# Patient Record
Sex: Male | Born: 1950
Health system: Southern US, Community
[De-identification: ages and names within clinical notes are randomized; demographics above are authoritative.]

## PROBLEM LIST (undated history)

## (undated) DIAGNOSIS — I2699 Other pulmonary embolism without acute cor pulmonale: Secondary | ICD-10-CM

## (undated) DIAGNOSIS — E7800 Pure hypercholesterolemia, unspecified: Secondary | ICD-10-CM

## (undated) HISTORY — DX: Other pulmonary embolism without acute cor pulmonale: I26.99

## (undated) HISTORY — PX: KNEE SURGERY: SHX244

## (undated) HISTORY — DX: Pure hypercholesterolemia, unspecified: E78.00

---

## 1998-12-16 ENCOUNTER — Ambulatory Visit (HOSPITAL_COMMUNITY): Admission: RE | Admit: 1998-12-16 | Discharge: 1998-12-16 | Payer: Self-pay | Admitting: *Deleted

## 2000-03-22 ENCOUNTER — Ambulatory Visit (HOSPITAL_BASED_OUTPATIENT_CLINIC_OR_DEPARTMENT_OTHER): Admission: RE | Admit: 2000-03-22 | Discharge: 2000-03-22 | Payer: Self-pay | Admitting: General Surgery

## 2009-12-10 ENCOUNTER — Encounter: Admission: RE | Admit: 2009-12-10 | Discharge: 2009-12-10 | Payer: Self-pay | Admitting: Family Medicine

## 2009-12-12 ENCOUNTER — Encounter
Admission: RE | Admit: 2009-12-12 | Discharge: 2010-01-07 | Payer: Self-pay | Source: Home / Self Care | Attending: Family Medicine | Admitting: Family Medicine

## 2013-03-30 ENCOUNTER — Ambulatory Visit
Admission: RE | Admit: 2013-03-30 | Discharge: 2013-03-30 | Disposition: A | Discharge status: Home / Self Care | Payer: BC Managed Care – PPO | Source: Ambulatory Visit | Attending: Family Medicine | Admitting: Family Medicine

## 2013-03-30 ENCOUNTER — Inpatient Hospital Stay (HOSPITAL_COMMUNITY)
Admission: EM | Admit: 2013-03-30 | Discharge: 2013-04-01 | DRG: 176 | Disposition: A | Discharge status: Home / Self Care | Payer: BC Managed Care – PPO | Attending: Internal Medicine | Admitting: Internal Medicine

## 2013-03-30 ENCOUNTER — Other Ambulatory Visit: Payer: Self-pay | Admitting: Family Medicine

## 2013-03-30 ENCOUNTER — Encounter (HOSPITAL_COMMUNITY): Payer: Self-pay | Admitting: Emergency Medicine

## 2013-03-30 DIAGNOSIS — R9389 Abnormal findings on diagnostic imaging of other specified body structures: Secondary | ICD-10-CM

## 2013-03-30 DIAGNOSIS — R05 Cough: Secondary | ICD-10-CM

## 2013-03-30 DIAGNOSIS — M549 Dorsalgia, unspecified: Secondary | ICD-10-CM

## 2013-03-30 DIAGNOSIS — R0781 Pleurodynia: Secondary | ICD-10-CM | POA: Diagnosis present

## 2013-03-30 DIAGNOSIS — Z832 Family history of diseases of the blood and blood-forming organs and certain disorders involving the immune mechanism: Secondary | ICD-10-CM

## 2013-03-30 DIAGNOSIS — R059 Cough, unspecified: Secondary | ICD-10-CM

## 2013-03-30 DIAGNOSIS — R071 Chest pain on breathing: Secondary | ICD-10-CM

## 2013-03-30 DIAGNOSIS — I2699 Other pulmonary embolism without acute cor pulmonale: Principal | ICD-10-CM | POA: Diagnosis present

## 2013-03-30 DIAGNOSIS — R509 Fever, unspecified: Secondary | ICD-10-CM

## 2013-03-30 DIAGNOSIS — R053 Chronic cough: Secondary | ICD-10-CM | POA: Diagnosis present

## 2013-03-30 LAB — BASIC METABOLIC PANEL
BUN: 13 mg/dL (ref 6–23)
CHLORIDE: 103 meq/L (ref 96–112)
CO2: 24 meq/L (ref 19–32)
Calcium: 9.1 mg/dL (ref 8.4–10.5)
Creatinine, Ser: 0.77 mg/dL (ref 0.50–1.35)
GFR calc Af Amer: >90 mL/min (ref >90)
GFR calc non Af Amer: >90 mL/min (ref >90)
GLUCOSE: 115 mg/dL — AB (ref 70–99)
POTASSIUM: 3.8 meq/L (ref 3.7–5.3)
SODIUM: 140 meq/L (ref 137–147)

## 2013-03-30 LAB — CBC
HEMATOCRIT: 36 % — AB (ref 39.0–52.0)
HEMOGLOBIN: 12.1 g/dL — AB (ref 13.0–17.0)
MCH: 29.3 pg (ref 26.0–34.0)
MCHC: 33.6 g/dL (ref 30.0–36.0)
MCV: 87.2 fL (ref 78.0–100.0)
Platelets: 208 10*3/uL (ref 150–400)
RBC: 4.13 MIL/uL — AB (ref 4.22–5.81)
RDW: 13 % (ref 11.5–15.5)
WBC: 7 10*3/uL (ref 4.0–10.5)

## 2013-03-30 LAB — TROPONIN I: Troponin I: <0.3 ng/mL (ref <0.30)

## 2013-03-30 MED ORDER — SODIUM CHLORIDE 0.9 % IJ SOLN
3.0000 mL | Freq: Two times a day (BID) | INTRAMUSCULAR | Status: DC
  Administered 2013-04-01: 3 mL via INTRAVENOUS

## 2013-03-30 MED ORDER — HYDROCODONE-ACETAMINOPHEN 5-325 MG PO TABS
1.0000 | ORAL_TABLET | ORAL | Status: DC | PRN
  Administered 2013-03-31: 1 via ORAL
  Filled 2013-03-30: qty 1

## 2013-03-30 MED ORDER — SODIUM CHLORIDE 0.9 % IV SOLN: 250.0000 mL | INTRAVENOUS | Status: DC | PRN

## 2013-03-30 MED ORDER — SODIUM CHLORIDE 0.9 % IJ SOLN: 3.0000 mL | INTRAMUSCULAR | Status: DC | PRN

## 2013-03-30 MED ORDER — IOHEXOL 300 MG/ML  SOLN
75.0000 mL | Freq: Once | INTRAMUSCULAR | Status: AC | PRN
  Administered 2013-03-30: 75 mL via INTRAVENOUS

## 2013-03-30 MED ORDER — ENOXAPARIN SODIUM 100 MG/ML ~~LOC~~ SOLN
90.0000 mg | Freq: Once | SUBCUTANEOUS | Status: AC
  Administered 2013-03-30: 90 mg via SUBCUTANEOUS
  Filled 2013-03-30: qty 1

## 2013-03-30 MED ORDER — SODIUM CHLORIDE 0.9 % IJ SOLN
3.0000 mL | Freq: Two times a day (BID) | INTRAMUSCULAR | Status: DC
  Administered 2013-03-31 – 2013-04-01 (×3): 3 mL via INTRAVENOUS

## 2013-03-30 MED ORDER — ENOXAPARIN SODIUM 100 MG/ML ~~LOC~~ SOLN
1.0000 mg/kg | Freq: Two times a day (BID) | SUBCUTANEOUS | Status: DC
  Filled 2013-03-30: qty 1

## 2013-03-30 MED ORDER — ENOXAPARIN SODIUM 100 MG/ML ~~LOC~~ SOLN
1.0000 mg/kg | Freq: Two times a day (BID) | SUBCUTANEOUS | Status: DC
  Administered 2013-03-31 – 2013-04-01 (×3): 90 mg via SUBCUTANEOUS
  Filled 2013-03-30 (×4): qty 1

## 2013-03-30 MED ORDER — IBUPROFEN 800 MG PO TABS
400.0000 mg | ORAL_TABLET | Freq: Four times a day (QID) | ORAL | Status: DC | PRN
  Administered 2013-03-31: 400 mg via ORAL
  Filled 2013-03-30: qty 1

## 2013-03-30 NOTE — ED Provider Notes (Signed)
CSN: [id number]     Arrival date & time 03/30/13  1943 History   First MD Initiated Contact with Patient 03/30/13 2113     Chief Complaint  Patient presents with  . Cough  . Back Pain  . pulmonary embolism      (Consider location/radiation/quality/duration/timing/severity/associated sxs/prior Treatment) HPI  History reviewed. No pertinent past medical history. Past Surgical History  Procedure Laterality Date  . Knee surgery      right   No family history on file. History  Substance Use Topics  . Smoking status: Never Smoker   . Smokeless tobacco: Never Used  . Alcohol Use: No    Review of Systems    Allergies  Penicillins  Home Medications   Current Outpatient Rx  Name  Route  Sig  Dispense  Refill  . B Complex-C (B-COMPLEX WITH VITAMIN C) tablet   Oral   Take 1 tablet by mouth daily.         Marland Kitchen ibuprofen (ADVIL,MOTRIN) 200 MG tablet   Oral   Take 400 mg by mouth every 6 (six) hours as needed for moderate pain.         . Multiple Vitamin (MULTIVITAMIN WITH MINERALS) TABS tablet   Oral   Take 1 tablet by mouth daily.         . multivitamin-lutein (OCUVITE-LUTEIN) CAPS capsule   Oral   Take 1 capsule by mouth daily.          BP 131/78  Pulse 75  Temp(Src) 99.1 F (37.3 C) (Oral)  Resp 22  Wt 195 lb (88.451 kg)  SpO2 98% Physical Exam  ED Course  Procedures (including critical care time) Labs Review Labs Reviewed  CBC - Abnormal; Notable for the following:    RBC 4.13 (*)    Hemoglobin 12.1 (*)    HCT 36.0 (*)    All other components within normal limits  BASIC METABOLIC PANEL - Abnormal; Notable for the following:    Glucose, Bld 115 (*)    All other components within normal limits  TROPONIN I   Imaging Review Dg Chest 2 View  03/30/2013   ADDENDUM REPORT: 03/30/2013 10:13  ADDENDUM: Correction to the impression:  The first impression should read "Streaky opacity at the left lung base with small left effusion. Cannot exclude  pneumonia. Recommend followup chest x-ray or CT of the chest. "  The second impression is correct.   Electronically Signed   By: Ivar Drape M.D.   On: 03/30/2013 10:13   03/30/2013   CLINICAL DATA:  Cough, intermittent fever for 6 weeks  EXAM: CHEST  2 VIEW  COMPARISON:  None.  FINDINGS: There is streaky opacity at the left lung base most consistent with atelectasis. The does appear to be a small left effusion, and developing pneumonia cannot be excluded. Followup is recommended. Also, there is a vague nodular opacity in the periphery of the right mid lung. This may represent a pleural plaque, but a lung nodule cannot be excluded. In view of this finding CT of the chest with IV contrast may be warranted. The right lung is clear. Mediastinal contours appear normal. The heart is within normal limits in size.  IMPRESSION: 1. Streaky opacity at the left lung base with small left effusion. Cannot exclude a body. Recommend followup. 2. Vague nodular opacity in the periphery of the right mid lung. Recommend CT of the chest with IV contrast to evaluate further.  Electronically Signed: By: Ivar Drape M.D. On:  03/30/2013 09:39   Ct Chest W Contrast  03/30/2013   CLINICAL DATA:  Fever with cough. Abnormal chest radiograph. Question pneumonia.  EXAM: CT CHEST WITH CONTRAST  TECHNIQUE: Multidetector CT imaging of the chest was performed during intravenous contrast administration.  CONTRAST:  51mL OMNIPAQUE IOHEXOL 300 MG/ML  SOLN  COMPARISON:  DG CHEST 2 VIEW dated 03/30/2013  FINDINGS: There are no enlarged mediastinal, hilar or axillary lymph nodes. There is very mild atherosclerosis. The heart size is normal.  Although not performed as a CTA, there is evidence of bilateral pulmonary embolism, more extensive on the right. The intravascular filling defects are somewhat ill-defined and peripheral and may be subacute. There are no signs of right heart strain.  There is no pericardial effusion. A small amount of pleural fluid is  present on the right. There are patchy airspace opacities at both lung bases which are primarily linear, likely representing atelectasis or scarring. There is some dependent high-density within the right lower lobe which could be due to chronic aspiration. Peripheral subpleural airspace disease within the superior segment of the right lower lobe is somewhat wedge-shaped and most likely represents a pulmonary infarct related to the patient's thromboembolic disease. There is no evidence of mass or endobronchial lesion.  The visualized upper abdomen appears unremarkable. There are no worrisome osseous findings.  IMPRESSION: 1. Acute or subacute bilateral pulmonary thromboembolic disease. 2. Pulmonary infarct in the right lower lobe. 3. Patchy airspace opacities at both lung bases are primarily linear and may reflect atelectasis or scarring. There is some dependent high-density in the right lower lobe which could be due to chronic aspiration. Radiographic follow up recommended. 4. Critical Value/emergent results were called by telephone at the time of interpretation on 03/30/2013 at 6:57 PM to Dr. Darcus Austin , who verbally acknowledged these results.   Electronically Signed   By: Camie Patience M.D.   On: 03/30/2013 18:58     EKG Interpretation   Date/Time:  Thursday March 30 2013 20:53:48 EST Ventricular Rate:  76 PR Interval:  180 QRS Duration: 105 QT Interval:  405 QTC Calculation: 455 R Axis:   58 Text Interpretation:  Sinus rhythm No old tracing to compare Confirmed by  CAMPOS  MD, Lennette Bihari ([id number]) on 03/30/2013 9:07:25 PM      MDM   Final diagnoses:  PE (pulmonary embolism)    Pt given lovenox.   I spoke to Dr. Shanon Brow who will admit    Fransico Meadow, PA-C 03/30/13 2259

## 2013-03-30 NOTE — ED Notes (Signed)
Pt states since end of December has had a dry cough, lower back pain, chills and fever on and off, states this past week the symptoms came back, states went to doctor and had chest xray done and CT scan, states doctor called and told him he had a PE and to come to the hospital, denies shortness of breath except when coughing, states is sore on R side when coughing.

## 2013-03-30 NOTE — H&P (Signed)
Chief Complaint:  Cough, cp  HPI: 63 yo healthy male went to see pcp for cough that has been waxing/waning for at least 4 months and worsening right sided chest pain posterior side radiates to anterior lower rt chest worse with inspiration was sent for cxr.  cxr was abnormal and recommended ct.  Ct scan was done as outpt which showed bilateral PE more on right than left.  Pt states this all started a couple days after he traveled to [place] (about 4 hour drive) the week after christmas.  He has been having this persistent cough, no hemoptysis.  Having chills occasionally, and sometimes fever for past 3 months.  No le edema, swelling or pain.  No major injuries or trauma.  No recent surgeries.  Pt has screening colonoscopy done last year he reports was normal.  Pt has 2 brothers and a nephew with h/o blood clots, he found out today that they all have Factor V leiden deficiency.  He also has a maternal grandfather who died of a massive blood clot in his 66s.  Pt has 3 children, 2 sons one daughter none of had blood clots yet.  Pt has no h/o vte, cva, or cad.  He did have knee surgery several years ago with no postoperative complications.  He did see his allergist within the last month for his cough, was given some prednisone which did not help.  Denies any sob.  Is very active, exercises up to 3 x a week on eliptical machine.  Review of Systems:  Positive and negative as per HPI otherwise all other systems are negative  Past Medical History: History reviewed. No pertinent past medical history. Past Surgical History  Procedure Laterality Date  . Knee surgery      right    Medications: Prior to Admission medications   Medication Sig Start Date End Date Taking? Authorizing Provider  B Complex-C (B-COMPLEX WITH VITAMIN C) tablet Take 1 tablet by mouth daily.   Yes Historical Provider, MD  ibuprofen (ADVIL,MOTRIN) 200 MG tablet Take 400 mg by mouth every 6 (six) hours as needed for moderate  pain.   Yes Historical Provider, MD  Multiple Vitamin (MULTIVITAMIN WITH MINERALS) TABS tablet Take 1 tablet by mouth daily.   Yes Historical Provider, MD  multivitamin-lutein (OCUVITE-LUTEIN) CAPS capsule Take 1 capsule by mouth daily.   Yes Historical Provider, MD    Allergies:   Allergies  Allergen Reactions  . Penicillins     "childhood reaction from mother"    Social History:  reports that he has never smoked. He has never used smokeless tobacco. He reports that he does not drink alcohol or use illicit drugs.  Family History: As above  Physical Exam: Filed Vitals:   03/30/13 1948 03/30/13 2058 03/30/13 2210 03/30/13 2219  BP: 131/78     Pulse: 83 75 75   Temp: 99.1 F (37.3 C)     TempSrc: Oral     Resp: 18 15 22    Weight:    88.451 kg (195 lb)  SpO2: 96% 100% 98%    General appearance: alert, cooperative and no distress Head: Normocephalic, without obvious abnormality, atraumatic Eyes: negative Nose: Nares normal. Septum midline. Mucosa normal. No drainage or sinus tenderness. Neck: no JVD and supple, symmetrical, trachea midline Lungs: clear to auscultation bilaterally Heart: regular rate and rhythm, S1, S2 normal, no murmur, click, rub or gallop Abdomen: soft, non-tender; bowel sounds normal; no masses,  no organomegaly Extremities: extremities normal, atraumatic, no cyanosis  or edema Pulses: 2+ and symmetric Skin: Skin color, texture, turgor normal. No rashes or lesions Neurologic: Grossly normal  Labs on Admission:   Recent Labs  03/30/13 2100  NA 140  K 3.8  CL 103  CO2 24  GLUCOSE 115*  BUN 13  CREATININE 0.77  CALCIUM 9.1    Recent Labs  03/30/13 2100  WBC 7.0  HGB 12.1*  HCT 36.0*  MCV 87.2  PLT 208    Recent Labs  03/30/13 2100  TROPONINI <0.30    Radiological Exams on Admission: Dg Chest 2 View  03/30/2013   ADDENDUM REPORT: 03/30/2013 10:13  ADDENDUM: Correction to the impression:  The first impression should read "Streaky  opacity at the left lung base with small left effusion. Cannot exclude pneumonia. Recommend followup chest x-ray or CT of the chest. "  The second impression is correct.   Electronically Signed   By: Ivar Drape M.D.   On: 03/30/2013 10:13   03/30/2013   CLINICAL DATA:  Cough, intermittent fever for 6 weeks  EXAM: CHEST  2 VIEW  COMPARISON:  None.  FINDINGS: There is streaky opacity at the left lung base most consistent with atelectasis. The does appear to be a small left effusion, and developing pneumonia cannot be excluded. Followup is recommended. Also, there is a vague nodular opacity in the periphery of the right mid lung. This may represent a pleural plaque, but a lung nodule cannot be excluded. In view of this finding CT of the chest with IV contrast may be warranted. The right lung is clear. Mediastinal contours appear normal. The heart is within normal limits in size.  IMPRESSION: 1. Streaky opacity at the left lung base with small left effusion. Cannot exclude a body. Recommend followup. 2. Vague nodular opacity in the periphery of the right mid lung. Recommend CT of the chest with IV contrast to evaluate further.  Electronically Signed: By: Ivar Drape M.D. On: 03/30/2013 09:39   Ct Chest W Contrast  03/30/2013   CLINICAL DATA:  Fever with cough. Abnormal chest radiograph. Question pneumonia.  EXAM: CT CHEST WITH CONTRAST  TECHNIQUE: Multidetector CT imaging of the chest was performed during intravenous contrast administration.  CONTRAST:  71mL OMNIPAQUE IOHEXOL 300 MG/ML  SOLN  COMPARISON:  DG CHEST 2 VIEW dated 03/30/2013  FINDINGS: There are no enlarged mediastinal, hilar or axillary lymph nodes. There is very mild atherosclerosis. The heart size is normal.  Although not performed as a CTA, there is evidence of bilateral pulmonary embolism, more extensive on the right. The intravascular filling defects are somewhat ill-defined and peripheral and may be subacute. There are no signs of right heart  strain.  There is no pericardial effusion. A small amount of pleural fluid is present on the right. There are patchy airspace opacities at both lung bases which are primarily linear, likely representing atelectasis or scarring. There is some dependent high-density within the right lower lobe which could be due to chronic aspiration. Peripheral subpleural airspace disease within the superior segment of the right lower lobe is somewhat wedge-shaped and most likely represents a pulmonary infarct related to the patient's thromboembolic disease. There is no evidence of mass or endobronchial lesion.  The visualized upper abdomen appears unremarkable. There are no worrisome osseous findings.  IMPRESSION: 1. Acute or subacute bilateral pulmonary thromboembolic disease. 2. Pulmonary infarct in the right lower lobe. 3. Patchy airspace opacities at both lung bases are primarily linear and may reflect atelectasis or scarring. There is  some dependent high-density in the right lower lobe which could be due to chronic aspiration. Radiographic follow up recommended. 4. Critical Value/emergent results were called by telephone at the time of interpretation on 03/30/2013 at 6:57 PM to Dr. Darcus Austin , who verbally acknowledged these results.   Electronically Signed   By: Camie Patience M.D.   On: 03/30/2013 18:58    Assessment/Plan  63 yo male with acute /subacute bilateral pulmonary emboli more on right than left with over 3 months of cough/pleuritic chest pain with probable factor V leiden deficiency   Principal Problem:   Pulmonary emboli-  Given his family history, will likely need lifelong anticoagulation. im actually surprised he has not clotted sooner.  Place on full dose lovenox.  Would be a good xaralto candidate if cost is not an issue.  sw will need to evaluate this prior to discharge.  hypercoag panel as been sent off prior to giving lovenox, but even if comes back negative he should probably still be on lifelong  anticoagulation.  Have advised him also that his children should get tested if indeed his 2 brothers and nephew have factor V leiden deficiency, as well as his younger brother who has not been tested yet.  vss all stable.  Place on tele.  Will require at least 48 hour hospitalization.  All of his cancer screening will need to make sure is up to date, his colon is, not sure about prostate.  Active Problems:   Pleuritic chest pain   Cough, persistent   FHx: factor V Leiden mutation  FULL CODE  DAVID,RACHAL A 03/30/2013, 11:36 PM

## 2013-03-30 NOTE — ED Notes (Signed)
Hospitalist at bedside 

## 2013-03-30 NOTE — ED Provider Notes (Signed)
CSN: [id number]     Arrival date & time 03/30/13  1943 History   None    Chief Complaint  Patient presents with  . Cough  . Back Pain  . pulmonary embolism      (Consider location/radiation/quality/duration/timing/severity/associated sxs/prior Treatment) Patient is a 63 y.o. male presenting with cough and back pain. The history is provided by the patient. No language interpreter was used.  Cough Cough characteristics:  Non-productive Severity:  Moderate Timing:  Constant Progression:  Worsening Chronicity:  New Smoker: no   Context: upper respiratory infection   Relieved by:  Nothing Worsened by:  Nothing tried Ineffective treatments:  None tried Associated symptoms: shortness of breath   Back Pain Pt reports he began having pain in his back on Monday.  Pt reports he has had a cough on and off since December.  Pt reports he was treated with antibiotics and prednisone.  Pt went to Md today who ordered chest xray which was abnormal.  Pt's pulse ox was low in office.   Pt sent for a ct which shows a PE.   Pt reports 2 brothers and a nephew have Factor V leiden.   Pt reports father died from a blood clot  History reviewed. No pertinent past medical history. Past Surgical History  Procedure Laterality Date  . Knee surgery      right   No family history on file. History  Substance Use Topics  . Smoking status: Never Smoker   . Smokeless tobacco: Never Used  . Alcohol Use: No    Review of Systems  Respiratory: Positive for cough and shortness of breath.   Musculoskeletal: Positive for back pain.  All other systems reviewed and are negative.      Allergies  Penicillins  Home Medications   Current Outpatient Rx  Name  Route  Sig  Dispense  Refill  . B Complex-C (B-COMPLEX WITH VITAMIN C) tablet   Oral   Take 1 tablet by mouth daily.         Marland Kitchen ibuprofen (ADVIL,MOTRIN) 200 MG tablet   Oral   Take 400 mg by mouth every 6 (six) hours as needed for moderate pain.          . Multiple Vitamin (MULTIVITAMIN WITH MINERALS) TABS tablet   Oral   Take 1 tablet by mouth daily.         . multivitamin-lutein (OCUVITE-LUTEIN) CAPS capsule   Oral   Take 1 capsule by mouth daily.          BP 131/78  Pulse 75  Temp(Src) 99.1 F (37.3 C) (Oral)  Resp 15  SpO2 100% Physical Exam  Nursing note and vitals reviewed. Constitutional: He is oriented to person, place, and time. He appears well-developed and well-nourished.  HENT:  Head: Normocephalic and atraumatic.  Eyes: Conjunctivae and EOM are normal. Pupils are equal, round, and reactive to light.  Neck: Normal range of motion.  Cardiovascular: Normal rate and normal heart sounds.   Pulmonary/Chest: Effort normal.  Abdominal: He exhibits no distension.  Musculoskeletal: Normal range of motion.  Neurological: He is alert and oriented to person, place, and time.  Skin: Skin is warm.  Psychiatric: He has a normal mood and affect.    ED Course  Procedures (including critical care time) Labs Review Labs Reviewed  CBC  BASIC METABOLIC PANEL   Imaging Review Dg Chest 2 View  03/30/2013   ADDENDUM REPORT: 03/30/2013 10:13  ADDENDUM: Correction to the impression:  The first  impression should read "Streaky opacity at the left lung base with small left effusion. Cannot exclude pneumonia. Recommend followup chest x-ray or CT of the chest. "  The second impression is correct.   Electronically Signed   By: Ivar Drape M.D.   On: 03/30/2013 10:13   03/30/2013   CLINICAL DATA:  Cough, intermittent fever for 6 weeks  EXAM: CHEST  2 VIEW  COMPARISON:  None.  FINDINGS: There is streaky opacity at the left lung base most consistent with atelectasis. The does appear to be a small left effusion, and developing pneumonia cannot be excluded. Followup is recommended. Also, there is a vague nodular opacity in the periphery of the right mid lung. This may represent a pleural plaque, but a lung nodule cannot be excluded. In  view of this finding CT of the chest with IV contrast may be warranted. The right lung is clear. Mediastinal contours appear normal. The heart is within normal limits in size.  IMPRESSION: 1. Streaky opacity at the left lung base with small left effusion. Cannot exclude a body. Recommend followup. 2. Vague nodular opacity in the periphery of the right mid lung. Recommend CT of the chest with IV contrast to evaluate further.  Electronically Signed: By: Ivar Drape M.D. On: 03/30/2013 09:39   Ct Chest W Contrast  03/30/2013   CLINICAL DATA:  Fever with cough. Abnormal chest radiograph. Question pneumonia.  EXAM: CT CHEST WITH CONTRAST  TECHNIQUE: Multidetector CT imaging of the chest was performed during intravenous contrast administration.  CONTRAST:  74mL OMNIPAQUE IOHEXOL 300 MG/ML  SOLN  COMPARISON:  DG CHEST 2 VIEW dated 03/30/2013  FINDINGS: There are no enlarged mediastinal, hilar or axillary lymph nodes. There is very mild atherosclerosis. The heart size is normal.  Although not performed as a CTA, there is evidence of bilateral pulmonary embolism, more extensive on the right. The intravascular filling defects are somewhat ill-defined and peripheral and may be subacute. There are no signs of right heart strain.  There is no pericardial effusion. A small amount of pleural fluid is present on the right. There are patchy airspace opacities at both lung bases which are primarily linear, likely representing atelectasis or scarring. There is some dependent high-density within the right lower lobe which could be due to chronic aspiration. Peripheral subpleural airspace disease within the superior segment of the right lower lobe is somewhat wedge-shaped and most likely represents a pulmonary infarct related to the patient's thromboembolic disease. There is no evidence of mass or endobronchial lesion.  The visualized upper abdomen appears unremarkable. There are no worrisome osseous findings.  IMPRESSION: 1. Acute or  subacute bilateral pulmonary thromboembolic disease. 2. Pulmonary infarct in the right lower lobe. 3. Patchy airspace opacities at both lung bases are primarily linear and may reflect atelectasis or scarring. There is some dependent high-density in the right lower lobe which could be due to chronic aspiration. Radiographic follow up recommended. 4. Critical Value/emergent results were called by telephone at the time of interpretation on 03/30/2013 at 6:57 PM to Dr. Darcus Austin , who verbally acknowledged these results.   Electronically Signed   By: Camie Patience M.D.   On: 03/30/2013 18:58     EKG Interpretation   Date/Time:  Thursday March 30 2013 20:53:48 EST Ventricular Rate:  76 PR Interval:  180 QRS Duration: 105 QT Interval:  405 QTC Calculation: 455 R Axis:   58 Text Interpretation:  Sinus rhythm No old tracing to compare Confirmed by  CAMPOS  MD, Caryn Bee ([id number]) on 03/30/2013 9:07:25 PM     Results for orders placed during the hospital encounter of 03/30/13  CBC      Result Value Ref Range   WBC 7.0  4.0 - 10.5 K/uL   RBC 4.13 (*) 4.22 - 5.81 MIL/uL   Hemoglobin 12.1 (*) 13.0 - 17.0 g/dL   HCT 90.2 (*) 40.9 - 73.5 %   MCV 87.2  78.0 - 100.0 fL   MCH 29.3  26.0 - 34.0 pg   MCHC 33.6  30.0 - 36.0 g/dL   RDW 32.9  92.4 - 26.8 %   Platelets 208  150 - 400 K/uL  BASIC METABOLIC PANEL      Result Value Ref Range   Sodium 140  137 - 147 mEq/L   Potassium 3.8  3.7 - 5.3 mEq/L   Chloride 103  96 - 112 mEq/L   CO2 24  19 - 32 mEq/L   Glucose, Bld 115 (*) 70 - 99 mg/dL   BUN 13  6 - 23 mg/dL   Creatinine, Ser 3.41  0.50 - 1.35 mg/dL   Calcium 9.1  8.4 - 96.2 mg/dL   GFR calc non Af Amer >90  >90 mL/min   GFR calc Af Amer >90  >90 mL/min   Dg Chest 2 View  03/30/2013   ADDENDUM REPORT: 03/30/2013 10:13  ADDENDUM: Correction to the impression:  The first impression should read "Streaky opacity at the left lung base with small left effusion. Cannot exclude pneumonia. Recommend followup  chest x-ray or CT of the chest. "  The second impression is correct.   Electronically Signed   By: Dwyane Dee M.D.   On: 03/30/2013 10:13   03/30/2013   CLINICAL DATA:  Cough, intermittent fever for 6 weeks  EXAM: CHEST  2 VIEW  COMPARISON:  None.  FINDINGS: There is streaky opacity at the left lung base most consistent with atelectasis. The does appear to be a small left effusion, and developing pneumonia cannot be excluded. Followup is recommended. Also, there is a vague nodular opacity in the periphery of the right mid lung. This may represent a pleural plaque, but a lung nodule cannot be excluded. In view of this finding CT of the chest with IV contrast may be warranted. The right lung is clear. Mediastinal contours appear normal. The heart is within normal limits in size.  IMPRESSION: 1. Streaky opacity at the left lung base with small left effusion. Cannot exclude a body. Recommend followup. 2. Vague nodular opacity in the periphery of the right mid lung. Recommend CT of the chest with IV contrast to evaluate further.  Electronically Signed: By: Dwyane Dee M.D. On: 03/30/2013 09:39   Ct Chest W Contrast  03/30/2013   CLINICAL DATA:  Fever with cough. Abnormal chest radiograph. Question pneumonia.  EXAM: CT CHEST WITH CONTRAST  TECHNIQUE: Multidetector CT imaging of the chest was performed during intravenous contrast administration.  CONTRAST:  6mL OMNIPAQUE IOHEXOL 300 MG/ML  SOLN  COMPARISON:  DG CHEST 2 VIEW dated 03/30/2013  FINDINGS: There are no enlarged mediastinal, hilar or axillary lymph nodes. There is very mild atherosclerosis. The heart size is normal.  Although not performed as a CTA, there is evidence of bilateral pulmonary embolism, more extensive on the right. The intravascular filling defects are somewhat ill-defined and peripheral and may be subacute. There are no signs of right heart strain.  There is no pericardial effusion. A small amount of pleural fluid is  present on the right. There  are patchy airspace opacities at both lung bases which are primarily linear, likely representing atelectasis or scarring. There is some dependent high-density within the right lower lobe which could be due to chronic aspiration. Peripheral subpleural airspace disease within the superior segment of the right lower lobe is somewhat wedge-shaped and most likely represents a pulmonary infarct related to the patient's thromboembolic disease. There is no evidence of mass or endobronchial lesion.  The visualized upper abdomen appears unremarkable. There are no worrisome osseous findings.  IMPRESSION: 1. Acute or subacute bilateral pulmonary thromboembolic disease. 2. Pulmonary infarct in the right lower lobe. 3. Patchy airspace opacities at both lung bases are primarily linear and may reflect atelectasis or scarring. There is some dependent high-density in the right lower lobe which could be due to chronic aspiration. Radiographic follow up recommended. 4. Critical Value/emergent results were called by telephone at the time of interpretation on 03/30/2013 at 6:57 PM to Dr. Darcus Austin , who verbally acknowledged these results.   Electronically Signed   By: Camie Patience M.D.   On: 03/30/2013 18:58    MDM   Final diagnoses:  PE (pulmonary embolism)       Fransico Meadow, PA-C 03/30/13 [place], [place] 03/30/13 2259

## 2013-03-30 NOTE — ED Notes (Signed)
Bed: WA16 Expected date:  Expected time:  Means of arrival:  Comments: TR 1 

## 2013-03-31 LAB — HOMOCYSTEINE: Homocysteine: 8.1 umol/L (ref 4.0–15.4)

## 2013-03-31 LAB — BASIC METABOLIC PANEL
BUN: 13 mg/dL (ref 6–23)
CHLORIDE: 102 meq/L (ref 96–112)
CO2: 23 mEq/L (ref 19–32)
Calcium: 9 mg/dL (ref 8.4–10.5)
Creatinine, Ser: 0.79 mg/dL (ref 0.50–1.35)
GFR calc non Af Amer: >90 mL/min (ref >90)
Glucose, Bld: 107 mg/dL — ABNORMAL HIGH (ref 70–99)
Potassium: 3.9 mEq/L (ref 3.7–5.3)
Sodium: 138 mEq/L (ref 137–147)

## 2013-03-31 LAB — LUPUS ANTICOAGULANT PANEL
DRVVT: 45.1 s — AB (ref <42.9)
LUPUS ANTICOAGULANT: NOT DETECTED
PTT Lupus Anticoagulant: 37.4 secs (ref 28.0–43.0)
dRVVT Incubated 1:1 Mix: 38.2 secs (ref <42.9)

## 2013-03-31 LAB — CBC
HEMATOCRIT: 37.1 % — AB (ref 39.0–52.0)
Hemoglobin: 12.2 g/dL — ABNORMAL LOW (ref 13.0–17.0)
MCH: 28.7 pg (ref 26.0–34.0)
MCHC: 32.9 g/dL (ref 30.0–36.0)
MCV: 87.3 fL (ref 78.0–100.0)
PLATELETS: 241 10*3/uL (ref 150–400)
RBC: 4.25 MIL/uL (ref 4.22–5.81)
RDW: 12.8 % (ref 11.5–15.5)
WBC: 6.6 10*3/uL (ref 4.0–10.5)

## 2013-03-31 LAB — PROTEIN C ACTIVITY: PROTEIN C ACTIVITY: 121 % (ref 75–133)

## 2013-03-31 LAB — FACTOR 5 LEIDEN

## 2013-03-31 LAB — BETA-2-GLYCOPROTEIN I ABS, IGG/M/A
BETA 2 GLYCO I IGG: 2 G Units (ref <20)
Beta-2-Glycoprotein I IgA: 37 A Units — ABNORMAL HIGH (ref <20)
Beta-2-Glycoprotein I IgM: 51 M Units — ABNORMAL HIGH (ref <20)

## 2013-03-31 LAB — CARDIOLIPIN ANTIBODIES, IGG, IGM, IGA
ANTICARDIOLIPIN IGA: 6 U/mL — AB (ref <22)
ANTICARDIOLIPIN IGM: 20 [MPL'U]/mL — AB (ref <11)
Anticardiolipin IgG: 4 GPL U/mL — ABNORMAL LOW (ref <23)

## 2013-03-31 LAB — PROTIME-INR
INR: 1.07 (ref 0.00–1.49)
PROTHROMBIN TIME: 13.7 s (ref 11.6–15.2)

## 2013-03-31 LAB — PROTEIN S, TOTAL: Protein S Ag, Total: 78 % (ref 60–150)

## 2013-03-31 LAB — PROTEIN C, TOTAL: Protein C, Total: 76 % (ref 72–160)

## 2013-03-31 LAB — PROTEIN S ACTIVITY: Protein S Activity: 91 % (ref 69–129)

## 2013-03-31 LAB — ANTITHROMBIN III: AntiThromb III Func: 113 % (ref 76–126)

## 2013-03-31 MED ORDER — BENZONATATE 100 MG PO CAPS
200.0000 mg | ORAL_CAPSULE | Freq: Three times a day (TID) | ORAL | Status: DC | PRN
  Filled 2013-03-31: qty 2

## 2013-03-31 MED ORDER — PATIENT'S GUIDE TO USING COUMADIN BOOK
Freq: Once | Status: DC
  Filled 2013-03-31: qty 1

## 2013-03-31 MED ORDER — COUMADIN BOOK
Freq: Once | Status: AC
  Administered 2013-03-31: 17:00:00
  Filled 2013-03-31: qty 1

## 2013-03-31 NOTE — Progress Notes (Signed)
Education provided to patient and spouse about giving Lovenox injection.  Patient able to successful demonstrate and give his own Lovenox injection. Spencer Ross

## 2013-03-31 NOTE — Progress Notes (Signed)
Lovenox coverage for pt checked:PT HAS TO MEET $5500-DEDUCTIBLE-PT HAS ONLY MET $468-AFTER DEDUCTIBLE IS MET PAYER WILL COVER _0 %

## 2013-03-31 NOTE — Progress Notes (Signed)
Patient ID: Spencer Ross, male   DOB: 1950/09/11, 63 y.o.   MRN: [id number]  [facility] PROGRESS NOTE  Spencer Ross DZH:[id number] DOB: [birth year] DOA: 03/30/2013 PCP: No PCP Per Patient  Brief narrative: 63 yo healthy male went to see pcp for cough that has been waxing/waning for at least 4 months and worsening right sided chest pain posterior side, radiates to anterior lower rt chest worse with inspiration, was sent for cxr. CXR was abnormal and recommended CT. Ct scan was done as outpt which showed bilateral PE more on right than left. Pt has rather strong family history of PE.   Principal Problem:   Pulmonary emboli - pt started on Lovenox and tolerating well - no signs of bleeding - discussed anticoagulation options upon discharge, details provided and pt verbalized understanding  - pt will make decision over the next 24 hours Active Problems:   Pleuritic chest pain - secondary to PE - continue analgesia as needed   Cough, persistent - possibly related to PE but ? Underlying PNA - will ask for sputum analysis - will consider ABX if no improvement    FHx: factor V Leiden mutation - blood work pending   Consultants:  None  Procedures/Studies: 03/30/2013 CXR Streaky opacity at the left lung base with small left effusion, ? PNA. Vague nodular opacity in the periphery of the right mid lung.   Ct Chest W Contrast   03/30/2013    1. Acute or subacute bilateral pulmonary thromboembolic disease.  2. Pulmonary infarct in the right lower lobe.  3. Patchy airspace opacities at both lung bases are primarily linear and may reflect atelectasis or scarring.  4. There is some dependent high-density in the right lower lobe which could be due to chronic aspiration. Radiographic follow up recommended.  Antibiotics:  None  Code Status: Full Family Communication: Pt and wife at bedside Disposition Plan: Home when medically stable  HPI/Subjective: No events overnight.    Objective: Filed Vitals:   03/30/13 2348 03/30/13 2353 03/31/13 0543 03/31/13 1455  BP: 133/79 123/84 116/75 127/76  Pulse: 77 85 52 73  Temp:   98.3 F (36.8 C) 98.4 F (36.9 C)  TempSrc:   Oral Oral  Resp:   20 18  Height:      Weight:      SpO2:   97% 97%    Intake/Output Summary (Last 24 hours) at 03/31/13 1657 Last data filed at 03/31/13 1600  Gross per 24 hour  Intake    500 ml  Output    650 ml  Net   -150 ml    Exam:   General:  Pt is alert, follows commands appropriately, not in acute distress  Cardiovascular: Regular rate and rhythm, S1/S2, no murmurs, no rubs, no gallops  Respiratory: Clear to auscultation bilaterally, no wheezing, no crackles, no rhonchi  Abdomen: Soft, non tender, non distended, bowel sounds present, no guarding  Extremities: No edema, pulses DP and PT palpable bilaterally  Neuro: Grossly nonfocal  Data Reviewed: Basic Metabolic Panel:  Recent Labs Lab 03/30/13 2100 03/31/13 0341  NA 140 138  K 3.8 3.9  CL 103 102  CO2 24 23  GLUCOSE 115* 107*  BUN 13 13  CREATININE 0.77 0.79  CALCIUM 9.1 9.0   CBC:  Recent Labs Lab 03/30/13 2100 03/31/13 0341  WBC 7.0 6.6  HGB 12.1* 12.2*  HCT 36.0* 37.1*  MCV 87.2 87.3  PLT 208 241   Cardiac Enzymes:  Recent Labs Lab  03/30/13 2100  TROPONINI <0.30   Scheduled Meds: . enoxaparin injection  1 mg/kg Subcutaneous Q12H  . sodium chloride  3 mL Intravenous Q12H  . sodium chloride  3 mL Intravenous Q12H   Continuous Infusions:  Faye Ramsay, MD  [facility] Pager [phone]  If 7PM-7AM, please contact night-coverage [email or web address] Password [email or web address] 03/31/2013, 4:57 PM   LOS: 1 day

## 2013-04-01 LAB — CBC
HCT: 39.7 % (ref 39.0–52.0)
Hemoglobin: 13 g/dL (ref 13.0–17.0)
MCH: 28.8 pg (ref 26.0–34.0)
MCHC: 32.7 g/dL (ref 30.0–36.0)
MCV: 87.8 fL (ref 78.0–100.0)
PLATELETS: 260 10*3/uL (ref 150–400)
RBC: 4.52 MIL/uL (ref 4.22–5.81)
RDW: 12.9 % (ref 11.5–15.5)
WBC: 7.5 10*3/uL (ref 4.0–10.5)

## 2013-04-01 LAB — BASIC METABOLIC PANEL
BUN: 14 mg/dL (ref 6–23)
CALCIUM: 9.3 mg/dL (ref 8.4–10.5)
CO2: 25 mEq/L (ref 19–32)
CREATININE: 0.84 mg/dL (ref 0.50–1.35)
Chloride: 102 mEq/L (ref 96–112)
Glucose, Bld: 103 mg/dL — ABNORMAL HIGH (ref 70–99)
Potassium: 4.1 mEq/L (ref 3.7–5.3)
Sodium: 139 mEq/L (ref 137–147)

## 2013-04-01 MED ORDER — RIVAROXABAN 15 MG PO TABS
15.0000 mg | ORAL_TABLET | Freq: Two times a day (BID) | ORAL | Status: DC
  Filled 2013-04-01 (×2): qty 1

## 2013-04-01 MED ORDER — RIVAROXABAN 15 MG PO TABS: 15.0000 mg | ORAL_TABLET | Freq: Two times a day (BID) | ORAL | Status: DC

## 2013-04-01 MED ORDER — RIVAROXABAN 20 MG PO TABS: 20.0000 mg | ORAL_TABLET | Freq: Every day | ORAL | Status: DC

## 2013-04-01 MED ORDER — RIVAROXABAN (XARELTO) EDUCATION KIT FOR DVT/PE PATIENTS
PACK | Freq: Once | Status: AC
  Administered 2013-04-01: 12:00:00
  Filled 2013-04-01: qty 1

## 2013-04-01 MED ORDER — BENZONATATE 200 MG PO CAPS
200.0000 mg | ORAL_CAPSULE | Freq: Two times a day (BID) | ORAL | Status: DC
Start: 2013-04-01 — End: 2013-05-18

## 2013-04-01 MED ORDER — BENZONATATE 100 MG PO CAPS
200.0000 mg | ORAL_CAPSULE | Freq: Two times a day (BID) | ORAL | Status: DC
  Administered 2013-04-01: 200 mg via ORAL
  Filled 2013-04-01 (×2): qty 2

## 2013-04-01 NOTE — Progress Notes (Signed)
Pharmacy Note - Changing Lovenox to Xarelto  A/P: Patient does not have any contraindications to using Xarelto. Patient received Lovenox 1mg /kg at 10am this AM. Will start Xarelto 15mg  BID x 21 days with first dose due tonight at 10pm. After finished with 3 weeks of BID dosing, start 20mg  once daily. Will counsel patient on [place], PharmD, BCPS Pager [phone] 04/01/2013 11:35 AM

## 2013-04-01 NOTE — Discharge Summary (Signed)
Physician Discharge Summary  Spencer Ross:[id number] DOB: Sep 20, 1950 DOA: 03/30/2013  PCP: Darcus Austin  Admit date: 03/30/2013 Discharge date: 04/01/2013  Recommendations for Outpatient Follow-up:  1. Pt will need to follow up with PCP in 2-3 weeks post discharge 2. Please obtain BMP to evaluate electrolytes and kidney function 3. Please also check CBC to evaluate Hg and Hct levels  Discharge Diagnoses: Pulmonary emboli  Principal Problem:   Pulmonary emboli Active Problems:   Pleuritic chest pain   Cough, persistent   FHx: factor V Leiden mutation  Discharge Condition: Stable  Diet recommendation: Heart healthy diet discussed in details   Brief narrative:  63 yo healthy male went to see pcp for cough that has been waxing/waning for at least 4 months and worsening right sided chest pain posterior side, radiates to anterior lower rt chest worse with inspiration, was sent for cxr. CXR was abnormal and recommended CT. Ct scan was done as outpt which showed bilateral PE more on right than left. Pt has rather strong family history of PE.   Principal Problem:  Pulmonary emboli  - pt started on Lovenox and tolerating well  - no signs of bleeding  - discussed anticoagulation options upon discharge, details provided and pt verbalized understanding  - pt opted for Xarelto and will need to see PCP for follow up to make sure no signs of bleeding  Active Problems:  Pleuritic chest pain  - secondary to PE  - continue analgesia as needed  Cough, persistent  - possibly related to PE but ? Underlying PNA - pt clinically improving and feels better this AM FHx: factor V Leiden mutation  - blood work pending, will need follow up   Consultants:  None Procedures/Studies:  03/30/2013 CXR Streaky opacity at the left lung base with small left effusion, ? PNA. Vague nodular opacity in the periphery of the right mid lung.  Ct Chest W Contrast 03/30/2013  1. Acute or subacute bilateral pulmonary  thromboembolic disease.  2. Pulmonary infarct in the right lower lobe.  3. Patchy airspace opacities at both lung bases are primarily linear and may reflect atelectasis or scarring.  4. There is some dependent high-density in the right lower lobe which could be due to chronic aspiration. Radiographic follow up recommended.  Antibiotics:  None  Code Status: Full  Family Communication: Pt and wife at bedside   Discharge Exam: Filed Vitals:   04/01/13 0455  BP: 108/71  Pulse: 58  Temp: 98.4 F (36.9 C)  Resp: 16   Filed Vitals:   03/31/13 0543 03/31/13 1455 03/31/13 2103 04/01/13 0455  BP: 116/75 127/76 120/79 108/71  Pulse: 52 73 68 58  Temp: 98.3 F (36.8 C) 98.4 F (36.9 C) 98 F (36.7 C) 98.4 F (36.9 C)  TempSrc: Oral Oral Oral Oral  Resp: 20 18 18 16   Height:      Weight:      SpO2: 97% 97% 97% 96%    General: Pt is alert, follows commands appropriately, not in acute distress Cardiovascular: Regular rate and rhythm, S1/S2 +, no murmurs, no rubs, no gallops Respiratory: Clear to auscultation bilaterally, no wheezing, no crackles, no rhonchi Abdominal: Soft, non tender, non distended, bowel sounds +, no guarding Extremities: no edema, no cyanosis, pulses palpable bilaterally DP and PT Neuro: Grossly nonfocal  Discharge Instructions     Medication List         B-complex with vitamin C tablet  Take 1 tablet by mouth daily.  benzonatate 200 MG capsule  Commonly known as:  TESSALON  Take 1 capsule (200 mg total) by mouth 2 (two) times daily.     ibuprofen 200 MG tablet  Commonly known as:  ADVIL,MOTRIN  Take 400 mg by mouth every 6 (six) hours as needed for moderate pain.     multivitamin with minerals Tabs tablet  Take 1 tablet by mouth daily.     multivitamin-lutein Caps capsule  Take 1 capsule by mouth daily.     Rivaroxaban 15 MG Tabs tablet  Commonly known as:  XARELTO  Take 1 tablet (15 mg total) by mouth 2 (two) times daily with a meal. Take  15 mg tablet twice daily until April 22, 2013. After that continue taking 20 mg tablet once daily     Rivaroxaban 20 MG Tabs tablet  Commonly known as:  XARELTO  Take 1 tablet (20 mg total) by mouth daily with supper. Start taking March 29th, 2015  Start taking on:  04/23/2013           Follow-up Information   Follow up with Faye Ramsay, MD On 04/05/2013. (Call my cell phone [phone])    Specialty:  Internal Medicine   Contact information:   [address] [place] [address] [phone]        The results of significant diagnostics from this hospitalization (including imaging, microbiology, ancillary and laboratory) are listed below for reference.     Microbiology: No results found for this or any previous visit (from the past 240 hour(s)).   Labs: Basic Metabolic Panel:  Recent Labs Lab 03/30/13 2100 03/31/13 0341 04/01/13 0404  NA 140 138 139  K 3.8 3.9 4.1  CL 103 102 102  CO2 24 23 25   GLUCOSE 115* 107* 103*  BUN 13 13 14   CREATININE 0.77 0.79 0.84  CALCIUM 9.1 9.0 9.3   CBC:  Recent Labs Lab 03/30/13 2100 03/31/13 0341 04/01/13 0404  WBC 7.0 6.6 7.5  HGB 12.1* 12.2* 13.0  HCT 36.0* 37.1* 39.7  MCV 87.2 87.3 87.8  PLT 208 241 260   Cardiac Enzymes:  Recent Labs Lab 03/30/13 2100  TROPONINI <0.30   SIGNED: Time coordinating discharge: Over 30 minutes  Faye Ramsay, MD  [facility] 04/01/2013, 1:47 PM Pager [phone]  If 7PM-7AM, please contact night-coverage [email or web address] Password [email or web address]

## 2013-04-01 NOTE — Discharge Instructions (Signed)
Information on my medicine - XARELTO (rivaroxaban)  This medication education was reviewed with me or my healthcare representative as part of my discharge preparation.  The pharmacist that spoke with me during my hospital stay was:  Emiliano Dyer, [facility]  WHY WAS [place] YOU? Xarelto was prescribed to treat blood clots that may have been found in the veins of your legs (deep vein thrombosis) or in your lungs (pulmonary embolism) and to reduce the risk of them occurring again.  What do you need to know about Xarelto? The starting dose is one 15 mg tablet taken TWICE daily with food for the FIRST 21 DAYS then on (enter date)  04/23/13  the dose is changed to one 20 mg tablet taken ONCE A DAY with your evening meal.  DO NOT stop taking Xarelto without talking to the health care provider who prescribed the medication.  Refill your prescription for 20 mg tablets before you run out.  After discharge, you should have regular check-up appointments with your healthcare provider that is prescribing your Xarelto.  In the future your dose may need to be changed if your kidney function changes by a significant amount.  What do you do if you miss a dose? If you are taking Xarelto TWICE DAILY and you miss a dose, take it as soon as you remember. You may take two 15 mg tablets (total 30 mg) at the same time then resume your regularly scheduled 15 mg twice daily the next day.  If you are taking Xarelto ONCE DAILY and you miss a dose, take it as soon as you remember on the same day then continue your regularly scheduled once daily regimen the next day. Do not take two doses of Xarelto at the same time.   Important Safety Information Xarelto is a blood thinner medicine that can cause bleeding. You should call your healthcare provider right away if you experience any of the following:   Bleeding from an injury or your nose that does not stop.   Unusual colored urine (red or dark brown) or  unusual colored stools (red or black).   Unusual bruising for unknown reasons.   A serious fall or if you hit your head (even if there is no bleeding).  Some medicines may interact with Xarelto and might increase your risk of bleeding while on Xarelto. To help avoid this, consult your healthcare provider or pharmacist prior to using any new prescription or non-prescription medications, including herbals, vitamins, non-steroidal anti-inflammatory drugs (NSAIDs) and supplements.  This website has more information on Xarelto: [email or web address]://guerra-benson.[email or web address]

## 2013-04-03 ENCOUNTER — Other Ambulatory Visit: Payer: BC Managed Care – PPO

## 2013-04-03 LAB — PROTHROMBIN GENE MUTATION

## 2013-04-05 ENCOUNTER — Ambulatory Visit: Payer: BC Managed Care – PPO | Attending: Internal Medicine | Admitting: Internal Medicine

## 2013-04-05 VITALS — BP 123/76 | HR 62 | Temp 98.7°F | Resp 16 | Ht 72.0 in | Wt 202.0 lb

## 2013-04-05 DIAGNOSIS — Z09 Encounter for follow-up examination after completed treatment for conditions other than malignant neoplasm: Secondary | ICD-10-CM | POA: Insufficient documentation

## 2013-04-05 DIAGNOSIS — Z86711 Personal history of pulmonary embolism: Secondary | ICD-10-CM | POA: Insufficient documentation

## 2013-04-05 DIAGNOSIS — I2699 Other pulmonary embolism without acute cor pulmonale: Secondary | ICD-10-CM

## 2013-04-05 LAB — CBC WITH DIFFERENTIAL/PLATELET
BASOS PCT: 1 % (ref 0–1)
Basophils Absolute: 0.1 10*3/uL (ref 0.0–0.1)
Eosinophils Absolute: 0.4 10*3/uL (ref 0.0–0.7)
Eosinophils Relative: 8 % — ABNORMAL HIGH (ref 0–5)
HEMATOCRIT: 38.9 % — AB (ref 39.0–52.0)
Hemoglobin: 13.2 g/dL (ref 13.0–17.0)
Lymphocytes Relative: 33 % (ref 12–46)
Lymphs Abs: 1.8 10*3/uL (ref 0.7–4.0)
MCH: 28.8 pg (ref 26.0–34.0)
MCHC: 33.9 g/dL (ref 30.0–36.0)
MCV: 84.9 fL (ref 78.0–100.0)
MONO ABS: 0.4 10*3/uL (ref 0.1–1.0)
Monocytes Relative: 7 % (ref 3–12)
NEUTROS ABS: 2.8 10*3/uL (ref 1.7–7.7)
NEUTROS PCT: 51 % (ref 43–77)
PLATELETS: 314 10*3/uL (ref 150–400)
RBC: 4.58 MIL/uL (ref 4.22–5.81)
RDW: 13.8 % (ref 11.5–15.5)
WBC: 5.5 10*3/uL (ref 4.0–10.5)

## 2013-04-05 NOTE — Progress Notes (Signed)
[facility]U Pt is here following up on his pulmonary embolism.

## 2013-04-05 NOTE — ED Provider Notes (Signed)
Medical screening examination/treatment/procedure(s) were performed by non-physician practitioner and as supervising physician I was immediately available for consultation/collaboration.   EKG Interpretation   Date/Time:  Thursday March 30 2013 20:53:48 EST Ventricular Rate:  76 PR Interval:  180 QRS Duration: 105 QT Interval:  405 QTC Calculation: 455 R Axis:   58 Text Interpretation:  Sinus rhythm No old tracing to compare Confirmed by  CAMPOS  MD, KEVIN ([id number]) on 03/30/2013 9:07:25 PM        Tanna Furry, MD 04/05/13 [phone]

## 2013-04-05 NOTE — Progress Notes (Signed)
Patient ID: JOB HOLTSCLAW, male   DOB: 1950/05/02, 63 y.o.   MRN: [id number] Came for CBC check as recently diagnose with PE and placed on Xarelto. Will follow up on blood test. Pt tolerating medication well. Will make his PCP aware.  Faye Ramsay, MD  [facility] Pager [phone]  If 7PM-7AM, please contact night-coverage [email or web address] Password [email or web address]

## 2013-04-06 NOTE — ED Provider Notes (Signed)
Medical screening examination/treatment/procedure(s) were performed by non-physician practitioner and as supervising physician I was immediately available for consultation/collaboration.   EKG Interpretation   Date/Time:  Thursday March 30 2013 20:53:48 EST Ventricular Rate:  76 PR Interval:  180 QRS Duration: 105 QT Interval:  405 QTC Calculation: 455 R Axis:   58 Text Interpretation:  Sinus rhythm No old tracing to compare Confirmed by  CAMPOS  MD, KEVIN ([id number]) on 03/30/2013 9:07:25 PM        Tanna Furry, MD 04/06/13 1052

## 2013-04-10 ENCOUNTER — Telehealth: Payer: Self-pay | Admitting: Internal Medicine

## 2013-04-10 NOTE — Telephone Encounter (Signed)
Pt. Would like to talk to Dr. Doyle Askew in regards to blood work, and visit on 04/05/13... Please call patient with results and outcome of visit.

## 2013-05-03 ENCOUNTER — Telehealth: Payer: Self-pay | Admitting: Internal Medicine

## 2013-05-03 NOTE — Telephone Encounter (Signed)
S/W PATIENT AND GAVE NEW PATIENT APPT FOR 04/23 @ 1:30 W/DR. CHISM.  REFERRING DR. Lujean Amel DX- HX OF BILATERAL PE; ? DIAGNOSIS WELCOME PACKET MAILED.

## 2013-05-03 NOTE — Telephone Encounter (Signed)
C/D 05/03/13 for appt. 05/18/13

## 2013-05-18 ENCOUNTER — Other Ambulatory Visit (HOSPITAL_BASED_OUTPATIENT_CLINIC_OR_DEPARTMENT_OTHER): Payer: BC Managed Care – PPO

## 2013-05-18 ENCOUNTER — Telehealth: Payer: Self-pay | Admitting: Internal Medicine

## 2013-05-18 ENCOUNTER — Other Ambulatory Visit: Payer: Self-pay | Admitting: Internal Medicine

## 2013-05-18 ENCOUNTER — Ambulatory Visit (HOSPITAL_BASED_OUTPATIENT_CLINIC_OR_DEPARTMENT_OTHER): Payer: BC Managed Care – PPO | Admitting: Internal Medicine

## 2013-05-18 ENCOUNTER — Ambulatory Visit: Payer: BC Managed Care – PPO

## 2013-05-18 VITALS — BP 118/71 | HR 62 | Temp 98.1°F | Resp 18 | Ht 72.0 in | Wt 204.0 lb

## 2013-05-18 DIAGNOSIS — I2699 Other pulmonary embolism without acute cor pulmonale: Secondary | ICD-10-CM

## 2013-05-18 DIAGNOSIS — R918 Other nonspecific abnormal finding of lung field: Secondary | ICD-10-CM

## 2013-05-18 DIAGNOSIS — Z7901 Long term (current) use of anticoagulants: Secondary | ICD-10-CM

## 2013-05-18 DIAGNOSIS — R05 Cough: Secondary | ICD-10-CM

## 2013-05-18 DIAGNOSIS — R053 Chronic cough: Secondary | ICD-10-CM

## 2013-05-18 DIAGNOSIS — Z832 Family history of diseases of the blood and blood-forming organs and certain disorders involving the immune mechanism: Secondary | ICD-10-CM

## 2013-05-18 LAB — COMPREHENSIVE METABOLIC PANEL (CC13)
ALBUMIN: 4.1 g/dL (ref 3.5–5.0)
ALK PHOS: 54 U/L (ref 40–150)
ALT: 15 U/L (ref 0–55)
AST: 16 U/L (ref 5–34)
Anion Gap: 12 mEq/L — ABNORMAL HIGH (ref 3–11)
BUN: 17.6 mg/dL (ref 7.0–26.0)
CHLORIDE: 106 meq/L (ref 98–109)
CO2: 26 mEq/L (ref 22–29)
Calcium: 9.7 mg/dL (ref 8.4–10.4)
Creatinine: 1 mg/dL (ref 0.7–1.3)
Glucose: 90 mg/dl (ref 70–140)
POTASSIUM: 4.1 meq/L (ref 3.5–5.1)
Sodium: 143 mEq/L (ref 136–145)
TOTAL PROTEIN: 7.5 g/dL (ref 6.4–8.3)
Total Bilirubin: 0.64 mg/dL (ref 0.20–1.20)

## 2013-05-18 LAB — CBC WITH DIFFERENTIAL/PLATELET
BASO%: 0.5 % (ref 0.0–2.0)
Basophils Absolute: 0 10*3/uL (ref 0.0–0.1)
EOS%: 3.1 % (ref 0.0–7.0)
Eosinophils Absolute: 0.2 10*3/uL (ref 0.0–0.5)
HCT: 43.3 % (ref 38.4–49.9)
HGB: 14 g/dL (ref 13.0–17.1)
LYMPH#: 1.7 10*3/uL (ref 0.9–3.3)
LYMPH%: 30.3 % (ref 14.0–49.0)
MCH: 29 pg (ref 27.2–33.4)
MCHC: 32.3 g/dL (ref 32.0–36.0)
MCV: 89.6 fL (ref 79.3–98.0)
MONO#: 0.4 10*3/uL (ref 0.1–0.9)
MONO%: 7.8 % (ref 0.0–14.0)
NEUT%: 58.3 % (ref 39.0–75.0)
NEUTROS ABS: 3.2 10*3/uL (ref 1.5–6.5)
Platelets: 207 10*3/uL (ref 140–400)
RBC: 4.83 10*6/uL (ref 4.20–5.82)
RDW: 14 % (ref 11.0–14.6)
WBC: 5.5 10*3/uL (ref 4.0–10.3)

## 2013-05-18 NOTE — Progress Notes (Signed)
[facility] [phone]   [phone]  NEW PATIENT EVALUATION   Name: Spencer Ross Date: 05/18/2013 MRN: [id number] DOB: March 13, 1950  PCP: [name], MD   REFERRING PHYSICIAN: [name], MD  REASON FOR REFERRAL:  Bilateral PEs (03/30/1013)  HISTORY OF PRESENT ILLNESS:Spencer Ross is a 63 y.o. male who is was admitted to [facility] on 03/05 and discharged of 04/01/2013 following treatment for bilateral PEs.  He is being referred by Dr. [name] of Primary Care for further evaluation.   On 03/04,2015, he initially saw his PCP's office for chronic cough (described as intermittent) occurring over the prior 4 months and eventually had worsening right-sided back pain, posterior side radiating to the anterior, lower right chest that was pleuritic.  He had a CXR on 03/05  as an outpatient which was abnormal and had questionable spot concerning for malignancy or pneumonia.   He was sent for an antibiotic for pnuemionia.  He was subsequently called and CT of chest as noted below which demonstrated bilateral PEs (right more than left).   He was instructed to report for admission.    He reports that his symptoms started a couple days after he traveled to [place] (about 4 hour drive) the week after christmas (01/23/2013). He had been having this persistent cough, no hemoptysis; Having chills occasionally, and sometimes fever for past 3 months. No leg edema, swelling or pain. No major injuries or trauma. No recent surgeries. Pt had a screening colonoscopy done last year that he reported was normal. He had 2 brothers and 1 nephew with h/o blood clots and one nephew with Factor V leiden defiency.  He also has a maternal grandfather who died of a massive blood clot in his 64s. Pt has 3 children, 2 sons one daughter none of had blood clots yet. His youngest son was tested and found positive as well for Factor V Leiden.  His other son was  negative.  His daughter has not been tested. Pt has no h/o vte, cva, or cad. He did have knee surgery several years ago with no postoperative complications. He was very active, exercises up to 3 x a week on eliptical machine.  In the hospital, he was started on heparin and was subsequently transioned to lovenox.  He was discharged Xarelto and reports compliance to it daily.  Today, he reports that the cough is still present.  He reports bad allergies over the past few weeks. He reports resolution of the back pain. He denies hemoptysis.  He denies hematuria, melena.  He works as a Nature conservation officer.  He reports a hernia that has worsened.  He also reports pain in his hips bilaterally.  He injured the left one about one year ago.  He also has knee athralgias.   PAST MEDICAL HISTORY:  has a past medical history of Pulmonary embolism.    PAST SURGICAL HISTORY: Past Surgical History  Procedure Laterality Date  . Knee surgery      right   CURRENT MEDICATIONS: has a current medication list which includes the following prescription(s): b-complex with vitamin c, multivitamin with minerals, multivitamin-lutein, rivaroxaban, and rivaroxaban.   ALLERGIES: Penicillins   SOCIAL HISTORY:  reports that he has never smoked. He has never used smokeless tobacco. He reports that he does not drink alcohol or use illicit drugs.   FAMILY HISTORY: family history includes Heart disease in his father.  Also as noted in the HPI.  LABORATORY DATA:  CBC    Component Value Date/Time   WBC 5.5 05/18/2013 1325   WBC 5.5 04/05/2013 1146   RBC 4.83 05/18/2013 1325   RBC 4.58 04/05/2013 1146   HGB 14.0 05/18/2013 1325   HGB 13.2 04/05/2013 1146   HCT 43.3 05/18/2013 1325   HCT 38.9* 04/05/2013 1146   PLT 207 05/18/2013 1325   PLT 314 04/05/2013 1146   MCV 89.6 05/18/2013 1325   MCV 84.9 04/05/2013 1146   MCH 29.0 05/18/2013 1325   MCH 28.8 04/05/2013 1146   MCHC 32.3 05/18/2013 1325   MCHC 33.9 04/05/2013 1146   RDW  14.0 05/18/2013 1325   RDW 13.8 04/05/2013 1146   LYMPHSABS 1.7 05/18/2013 1325   LYMPHSABS 1.8 04/05/2013 1146   MONOABS 0.4 05/18/2013 1325   MONOABS 0.4 04/05/2013 1146   EOSABS 0.2 05/18/2013 1325   EOSABS 0.4 04/05/2013 1146   BASOSABS 0.0 05/18/2013 1325   BASOSABS 0.1 04/05/2013 1146   CMP     Component Value Date/Time   NA 143 05/18/2013 1325   NA 139 04/01/2013 0404   K 4.1 05/18/2013 1325   K 4.1 04/01/2013 0404   CL 102 04/01/2013 0404   CO2 26 05/18/2013 1325   CO2 25 04/01/2013 0404   GLUCOSE 90 05/18/2013 1325   GLUCOSE 103* 04/01/2013 0404   BUN 17.6 05/18/2013 1325   BUN 14 04/01/2013 0404   CREATININE 1.0 05/18/2013 1325   CREATININE 0.84 04/01/2013 0404   CALCIUM 9.7 05/18/2013 1325   CALCIUM 9.3 04/01/2013 0404   PROT 7.5 05/18/2013 1325   ALBUMIN 4.1 05/18/2013 1325   AST 16 05/18/2013 1325   ALT 15 05/18/2013 1325   ALKPHOS 54 05/18/2013 1325   BILITOT 0.64 05/18/2013 1325   GFRNONAA >90 04/01/2013 0404   GFRAA >90 04/01/2013 0404   Results for [name], [name] (MRN [id number]) as of 05/18/2013 13:39  Ref. Range 03/30/2013 23:25  Anticardiolipin IgA Latest Range: <22 APL U/mL 6 (L)  Anticardiolipin IgG Latest Range: <23 GPL U/mL 4 (L)  Anticardiolipin IgM Latest Range: <11 MPL U/mL 20 (H)  PTT Lupus Anticoagulant Latest Range: 28.0-43.0 secs 37.4  PTTLA Confirmation Latest Range: <8.0 secs NOT APPL  PTTLA 4:1 Mix Latest Range: 28.0-43.0 secs NOT APPL  DRVVT Latest Range: <42.9 secs 45.1 (H)  Drvvt confirmation Latest Range: <1.11 Ratio NOT APPL  dRVVT Incubated 1:1 Mix Latest Range: <42.9 secs 38.2  Lupus Anticoagulant Latest Range: NOT DETECTED  NOT DETECTED  Beta-2 Glyco I IgG Latest Range: <20 G Units 2  Beta-2-Glycoprotein I IgA Latest Range: <20 A Units 37 (H)  Beta-2-Glycoprotein I IgM Latest Range: <20 M Units 51 (H)  AntiThromb III Func Latest Range: 76-126 % 113  Recommendations-F5LEID: No range found (NOTE)  Recommendations-PTGENE: No range found (NOTE)  Protein C Activity  Latest Range: 75-133 % 121  Protein C, Total Latest Range: 72-160 % 76  Protein S Activity Latest Range: 69-129 % 91  Protein S Ag, Total Latest Range: 60-150 % 78     RADIOGRAPHY: CT Chest W contrast 03/30/2013  CLINICAL DATA: Fever with cough. Abnormal chest radiograph. Question pneumonia.  EXAM: CT CHEST WITH CONTRAST TECHNIQUE:  Multidetector CT imaging of the chest was performed during intravenous contrast administration.  CONTRAST: 50mL OMNIPAQUE IOHEXOL 300 MG/ML SOLN COMPARISON: DG CHEST 2 VIEW dated 03/30/2013 FINDINGS:  There are no enlarged mediastinal, hilar or axillary lymph nodes. There is very mild atherosclerosis. The heart size is normal. Although not  performed as a CTA, there is evidence of bilateral pulmonary embolism, more extensive on the right. The intravascular filling defects are somewhat ill-defined and peripheral and may be subacute. There are no signs of right heart strain. There is no pericardial effusion. A small amount of pleural fluid is present on the right. There are patchy airspace opacities at both lung bases which are primarily linear, likely representing atelectasis or scarring. There is some dependent high-density within the right lower lobe which could be due to chronic aspiration. Peripheral subpleural airspace disease within the superior segment of the right lower lobe is somewhat wedge-shaped and most likely represents a pulmonary infarct related to the patient's thromboembolic disease. There is no evidence of mass or endobronchial lesion. The visualized upper abdomen appears unremarkable. There are no worrisome osseous findings. IMPRESSION: 1. Acute or subacute bilateral pulmonary thromboembolic disease. 2. Pulmonary infarct in the right lower lobe. 3. Patchy airspace opacities at both lung bases are primarily linear and may reflect atelectasis or scarring. There is some dependent high-density in the right lower lobe which could be due to chronic aspiration.  Radiographic follow up recommended.   REVIEW OF SYSTEMS:  Constitutional: Denies fevers, chills or abnormal weight loss Eyes: Denies blurriness of vision Ears, nose, mouth, throat, and face: Denies mucositis or sore throat Respiratory: Denies cough, dyspnea or wheezes Cardiovascular: Denies palpitation, chest discomfort or lower extremity swelling Gastrointestinal:  Denies nausea, heartburn or change in bowel habits Skin: Denies abnormal skin rashes Lymphatics: Denies new lymphadenopathy or easy bruising Neurological:Denies numbness, tingling or new weaknesses Behavioral/Psych: Mood is stable, no new changes  All other systems were reviewed with the patient and are negative.  PHYSICAL EXAM:  height is 6' (1.829 m) and weight is 204 lb (92.534 kg). His oral temperature is 98.1 F (36.7 C). His blood pressure is 118/71 and his pulse is 62. His respiration is 18 and oxygen saturation is 99%.    GENERAL:alert, no distress and comfortable; well developed and well nourished.  SKIN: skin color, texture, turgor are normal, no rashes or significant lesions EYES: normal, Conjunctiva are pink and non-injected, sclera clear OROPHARYNX:no exudate, no erythema and lips, buccal mucosa, and tongue normal  NECK: supple, thyroid normal size, non-tender, without nodularity LYMPH:  no palpable lymphadenopathy in the cervical, axillary or inguinal LUNGS: clear to auscultation and percussion with normal breathing effort HEART: regular rate & rhythm and no murmurs and no lower extremity edema ABDOMEN:abdomen soft, non-tender and normal bowel sounds Musculoskeletal:no cyanosis of digits and no clubbing  NEURO: alert & oriented x 3 with fluent speech, no focal motor/sensory deficits   IMPRESSION: Spencer Ross is a 63 y.o. male with a history of   PLAN:  1.  Bilateral PE, unprovoked (03/30/2013). + family history of clots  -- Patient denies any provoking factors for his PE.  We reviewed extensively his  hypercoagulable work-up which is so far negative and his CBC and chemistries today which are also negative.  However, he does have a strong family history of Factor V Leiden and blood clots.  We reviewed his imaging as noted above.  We discussed that his given his significant bilateral PE and family history, anticoagulation for at minimal six months versus indefinitely would be favored.  We will repeat hypercoagulable testing in six weeks and have him back for a brief visit to discuss the results.  He was instructed to continue taking Xalreto 20 mg daily.  He was provided a handout listing xalreto's side effect.  He denies any bleeding.  We discussed further recommendations regarding the duration of his treatment duration will based of the results of his repeat testing.   We provided a handout on Factor V Leiden and had an extensive discussion including that it is  the most common genetic risk factor for thrombosis and accounts for 94% of individuals classified as Activated Protein C (APC) resistant. Both heterozygotes and homozygotes are at risk for thrombosis, whichis 5-10 fold, and 50-100 fold respectively.  DNA testing for the V R2 polymorphism is recommended for Factor V Leiden heterozygotes. Presence of this polymorphism further increasesthe risk of venous thrombosis in Factor V Leiden heterozygotes. He was negative for Factor V mutation.   2. Dependent high-density in the right lower lobe, per CT of chest (03/30/2013) --Differential included chronic aspiration but radiographic follow up was recommended.  We will discuss repeating this scan next visit.   3. Follow up. --Patient instructed to have repeat labs done in six weeks and then follow up in 7 weeks.   All questions were answered. The patient knows to call the clinic with any problems, questions or concerns. We can certainly see the patient much sooner if necessary.  I spent 40 minutes counseling the patient face to face. The total time spent  in the appointment was 60 minutes.    Concha Norway, MD 05/18/2013 2:53 PM

## 2013-05-18 NOTE — Telephone Encounter (Signed)
GAve pt appt for lab and MD for June 2015

## 2013-05-18 NOTE — Patient Instructions (Signed)
Tamsulosin capsules What is this medicine? TAMSULOSIN (tam SOO loe sin) is used to treat enlargement of the prostate gland in men, a condition called benign prostatic hyperplasia or BPH. It is not for use in women. It works by relaxing muscles in the prostate and bladder neck. This improves urine flow and reduces BPH symptoms. This medicine may be used for other purposes; ask your health care provider or pharmacist if you have questions. COMMON BRAND NAME(S): Flomax What should I tell my health care provider before I take this medicine? They need to know if you have any of the following conditions: -advanced kidney disease -advanced liver disease -low blood pressure -prostate cancer -an unusual or allergic reaction to tamsulosin, sulfa drugs, other medicines, foods, dyes, or preservatives -pregnant or trying to get pregnant -breast-feeding How should I use this medicine? Take this medicine by mouth about 30 minutes after the same meal every day. Follow the directions on the prescription label. Swallow the capsules whole with a glass of water. Do not crush, chew, or open capsules. Do not take your medicine more often than directed. Do not stop taking your medicine unless your doctor tells you to. Talk to your pediatrician regarding the use of this medicine in children. Special care may be needed. Overdosage: If you think you have taken too much of this medicine contact a poison control center or emergency room at once. NOTE: This medicine is only for you. Do not share this medicine with others. What if I miss a dose? If you miss a dose, take it as soon as you can. If it is almost time for your next dose, take only that dose. Do not take double or extra doses. If you stop taking your medicine for several days or more, ask your doctor or health care professional what dose you should start back on. What may interact with this medicine? -cimetidine -fluoxetine -ketoconazole -medicines for  erectile disfunction like sildenafil, tadalafil, vardenafil -medicines for high blood pressure -other alpha-blockers like alfuzosin, doxazosin, phentolamine, phenoxybenzamine, prazosin, terazosin -warfarin This list may not describe all possible interactions. Give your health care provider a list of all the medicines, herbs, non-prescription drugs, or dietary supplements you use. Also tell them if you smoke, drink alcohol, or use illegal drugs. Some items may interact with your medicine. What should I watch for while using this medicine? Visit your doctor or health care professional for regular check ups. You will need lab work done before you start this medicine and regularly while you are taking it. Check your blood pressure as directed. Ask your health care professional what your blood pressure should be, and when you should contact him or her. This medicine may make you feel dizzy or lightheaded. This is more likely to happen after the first dose, after an increase in dose, or during hot weather or exercise. Drinking alcohol and taking some medicines can make this worse. Do not drive, use machinery, or do anything that needs mental alertness until you know how this medicine affects you. Do not sit or stand up quickly. If you begin to feel dizzy, sit down until you feel better. These effects can decrease once your body adjusts to the medicine. Contact your doctor or health care professional right away if you have an erection that lasts longer than 4 hours or if it becomes painful. This may be a sign of a serious problem and must be treated right away to prevent permanent damage. If you are thinking of having cataract   surgery, tell your eye surgeon that you have taken this medicine. What side effects may I notice from receiving this medicine? Side effects that you should report to your doctor or health care professional as soon as possible: -allergic reactions like skin rash or itching, hives, swelling  of the lips, mouth, tongue, or throat -breathing problems -change in vision -feeling faint or lightheaded -irregular heartbeat -prolonged or painful erection -weakness Side effects that usually do not require medical attention (report to your doctor or health care professional if they continue or are bothersome): -back pain -change in sex drive or performance -constipation, nausea or vomiting -cough -drowsy -runny or stuffy nose -trouble sleeping This list may not describe all possible side effects. Call your doctor for medical advice about side effects. You may report side effects to FDA at 1-800-FDA-1088. Where should I keep my medicine? Keep out of the reach of children. Store at room temperature between 15 and 30 degrees C (59 and 86 degrees F). Throw away any unused medicine after the expiration date. NOTE: This sheet is a summary. It may not cover all possible information. If you have questions about this medicine, talk to your doctor, pharmacist, or health care provider.  2014, [employer]. (2012-01-13 14:11:34) Terazosin capsules What is this medicine? TERAZOSIN (ter AY zoe sin) is an antihypertensive. It works by relaxing the blood vessels. It is used to treat benign prostatic hyperplasia (BPH) in men and to treat high blood pressure in both men and women. This medicine may be used for other purposes; ask your health care provider or pharmacist if you have questions. COMMON BRAND NAME(S): Hytrin What should I tell my health care provider before I take this medicine? They need to know if you have any of the following conditions: -an unusual or allergic reaction to terazosin, other medicines, foods, dyes, or preservatives -pregnant or trying to get pregnant -breast-feeding How should I use this medicine? Take this medicine by mouth with a glass of water. Follow the directions on the prescription label. You can take this medicine with or without food. Take your doses  at regular intervals. Do not take your medicine more often than directed. Do not stop taking except on the advice of your doctor or health care professional. Talk to your pediatrician regarding the use of this medicine in children. Special care may be needed. Overdosage: If you think you have taken too much of this medicine contact a poison control center or emergency room at once. NOTE: This medicine is only for you. Do not share this medicine with others. What if I miss a dose? If you miss a dose, take it as soon as you can. If it is almost time for your next dose, take only that dose. Do not take double or extra doses. What may interact with this medicine? -diuretics -medicines for high blood pressure -sildenafil -tadalafil -vardenafil This list may not describe all possible interactions. Give your health care provider a list of all the medicines, herbs, non-prescription drugs, or dietary supplements you use. Also tell them if you smoke, drink alcohol, or use illegal drugs. Some items may interact with your medicine. What should I watch for while using this medicine? Visit your doctor or health care professional for regular checks on your progress. Check your blood pressure regularly. Ask your doctor or health care professional what your blood pressure should be and when you should contact him or her. Drowsiness and dizziness are more likely to occur after the first dose, after  an increase in dose, or during hot weather or exercise. These effects can decrease once your body adjusts to this medicine. Do not drive, use machinery, or do anything that needs mental alertness until you know how this drug affects you. Do not stand or sit up quickly, especially if you are an older patient. This reduces the risk of dizzy or fainting spells. Alcohol can make you more drowsy and dizzy. Avoid alcoholic drinks. Do not treat yourself for coughs, colds, or pain while you are taking this medicine without asking  your doctor or health care professional for advice. Some ingredients may increase your blood pressure. Your mouth may get dry. Chewing sugarless gum or sucking hard candy, and drinking plenty of water may help. Contact your doctor if the problem does not go away or is severe. For males, contact your doctor or health care professional right away if you have an erection that lasts longer than 4 hours or if it becomes painful. This may be a sign of a serious problem and must be treated right away to prevent permanent damage. What side effects may I notice from receiving this medicine? Side effects that you should report to your doctor or health care professional as soon as possible: -blurred vision -difficulty breathing, shortness of breath -fainting spells, light headedness -fast or irregular heartbeat, palpitations or chest pain -males: prolonged or painful erection -swelling of the legs and ankles -unusually weak or tired Side effects that usually do not require medical attention (report to your doctor or health care professional if they continue or are bothersome): -headache -nausea -nasal stuffiness This list may not describe all possible side effects. Call your doctor for medical advice about side effects. You may report side effects to FDA at 1-800-FDA-1088. Where should I keep my medicine? Keep out of the reach of children. Store at room temperature between 20 and 25 degrees C (68 and 77 degrees F). Higher temperatures may cause the capsules to soften or melt. Protect from light and moisture. Throw away any unused medicine after the expiration date. NOTE: This sheet is a summary. It may not cover all possible information. If you have questions about this medicine, talk to your doctor, pharmacist, or health care provider.  2014, [employer]. (2012-01-13 14:07:39) Benign Prostatic Hyperplasia An enlarged prostate (benign prostatic hyperplasia) is common in older men. You may  experience the following:  Weak urine stream.  Dribbling.  Feeling like the bladder has not emptied completely.  Difficulty starting urination.  Getting up frequently at night to urinate.  Urinating more frequently during the day. HOME CARE INSTRUCTIONS  Monitor your prostatic hyperplasia for any changes. The following actions may help to alleviate any discomfort you are experiencing:  Give yourself time when you urinate.  Stay away from alcohol.  Avoid beverages containing caffeine, such as coffee, tea, and colas, because they can make the problem worse.  Avoid decongestants, antihistamines, and some prescription medicines that can make the problem worse.  Follow up with your health care provider for further treatment as recommended. SEEK MEDICAL CARE IF:  You are experiencing progressive difficulty voiding.  Your urine stream is progressively getting narrower.  You are awaking from sleep with the urge to void more frequently.  You are constantly feeling the need to void.  You experience loss of urine, especially in small amounts. SEEK IMMEDIATE MEDICAL CARE IF:   You develop increased pain with urination or are unable to urinate.  You develop severe abdominal pain, vomiting, a high  fever, or fainting.  You develop back pain or blood in your urine. MAKE SURE YOU:   Understand these instructions.  Will watch your condition.  Will get help right away if you are not doing well or get worse. Document Released: 01/12/2005 Document Revised: 09/14/2012 Document Reviewed: 06/14/2012 [facility] Patient Information 2014 [address] [place] [address]  This test is done to determine whether you have an inherited gene mutation that increases your risk of developing venous blood clots. This test is done when you have had an unexplained clotting episode, especially when you are relatively young (less than 8 years old) and have no other identified risk  factors. PREPARATION FOR TEST A blood sample is obtained by inserting a needle into a vein in the arm. NORMAL FINDINGS No genetic defect is found (negative). Ranges for normal findings may vary among different laboratories and hospitals. You should always check with your doctor after having lab work or other tests done to discuss the meaning of your test results and whether your values are considered within normal limits. MEANING OF TEST  Your caregiver will go over the test results with you and discuss the importance and meaning of your results, as well as treatment options and the need for additional tests if necessary. OBTAINING THE TEST RESULTS  It is your responsibility to obtain your test results. Ask the lab or department performing the test when and how you will get your results. Document Released: 02/15/2004 Document Revised: 04/06/2011 Document Reviewed: 12/23/2007 [facility] Patient Information 2014 [place], [place]. Rivaroxaban oral tablets What is this medicine? RIVAROXABAN (ri va ROX a ban) is an anticoagulant (blood thinner). It is used to treat blood clots in the lungs or in the veins. It is also used after knee or hip surgeries to prevent blood clots. It is also used to lower the chance of stroke in people with a medical condition called atrial fibrillation. This medicine may be used for other purposes; ask your health care provider or pharmacist if you have questions. COMMON BRAND NAME(S): Xarelto What should I tell my health care provider before I take this medicine? They need to know if you have any of these conditions: -bleeding disorders -bleeding in the brain -blood in your stools (black or tarry stools) or if you have blood in your vomit -history of stomach bleeding -kidney disease -liver disease -low blood counts, like low white cell, platelet, or red cell counts -recent or planned spinal or epidural procedure -take medicines that treat or prevent blood clots -an  unusual or allergic reaction to rivaroxaban, other medicines, foods, dyes, or preservatives -pregnant or trying to get pregnant -breast-feeding How should I use this medicine? Take this medicine by mouth with a glass of water. Follow the directions on the prescription label. Take your medicine at regular intervals. Do not take it more often than directed. Do not stop taking except on your doctor's advice. Stopping this medicine may increase your risk of a blot clot. Be sure to refill your prescription before you run out of medicine. If you are taking this medicine after hip or knee replacement surgery, take it with or without food. If you are taking this medicine for atrial fibrillation, take it with your evening meal. If you are taking this medicine to treat blood clots, take it with food at the same time each day. If you are unable to swallow your tablet, you may crush the tablet and mix it in applesauce. Then, immediately eat the applesauce. You  should eat more food right after you eat the applesauce containing the crushed tablet. Talk to your pediatrician regarding the use of this medicine in children. Special care may be needed. Overdosage: If you think you have taken too much of this medicine contact a poison control center or emergency room at once. NOTE: This medicine is only for you. Do not share this medicine with others. What if I miss a dose? If you take your medicine once a day and miss a dose, take the missed dose as soon as you remember. If you take your medicine twice a day and miss a dose, take the missed dose immediately. In this instance, 2 tablets may be taken at the same time. The next day you should take 1 tablet twice a day as directed. What may interact with this medicine? -aspirin and aspirin-like medicines -certain antibiotics like erythromycin, azithromycin, and clarithromycin -certain medicines for fungal infections like ketoconazole and itraconazole -certain medicines for  irregular heart beat like amiodarone, quinidine, dronedarone -certain medicines for seizures like carbamazepine, phenytoin -certain medicines that treat or prevent blood clots like warfarin, enoxaparin, and dalteparin  -conivaptan -diltiazem -felodipine -indinavir -lopinavir; ritonavir -NSAIDS, medicines for pain and inflammation, like ibuprofen or naproxen -ranolazine -rifampin -ritonavir -St. John's wort -verapamil This list may not describe all possible interactions. Give your health care provider a list of all the medicines, herbs, non-prescription drugs, or dietary supplements you use. Also tell them if you smoke, drink alcohol, or use illegal drugs. Some items may interact with your medicine. What should I watch for while using this medicine? Visit your doctor or health care professional for regular checks on your progress. Your condition will be monitored carefully while you are receiving this medicine. Notify your doctor or health care professional and seek emergency treatment if you develop breathing problems; changes in vision; chest pain; severe, sudden headache; pain, swelling, warmth in the leg; trouble speaking; sudden numbness or weakness of the face, arm, or leg. These can be signs that your condition has gotten worse. If you are going to have surgery, tell your doctor or health care professional that you are taking this medicine. Tell your health care professional that you use this medicine before you have a spinal or epidural procedure. Sometimes people who take this medicine have bleeding problems around the spine when they have a spinal or epidural procedure. This bleeding is very rare. If you have a spinal or epidural procedure while on this medicine, call your health care professional immediately if you have back pain, numbness or tingling (especially in your legs and feet), muscle weakness, paralysis, or loss of bladder or bowel control. Avoid sports and activities that  might cause injury while you are using this medicine. Severe falls or injuries can cause unseen bleeding. Be careful when using sharp tools or knives. Consider using an Copy. Take special care brushing or flossing your teeth. Report any injuries, bruising, or red spots on the skin to your doctor or health care professional. What side effects may I notice from receiving this medicine? Side effects that you should report to your doctor or health care professional as soon as possible: -allergic reactions like skin rash, itching or hives, swelling of the face, lips, or tongue -back pain -redness, blistering, peeling or loosening of the skin, including inside the mouth -signs and symptoms of bleeding such as bloody or black, tarry stools; red or dark-brown urine; spitting up blood or brown material that looks like coffee grounds; red spots  on the skin; unusual bruising or bleeding from the eye, gums, or nose  Side effects that usually do not require medical attention (Report these to your doctor or health care professional if they continue or are bothersome.): -dizziness -muscle pain This list may not describe all possible side effects. Call your doctor for medical advice about side effects. You may report side effects to FDA at 1-800-FDA-1088. Where should I keep my medicine? Keep out of the reach of children. Store at room temperature between 15 and 30 degrees C (59 and 86 degrees F). Throw away any unused medicine after the expiration date. NOTE: This sheet is a summary. It may not cover all possible information. If you have questions about this medicine, talk to your doctor, pharmacist, or health care provider.  2014, [employer] Standard. (2012-06-29 09:51:31)

## 2013-05-24 ENCOUNTER — Telehealth: Payer: Self-pay | Admitting: Internal Medicine

## 2013-05-24 NOTE — Telephone Encounter (Signed)
talked to pt and gave him new appt schedule for lab and MD on June r/s due to MD [facility]

## 2013-06-29 ENCOUNTER — Other Ambulatory Visit: Payer: BC Managed Care – PPO

## 2013-07-06 ENCOUNTER — Ambulatory Visit: Payer: BC Managed Care – PPO

## 2013-07-14 ENCOUNTER — Other Ambulatory Visit (HOSPITAL_BASED_OUTPATIENT_CLINIC_OR_DEPARTMENT_OTHER): Payer: BC Managed Care – PPO

## 2013-07-14 DIAGNOSIS — Z832 Family history of diseases of the blood and blood-forming organs and certain disorders involving the immune mechanism: Secondary | ICD-10-CM

## 2013-07-14 DIAGNOSIS — I2699 Other pulmonary embolism without acute cor pulmonale: Secondary | ICD-10-CM

## 2013-07-14 LAB — BASIC METABOLIC PANEL (CC13)
ANION GAP: 8 meq/L (ref 3–11)
BUN: 13.6 mg/dL (ref 7.0–26.0)
CO2: 27 mEq/L (ref 22–29)
Calcium: 9.4 mg/dL (ref 8.4–10.4)
Chloride: 108 mEq/L (ref 98–109)
Creatinine: 0.9 mg/dL (ref 0.7–1.3)
Glucose: 76 mg/dl (ref 70–140)
Potassium: 4.1 mEq/L (ref 3.5–5.1)
SODIUM: 143 meq/L (ref 136–145)

## 2013-07-14 LAB — CBC WITH DIFFERENTIAL/PLATELET
BASO%: 0.5 % (ref 0.0–2.0)
Basophils Absolute: 0 10*3/uL (ref 0.0–0.1)
EOS%: 5.1 % (ref 0.0–7.0)
Eosinophils Absolute: 0.2 10*3/uL (ref 0.0–0.5)
HCT: 42.2 % (ref 38.4–49.9)
HGB: 14 g/dL (ref 13.0–17.1)
LYMPH%: 33.7 % (ref 14.0–49.0)
MCH: 29.5 pg (ref 27.2–33.4)
MCHC: 33.2 g/dL (ref 32.0–36.0)
MCV: 89 fL (ref 79.3–98.0)
MONO#: 0.3 10*3/uL (ref 0.1–0.9)
MONO%: 8.9 % (ref 0.0–14.0)
NEUT#: 1.9 10*3/uL (ref 1.5–6.5)
NEUT%: 51.8 % (ref 39.0–75.0)
PLATELETS: 171 10*3/uL (ref 140–400)
RBC: 4.74 10*6/uL (ref 4.20–5.82)
RDW: 13.8 % (ref 11.0–14.6)
WBC: 3.7 10*3/uL — ABNORMAL LOW (ref 4.0–10.3)
lymph#: 1.3 10*3/uL (ref 0.9–3.3)

## 2013-07-17 ENCOUNTER — Telehealth: Payer: Self-pay | Admitting: Internal Medicine

## 2013-07-17 ENCOUNTER — Ambulatory Visit (HOSPITAL_BASED_OUTPATIENT_CLINIC_OR_DEPARTMENT_OTHER): Payer: BC Managed Care – PPO | Admitting: Internal Medicine

## 2013-07-17 VITALS — BP 100/70 | HR 68 | Temp 98.4°F | Resp 18 | Ht 72.0 in | Wt 200.0 lb

## 2013-07-17 DIAGNOSIS — Z832 Family history of diseases of the blood and blood-forming organs and certain disorders involving the immune mechanism: Secondary | ICD-10-CM

## 2013-07-17 DIAGNOSIS — R0781 Pleurodynia: Secondary | ICD-10-CM

## 2013-07-17 DIAGNOSIS — I2699 Other pulmonary embolism without acute cor pulmonale: Secondary | ICD-10-CM

## 2013-07-17 DIAGNOSIS — R071 Chest pain on breathing: Secondary | ICD-10-CM

## 2013-07-17 DIAGNOSIS — J984 Other disorders of lung: Secondary | ICD-10-CM

## 2013-07-17 NOTE — Telephone Encounter (Signed)
Gave pt appt for lab and MD for for December 17th date per pt rqst

## 2013-07-17 NOTE — Progress Notes (Signed)
[facility] OFFICE PROGRESS NOTE  [name], MD [place] [address]  DIAGNOSIS: Pulmonary emboli  FHx: factor V Leiden mutation  Pleuritic chest pain  Chief Complaint  Patient presents with  . Pulmonary emboli    CURRENT TREATMENT: Xalreto 20 mg daily.   INTERVAL HISTORY: Spencer Ross 63 y.o. male who is was admitted to [facility] on 03/05 and discharged of 04/01/2013 following treatment for bilateral PEs. He was being referred by Dr. [name] of Primary Care for further evaluation and seen initially on 05/18/2013.   On 03/04,2015, he initially saw his PCP's office for chronic cough (described as intermittent) occurring over the prior 4 months and eventually had worsening right-sided back pain, posterior side radiating to the anterior, lower right chest that was pleuritic. He had a CXR on 03/05 as an outpatient which was abnormal and had questionable spot concerning for malignancy or pneumonia. He was sent for an antibiotic for pnuemionia. He was subsequently called and CT of chest as noted below which demonstrated bilateral PEs (right more than left). He was instructed to report for admission.   He reports that his symptoms started a couple days after he traveled to [place] (about 4 [place] drive) the week after christmas (01/23/2013). He had been having this persistent cough, no hemoptysis; Having chills occasionally, and sometimes fever for past 3 months. No leg edema, swelling or pain. No major injuries or trauma. No recent surgeries. Pt had a screening colonoscopy done last year that he reported was normal. He had 2 brothers and 1 nephew with h/o blood clots and one nephew with Factor V leiden defiency. He also has a maternal grandfather who died of a massive blood clot in his 26s. Pt has 3 children, 2 sons one daughter none of had blood clots yet. His youngest son was tested and found positive as well for Factor V Leiden.  His other son was negative. His daughter has not been tested. Pt has no h/o vte, cva, or cad. He did have knee surgery several years ago with no postoperative complications. He was very active, exercises up to 3 x a week on eliptical machine. In the hospital, he was started on heparin and was subsequently transioned to lovenox. He was discharged Xarelto and reports compliance to it daily.  Today, he reports resolution of the back pain. He denies hemoptysis. He denies hematuria, melena. He works as a Nature conservation officer. He reports a hernia that has worsened. He also reports pain in his hips bilaterally. He injured the left one about one year ago. He also has knee athralgias.   MEDICAL HISTORY: Past Medical History  Diagnosis Date  . Pulmonary embolism     INTERIM HISTORY: has Pulmonary emboli; Pleuritic chest pain; Cough, persistent; and FHx: factor V Leiden mutation on his problem list.    ALLERGIES:  is allergic to penicillins.  MEDICATIONS: has a current medication list which includes the following prescription(s): b-complex with vitamin c, multivitamin with minerals, multivitamin-lutein, rivaroxaban, and rivaroxaban.  SURGICAL HISTORY:  Past Surgical History  Procedure Laterality Date  . Knee surgery      right    REVIEW OF SYSTEMS:   Constitutional: Denies fevers, chills or abnormal weight loss Eyes: Denies blurriness of vision Ears, nose, mouth, throat, and face: Denies mucositis or sore throat Respiratory: Denies cough, dyspnea or wheezes Cardiovascular: Denies palpitation, chest discomfort or lower extremity swelling Gastrointestinal:  Denies nausea, heartburn or change in bowel habits  Skin: Denies abnormal skin rashes Lymphatics: Denies new lymphadenopathy or easy bruising Neurological:Denies numbness, tingling or new weaknesses Behavioral/Psych: Mood is stable, no new changes  All other systems were reviewed with the patient and are negative.  PHYSICAL EXAMINATION: ECOG  PERFORMANCE STATUS: 0 - Asymptomatic  Blood pressure 100/70, pulse 68, temperature 98.4 F (36.9 C), temperature source Oral, resp. rate 18, height 6' (1.829 m), weight 200 lb (90.719 kg), SpO2 100.00%.  GENERAL:alert, no distress and comfortable SKIN: skin color, texture, turgor are normal, no rashes or significant lesions EYES: normal, Conjunctiva are pink and non-injected, sclera clear OROPHARYNX:no exudate, no erythema and lips, buccal mucosa, and tongue normal  NECK: supple, thyroid normal size, non-tender, without nodularity LYMPH:  no palpable lymphadenopathy in the cervical, axillary or supraclavicular LUNGS: clear to auscultation with normal breathing effort, no wheezes or rhonchi HEART: regular rate & rhythm and no murmurs and no lower extremity edema ABDOMEN:abdomen soft, non-tender and normal bowel sounds Musculoskeletal:no cyanosis of digits and no clubbing  NEURO: alert & oriented x 3 with fluent speech, no focal motor/sensory deficits  Labs:  Lab Results  Component Value Date   WBC 3.7* 07/14/2013   HGB 14.0 07/14/2013   HCT 42.2 07/14/2013   MCV 89.0 07/14/2013   PLT 171 07/14/2013   NEUTROABS 1.9 07/14/2013      Chemistry      Component Value Date/Time   NA 143 07/14/2013 1038   NA 139 04/01/2013 0404   K 4.1 07/14/2013 1038   K 4.1 04/01/2013 0404   CL 102 04/01/2013 0404   CO2 27 07/14/2013 1038   CO2 25 04/01/2013 0404   BUN 13.6 07/14/2013 1038   BUN 14 04/01/2013 0404   CREATININE 0.9 07/14/2013 1038   CREATININE 0.84 04/01/2013 0404      Component Value Date/Time   CALCIUM 9.4 07/14/2013 1038   CALCIUM 9.3 04/01/2013 0404   ALKPHOS 54 05/18/2013 1325   AST 16 05/18/2013 1325   ALT 15 05/18/2013 1325   BILITOT 0.64 05/18/2013 1325       Basic Metabolic Panel:  Recent Labs Lab 07/14/13 1038  NA 143  K 4.1  CO2 27  GLUCOSE 76  BUN 13.6  CREATININE 0.9  CALCIUM 9.4   CBC:  Recent Labs Lab 07/14/13 1037  WBC 3.7*  NEUTROABS 1.9  HGB 14.0  HCT 42.2  MCV  89.0  PLT 171    Anemia work up No results found for this basename: VITAMINB12, FOLATE, FERRITIN, TIBC, IRON, RETICCTPCT,  in the last 72 hours  Studies:  No results found.   RADIOGRAPHIC STUDIES: No results found.  ASSESSMENT: Spencer Ross 63 y.o. male with a history of Pulmonary emboli  FHx: factor V Leiden mutation  Pleuritic chest pain   PLAN:  1. Bilateral PE, unprovoked (03/30/2013). + family history of clots  -- Patient denies any provoking factors for his PE. We reviewed extensively his hypercoagulable work-up which is so far negative and his CBC and chemistries today which are also negative. However, he does have a strong family history of Factor V Leiden and blood clots. We reviewed his imaging as noted above. We discussed that his given his significant bilateral PE and family history, anticoagulation for at minimal six months versus indefinitely would be favored.   We repeated his hypercoagulable testing today which is pending. He was instructed to continue taking Xalreto 20 mg daily. He was provided a handout listing xalreto's side effect. He denies any bleeding. We provided  a handout on Factor V Leiden and had an extensive discussion including that it is the most common genetic risk factor for thrombosis and accounts for 94% of individuals classified as Activated Protein C (APC) resistant. Both heterozygotes and homozygotes are at risk for thrombosis, which is 5-10 fold, and 50-100 fold respectively. DNA testing for the V R2 polymorphism is recommended for Factor V Leiden heterozygotes. Presence of this polymorphism further increasesthe risk of venous thrombosis in Factor V Leiden heterozygotes. He was negative for Factor V mutation on prior workup and today's labs are pending. .   2. Dependent high-density in the right lower lobe, per CT of chest (03/30/2013)  --Differential included chronic aspiration but radiographic follow up was recommended. We will repeat a CT of  chest without contrast as a follow up prior to his next 3 month appointment.   3. Follow up.  --Patient instructed to have repeat CT of chest without contrast one week prior to his 3 month follow up visit.   All questions were answered. The patient knows to call the clinic with any problems, questions or concerns. We can certainly see the patient much sooner if necessary.  I spent 15 minutes counseling the patient face to face. The total time spent in the appointment was 25 minutes.    Tiaunna Buford, MD 07/17/2013 1:21 PM

## 2013-07-19 LAB — HYPERCOAGULABLE PANEL, COMPREHENSIVE
ANTICARDIOLIPIN IGG: 1 GPL U/mL (ref <23)
AntiThromb III Func: 104 % (ref 76–126)
Anticardiolipin IgA: 2 APL U/mL (ref <22)
Anticardiolipin IgM: 19 MPL U/mL — ABNORMAL HIGH (ref <11)
BETA 2 GLYCO I IGG: 0 G Units (ref <20)
Beta-2-Glycoprotein I IgA: 35 A Units — ABNORMAL HIGH (ref <20)
Beta-2-Glycoprotein I IgM: 55 M Units — ABNORMAL HIGH (ref <20)
DRVVT 1:1 Mix: 41.3 secs (ref <42.9)
DRVVT: 51.1 secs — ABNORMAL HIGH (ref <42.9)
LUPUS ANTICOAGULANT: NOT DETECTED
PROTEIN S ACTIVITY: 128 % (ref 69–129)
PROTEIN S TOTAL: 104 % (ref 60–150)
PTT Lupus Anticoagulant: 36.7 secs (ref 28.0–43.0)
Protein C Activity: 142 % — ABNORMAL HIGH (ref 75–133)
Protein C, Total: 77 % (ref 72–160)

## 2013-08-21 ENCOUNTER — Other Ambulatory Visit (HOSPITAL_COMMUNITY): Payer: Self-pay | Admitting: Internal Medicine

## 2013-09-15 ENCOUNTER — Other Ambulatory Visit: Payer: Self-pay | Admitting: Family Medicine

## 2013-09-15 ENCOUNTER — Ambulatory Visit
Admission: RE | Admit: 2013-09-15 | Discharge: 2013-09-15 | Disposition: A | Discharge status: Home / Self Care | Payer: BC Managed Care – PPO | Source: Ambulatory Visit | Attending: Family Medicine | Admitting: Family Medicine

## 2013-09-15 DIAGNOSIS — J209 Acute bronchitis, unspecified: Secondary | ICD-10-CM

## 2013-10-03 ENCOUNTER — Ambulatory Visit (HOSPITAL_COMMUNITY)
Admission: RE | Admit: 2013-10-03 | Discharge: 2013-10-03 | Disposition: A | Discharge status: Home / Self Care | Payer: BC Managed Care – PPO | Source: Ambulatory Visit | Attending: Internal Medicine | Admitting: Internal Medicine

## 2013-10-03 DIAGNOSIS — J40 Bronchitis, not specified as acute or chronic: Secondary | ICD-10-CM | POA: Insufficient documentation

## 2013-10-03 DIAGNOSIS — J984 Other disorders of lung: Secondary | ICD-10-CM | POA: Diagnosis present

## 2013-10-03 DIAGNOSIS — I2510 Atherosclerotic heart disease of native coronary artery without angina pectoris: Secondary | ICD-10-CM | POA: Insufficient documentation

## 2013-10-04 ENCOUNTER — Telehealth: Payer: Self-pay

## 2013-10-04 NOTE — Telephone Encounter (Signed)
Pt called requesting Ct results from 9/8 and lab results from 6/19. Pt scheduled to see MD Friday 9/11 at 230. POF to scheduler.

## 2013-10-05 ENCOUNTER — Telehealth: Payer: Self-pay | Admitting: Hematology

## 2013-10-05 NOTE — Telephone Encounter (Signed)
added 9.11 appt....per pof pt is aware

## 2013-10-06 ENCOUNTER — Ambulatory Visit (HOSPITAL_BASED_OUTPATIENT_CLINIC_OR_DEPARTMENT_OTHER): Payer: BC Managed Care – PPO | Admitting: Hematology

## 2013-10-06 ENCOUNTER — Telehealth: Payer: Self-pay | Admitting: Hematology

## 2013-10-06 DIAGNOSIS — I82409 Acute embolism and thrombosis of unspecified deep veins of unspecified lower extremity: Secondary | ICD-10-CM

## 2013-10-06 DIAGNOSIS — I2699 Other pulmonary embolism without acute cor pulmonale: Secondary | ICD-10-CM

## 2013-10-06 DIAGNOSIS — D6861 Antiphospholipid syndrome: Secondary | ICD-10-CM

## 2013-10-06 DIAGNOSIS — Z832 Family history of diseases of the blood and blood-forming organs and certain disorders involving the immune mechanism: Secondary | ICD-10-CM

## 2013-10-06 DIAGNOSIS — D6859 Other primary thrombophilia: Secondary | ICD-10-CM

## 2013-10-06 NOTE — Telephone Encounter (Signed)
Pt confirmed labs/ov per 09/11 POF, cancelled labs/ov 12/17 w/Dr. [name] per Dr. Lona Kettle, gave pt AVS....KJ

## 2013-10-07 ENCOUNTER — Encounter: Payer: Self-pay | Admitting: Hematology

## 2013-10-07 DIAGNOSIS — D6861 Antiphospholipid syndrome: Secondary | ICD-10-CM | POA: Insufficient documentation

## 2013-10-07 NOTE — Progress Notes (Signed)
[name] PROGRESS NOTE 10/06/2013  Spencer Chessman, MD [name] [address]  DIAGNOSIS: Pulmonary Embolism (bilateral) and Antiphospholipid Syndrome.  Chief Complaint  Patient presents with  . Follow-up    CURRENT TREATMENT: Xalreto 20 mg daily.   INTERVAL HISTORY:  Spencer Ross 63 y.o. male from [facility] who was admitted to [facility] on 03/30/2013 and discharged of 04/01/2013 following treatment for bilateral PEs. He was initially referred by Dr. [name] of Primary Care for further evaluation and seen initially on 05/18/2013 by Dr [name] and then had a follow up visit on 07/17/2013.   On 03/04,2015, he initially saw his PCP's office for chronic cough (described as intermittent) occurring over the prior 4 months and eventually had worsening right-sided back pain, posterior side radiating to the anterior, lower right chest that was pleuritic. He had a CXR on 03/05 as an outpatient which was abnormal and had questionable spot concerning for malignancy or pneumonia. He was sent for an antibiotic for pnuemionia. He was subsequently called and CT of chest as noted below which demonstrated bilateral PEs (right more than left). He was instructed to report for admission.   He reports that his symptoms started a couple days after he traveled to [place] (about 4 [address]) the week after christmas (01/23/2013). He had been having this persistent cough, no hemoptysis; Having chills occasionally, and sometimes fever for past 3 months. No leg edema, swelling or pain. No major injuries or trauma. No recent surgeries. Pt had a screening colonoscopy done last year 2014 that he reported was normal. He had 2 brothers and 1 nephew with h/o blood clots and one nephew with Factor V leiden defiency. He also has a maternal grandfather who died of a massive blood clot in his 4s. Pt has 3 children, 2 sons one daughter none of had blood  clots yet. His youngest son was tested and found positive as well for Factor V Leiden. His other son was negative. His daughter has not been tested. Pt has no h/o VTE, CVA, TIA or CAD. He did have knee surgery several years ago with no postoperative complications. He was very active, exercises up to 3 x a week on eliptical machine. In the hospital, he was started on heparin and was subsequently transioned to lovenox. He was discharged Xarelto and reports compliance to it daily.  He denies hemoptysis. He denies hematuria, melena. He works as a Nature conservation officer. He reports a hernia that has worsened. He also reports pain in his hips bilaterally. He injured the left one about one year ago. He also has knee athralgias.   He had thrombophilia workup done in March 2015 and repeated again in June 2015 (07/04/2013). Pertinent information from that shows that patient is negative for factor V leiden mutation, prothrombin gene mutation, normal levels of protein C, S, AT-III. Homocysteine level was normal. Lupus Anticoagulant negative.   Beta-2 Glycoprotein IgG 2 ws normal but Beta-2 Glycoprotein IgA 37 (Normal <20) and Beta-2 Glycoprotein IgM 51(Normal <20) was found elevated in March 2015. Repeat testing of these antibodies in June (07/14/13) showed that Beta-2 Glycoprotein IgM was 55 and IgA 35 were still elevated, IgG was 1 negative.  Similarly the Anticardiolipin antibody IgM was initially found to be elevated at 20 IN March 2015 and repeat testing in June 2015 showed it was 19 (Normal <11). The other 2 anticardiolipin antibodies IgA and IgG were normal on initial and repeat testing.  MEDICAL HISTORY: Past Medical History  Diagnosis Date  . Pulmonary embolism     INTERIM HISTORY: has Pulmonary emboli; Pleuritic chest pain; Cough, persistent; and FHx: factor V Leiden mutation on his problem list.  Patient continues to have some right sided pleurisy as a result of pulmonary infarction and scarring. He did  take a course of azithromycin and medrol dose pak few weeks ago with persistent pleurisy type symptoms and was told that he may get referred to a pulmonologist. He did have another CT scan Chest done without contrast on 10/04/2013 which showed that IMPRESSION: 1. Probable post infectious or postinflammatory scarring in the right lower lobe. 2. No discrete pulmonary nodule, as given in the provided history. 3. Left anterior descending coronary artery calcification.    ALLERGIES:  is allergic to penicillins.  MEDICATIONS: has a current medication list which includes the following prescription(s): b-complex with vitamin c, multivitamin with minerals, multivitamin-lutein, rivaroxaban, trazodone, and rivaroxaban.  SURGICAL HISTORY:  Past Surgical History  Procedure Laterality Date  . Knee surgery      right    REVIEW OF SYSTEMS:   Constitutional: Denies fevers, chills or abnormal weight loss Eyes: Denies blurriness of vision Ears, nose, mouth, throat, and face: Denies mucositis or sore throat Respiratory: Denies cough, dyspnea or wheezes Cardiovascular: Denies palpitation, chest discomfort or lower extremity swelling Gastrointestinal:  Denies nausea, heartburn or change in bowel habits Skin: Denies abnormal skin rashes Lymphatics: Denies new lymphadenopathy or easy bruising Neurological:Denies numbness, tingling or new weaknesses Behavioral/Psych: Mood is stable, no new changes  All other systems were reviewed with the patient and are negative.  PHYSICAL EXAMINATION: ECOG PERFORMANCE STATUS: 0 - Asymptomatic  There were no vitals taken for this visit.  GENERAL:alert, no distress and comfortable SKIN: skin color, texture, turgor are normal, no rashes or significant lesions EYES: normal, Conjunctiva are pink and non-injected, sclera clear OROPHARYNX:no exudate, no erythema and lips, buccal mucosa, and tongue normal  NECK: supple, thyroid normal size, non-tender, without nodularity LYMPH:   no palpable lymphadenopathy in the cervical, axillary or supraclavicular LUNGS: clear to auscultation with normal breathing effort, no wheezes or rhonchi HEART: regular rate & rhythm and no murmurs and no lower extremity edema ABDOMEN:abdomen soft, non-tender and normal bowel sounds Musculoskeletal:no cyanosis of digits and no clubbing  NEURO: alert & oriented x 3 with fluent speech, no focal motor/sensory deficits  Labs:  Lab Results  Component Value Date   WBC 3.7* 07/14/2013   HGB 14.0 07/14/2013   HCT 42.2 07/14/2013   MCV 89.0 07/14/2013   PLT 171 07/14/2013   NEUTROABS 1.9 07/14/2013      Chemistry      Component Value Date/Time   NA 143 07/14/2013 1038   NA 139 04/01/2013 0404   K 4.1 07/14/2013 1038   K 4.1 04/01/2013 0404   CL 102 04/01/2013 0404   CO2 27 07/14/2013 1038   CO2 25 04/01/2013 0404   BUN 13.6 07/14/2013 1038   BUN 14 04/01/2013 0404   CREATININE 0.9 07/14/2013 1038   CREATININE 0.84 04/01/2013 0404      Component Value Date/Time   CALCIUM 9.4 07/14/2013 1038   CALCIUM 9.3 04/01/2013 0404   ALKPHOS 54 05/18/2013 1325   AST 16 05/18/2013 1325   ALT 15 05/18/2013 1325   BILITOT 0.64 05/18/2013 1325        RADIOGRAPHIC STUDIES:  CT results discussed above from 10/04/2013  ASSESSMENT:  1. Pedro Earls 64 y.o. male with a history  of bilateral pulmonary emboli (spontaneous) in March 2015 and strong family history of factor V Leiden mutation but personal testing showed that he has antiphospholipid syndrome APS being manifested by an elevation of anticardiolipin IgM antibody and Beta-2 Glycoprotein IgA and IgM antibodies now proven again on repeat testing.  2. Pathologic levels of beta 2 GP1 antibodies occur in patients with anti-phospholipid syndrome (APS). APS is associated with a variety of clinical symptoms notably thrombosis, pregnancy complications, unexplained cutaneous circulatory disturbances (livido reticularis or pyoderma gangrenosum), thrombocytopenia or hemolytic  anemia, and nonbacterial thrombotic endocarditis. Beta 2 GP1 antibodies are found with increased frequency in patients with systemic rheumatic diseases, especially systemic lupus erythematosus. Phospholipids are present in the membranes that form the surface of cells, including blood cells and endothelial cells that line blood vessels. In some people, the immune system develops antibodies to these phospholipids or to proteins that are attached to them. The presence of these antiphospholipid antibodies may increase the risk of developing blood clots in the veins or arteries and may also cause an increased risk of miscarriage or stillbirth among pregnant women. However, some people have these antibodies  and do not develop clots or have miscarriages.People who have these antibodies and develop blood clots or pregnancy-related complications are said to have a syndrome called the antiphospholipid syndrome (APS). APS is an autoimmune disorder, meaning that it occurs when the body's immune system mistakenly attacks healthy tissues and organs.APS is more common in women and in patients with other autoimmune or rheumatic diseases, particularly systemic lupus erythematosus (SLE). APS is referred to as "primary" when it occurs alone and as "secondary" when it occurs in association with another disorder.  3. The presence of antiphospholipid antibodies can increase the risk of developing a thrombosis (blood clot) in a vein or artery. Without treatment, patients often experience repeated clots. Blood flow to and function of important organs can be affected, depending upon where the clot is located. Many organs are susceptible to injury from blood clots. A clot that forms in or blocks an artery can impair blood flow to the brain, causing problems ranging from brief, reversible neurologic symptoms to a stroke that causes permanent brain damage. Impaired blood flow to the kidney can cause problems ranging from mild kidney  dysfunction to kidney failure. Clots in large veins can lead to pain and swelling of the affected limb. This is referred to as a deep venous thrombosis (DVT). Legs are most often affected, but the arms can occasionally be involved. In addition to the pain and swelling that such blood clots cause, there is also a risk that a large clot will break free and travel through the heart to the blood vessels of the lungs, where it can block blood flow. A clot that travels is called an embolus; an embolus in the lung is called a pulmonary embolism  4. In some patients, APS leads to a decrease in the number of cells known as platelets. This condition is known as thrombocytopenia. Platelets are essential to the normal blood clotting process. When the number of platelets is significantly lowered (to less than 30,000), there is a risk of bleeding, particularly from the nose and gums, into the skin (called petechiae), from the digestive tract, and from the uterus in women. In patients with APS, however, the reduced number of platelets may be associated with an increased risk of blood clots rather than bleeding problems.  PLAN:  1. Bilateral PE, unprovoked (03/30/2013). + APS + family history of clots  --  Patient denies any provoking factors for his PE. We reviewed extensively his hypercoagulable work-up which shows presence of these circulatory antibodies which are seen as part of APS (ANTIPHOSPHOLIPID SYNDROME). He does have a strong family history of Factor V Leiden and blood clots. We reviewed his imaging as noted above. We discussed that his given his significant bilateral PE, presence of APS and family history, anticoagulation indefinitely would be recommended. He was instructed to continue taking Xalreto 20 mg daily.  I also told him to take a daily aspirin 81 mg to prevent any arterial events like TIA, CVA, MI which can be seen as part of APS. He was negative for Factor V mutation on prior workup. His wife can be  tested for that as one of their son have the FVL mutation and if positive both wife and son can benefit from the baby aspirin thrombo-prophylaxis and will need extra precautions with traveling and and elective surgeries.  2. Dependent high-density in the right lower lobe, per CT of chest (03/30/2013) followed again on CT dated 10/04/2013 --The new CT chest just shows some scarring from his pulmonary infarction and he continues to have some pleurisy in that region. Aspirin may help those symptoms.  3. Follow up.  -- I will see him again in 6 months and check a CBC, CMP, Anticardiolipin antibodies and beta 2 glycoprotein antibodies, a D-dimer and ANA screening test for Lupus.   All questions were answered. The patient knows to call the clinic with any problems, questions or concerns. We can certainly see the patient much sooner if necessary.  I spent 30 minutes counseling the patient face to face. The total time spent in the appointment was 35 minutes.    Bernadene Bell, MD Medical Hematologist/Oncologist [facility] Pager: [phone] Office No: [phone]

## 2013-11-10 ENCOUNTER — Encounter: Payer: Self-pay | Admitting: Pulmonary Disease

## 2013-11-10 ENCOUNTER — Ambulatory Visit (INDEPENDENT_AMBULATORY_CARE_PROVIDER_SITE_OTHER): Payer: BC Managed Care – PPO | Admitting: Pulmonary Disease

## 2013-11-10 ENCOUNTER — Encounter (INDEPENDENT_AMBULATORY_CARE_PROVIDER_SITE_OTHER): Payer: Self-pay

## 2013-11-10 VITALS — BP 116/64 | HR 65 | Ht 72.0 in | Wt 206.0 lb

## 2013-11-10 DIAGNOSIS — R053 Chronic cough: Secondary | ICD-10-CM

## 2013-11-10 DIAGNOSIS — R0602 Shortness of breath: Secondary | ICD-10-CM

## 2013-11-10 DIAGNOSIS — J309 Allergic rhinitis, unspecified: Secondary | ICD-10-CM | POA: Insufficient documentation

## 2013-11-10 DIAGNOSIS — J3089 Other allergic rhinitis: Secondary | ICD-10-CM

## 2013-11-10 DIAGNOSIS — K219 Gastro-esophageal reflux disease without esophagitis: Secondary | ICD-10-CM

## 2013-11-10 DIAGNOSIS — D6861 Antiphospholipid syndrome: Secondary | ICD-10-CM

## 2013-11-10 DIAGNOSIS — I2699 Other pulmonary embolism without acute cor pulmonale: Secondary | ICD-10-CM

## 2013-11-10 DIAGNOSIS — R05 Cough: Secondary | ICD-10-CM

## 2013-11-10 NOTE — Progress Notes (Signed)
Subjective:    Patient ID: Spencer Ross, male    DOB: 28-Aug-1950, 63 y.o.   MRN: [id number]  HPI Chief Complaint  Patient presents with  . Advice Only    Self referral for Bronchitis X2.5 mos.  Had 2 pulmonary embolisms in March, CT's had shown scarring in lungs. Was taking allergy injections X5 years, has stopped past couple months.     [name] says that he wanted to see [place] today for ongoing bronchitis symptoms.  He says he has been sick for about 2.5 months and he wanted to get it checked out.  Specifically in the last 2-3 weeks he feels fatigued and "wore out" and that his lungs are full of phlegm.  He has only noticed dyspnea when he walked up about 3 flights of stairs when he was at the beach a while back. He also had some pain in his left chest which resolved about 3-4 weeks ago and now resolved.  He wheezes in the morning which feels like it is coming from his throat.  He has not had a lot of sinus congestion but this does tend occur at night when he lies flat. He had some more sinus congestion about 2-3 weeks.  He has been on allergy injections for years and he has not been on them lately due to this illness.  He was taking the medicine for sinus congestion and runny nose type symptoms.  He had suffered with cough and congestion for sometime, but he wsa never told that he had asthma.  He had a pulmonary embolism in March which apparently was farily large and he has had some back and R chest wall discomfort on rare occasion since then. He is worried that perhaps this is related to his ongoing symptoms.  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  He does not have a basement or have any mold mildew or dust in the house. No pet.    Childhood was normal without respiratory illnesses.  Brother with asthma, but no history of asthma.   He had a blood clot back in 03/2013.  He had some back and  chest pain which persisted for weeks so he went to see his primary care physician.  He underwent a chest x-ray and a CT scan and ws found to have a pulmonary embolism.  There is a history of blood clots in the family.  (maternal father and paternal mother died of blood clots, two brothers with blood clots, nephews have clot)  Past Medical History  Diagnosis Date  . Pulmonary embolism   . Hypercholesteremia      Family History  Problem Relation Age of Onset  . Heart disease Father   . Asthma Paternal Grandfather      History   Social History  . Marital Status: Married    Spouse Name: N/A    Number of Children: N/A  . Years of Education: N/A   Occupational History  . Not on file.   Social History Main Topics  . Smoking status: Never Smoker   . Smokeless tobacco: Never Used  . Alcohol Use: No  . Drug Use: No  . Sexual Activity: Not on file   Other Topics Concern  . Not on file   Social History Narrative  . No narrative on file     Allergies  Allergen Reactions  . Penicillins Other (See  Comments)    "childhood reaction from mother"     Outpatient Prescriptions Prior to Visit  Medication Sig Dispense Refill  . B Complex-C (B-COMPLEX WITH VITAMIN C) tablet Take 1 tablet by mouth daily.      . Multiple Vitamin (MULTIVITAMIN WITH MINERALS) TABS tablet Take 1 tablet by mouth daily.      . multivitamin-lutein (OCUVITE-LUTEIN) CAPS capsule Take 1 capsule by mouth daily.      . Rivaroxaban (XARELTO) 20 MG TABS tablet Take 1 tablet (20 mg total) by mouth daily with supper. Start taking March 29th, 2015  30 tablet  3  . traZODone (DESYREL) 50 MG tablet Take 50 mg by mouth at bedtime as needed for sleep. Pt takes 1/2 pill      . Rivaroxaban (XARELTO) 15 MG TABS tablet Take 1 tablet (15 mg total) by mouth 2 (two) times daily with a meal. Take 15 mg tablet twice daily until April 22, 2013. After that continue taking 20 mg tablet once daily  42 tablet  0   No facility-administered  medications prior to visit.      Review of Systems  Constitutional: Negative for fever and unexpected weight change.  HENT: Positive for congestion. Negative for dental problem, ear pain, nosebleeds, postnasal drip, rhinorrhea, sinus pressure, sneezing, sore throat and trouble swallowing.   Eyes: Negative for redness and itching.  Respiratory: Positive for wheezing. Negative for cough, chest tightness and shortness of breath.   Cardiovascular: Negative for palpitations and leg swelling.  Gastrointestinal: Negative for nausea and vomiting.  Genitourinary: Negative for dysuria.  Musculoskeletal: Negative for joint swelling.  Skin: Negative for rash.  Neurological: Negative for headaches.  Hematological: Does not bruise/bleed easily.  Psychiatric/Behavioral: Negative for dysphoric mood. The patient is not nervous/anxious.        Objective:   Physical Exam Filed Vitals:   11/10/13 1332  BP: 116/64  Pulse: 65  Height: 6' (1.829 m)  Weight: 206 lb (93.441 kg)  SpO2: 96%  RA  Gen: well appearing, no acute distress HEENT: NCAT, PERRL, EOMi, OP clear, neck supple without masses PULM: CTA B CV: RRR, no mgr, no JVD AB: BS+, soft, nontender, no hsm Ext: warm, no edema, no clubbing, no cyanosis Derm: no rash or skin breakdown Neuro: A&Ox4, CN II-XII intact, strength 5/5 in all 4 extremities  September 2015 CT chest images reviewed> normal pulmonary parenchyma with the exception of very mild reticular changes in the base of the right lung, this is not indicative of a particular pulmonary fibrotic problem. 3 resolution of changes from his previous infarct.     Assessment & Plan:   Antiphospholipid syndrome I advised that he continue lifelong Xarelto given his history of antiphospholipid antibody syndrome.  Rarely, antiphospholipid antibody syndrome can cause a pulmonary parenchymal problem. However, I do not see clear evidence of that based on the most recent CT chest which is very  scant resolving changes in the right lower lobe. I feel that these changes are more likely to represent scarring from the recent pulmonary infarct.  Plan: -Will obtain pulmonary function testing now and again annually for the next one to 2 years to ensure that there is no evidence of parenchymal lung disease developing.  Pulmonary emboli Idiopathic. Would continue lifelong anticoagulation.  Cough, persistent He has an upper airway cough syndrome. It may have started with bronchitis but at this point he has laryngeal irritation which has been exacerbated by ongoing gastroesophageal reflux disease and postnasal drip. He is currently  not taking his immunotherapy I believe is allergic rhinitis is contributing to the postnasal drip. He also has laryngeal irritation from several weeks of coughing, this is causing cyclical cough.  Plan: -See allergic rhinitis and see GERD -Voice rest was encouraged -Use Tessalon Perles over-the-counter Delsym. -If no improvement then consider repeat imaging of the chest.  Allergic rhinitis He is currently not taking any therapy for this. He needs to restart his immunotherapy.  Plan: -Restart immunotherapy -Over-the-counter generic Zyrtec -Restart Flonase -Saline rinses  GERD (gastroesophageal reflux disease) This is contributing significantly to his cough. He currently does not follow and acid reflux modifying diet.  Plan: -Gastroesophageal reflux disease diet and lifestyle modification changes discussed, educational materials provided -Prilosec daily   Updated Medication List Outpatient Encounter Prescriptions as of 11/10/2013  Medication Sig  . Alfalfa 250 MG TABS Take 3 tablets by mouth daily.  Marland Kitchen aspirin 81 MG tablet Take 81 mg by mouth daily.  . B Complex-C (B-COMPLEX WITH VITAMIN C) tablet Take 1 tablet by mouth daily.  . clindamycin (CLEOCIN) 150 MG capsule Take 600 mg by mouth daily.  Marland Kitchen lactobacillus acidophilus (BACID) TABS tablet Take 1  tablet by mouth daily.  . Multiple Vitamin (MULTIVITAMIN WITH MINERALS) TABS tablet Take 1 tablet by mouth daily.  . multivitamin-lutein (OCUVITE-LUTEIN) CAPS capsule Take 1 capsule by mouth daily.  Marland Kitchen OVER THE COUNTER MEDICATION "clear lungs" takes one daily when remembers.  . Rivaroxaban (XARELTO) 20 MG TABS tablet Take 1 tablet (20 mg total) by mouth daily with supper. Start taking March 29th, 2015  . saw palmetto 160 MG capsule Take 160 mg by mouth daily.  . traZODone (DESYREL) 50 MG tablet Take 50 mg by mouth at bedtime as needed for sleep. Pt takes 1/2 pill  . [DISCONTINUED] Rivaroxaban (XARELTO) 15 MG TABS tablet Take 1 tablet (15 mg total) by mouth 2 (two) times daily with a meal. Take 15 mg tablet twice daily until April 22, 2013. After that continue taking 20 mg tablet once daily

## 2013-11-10 NOTE — Patient Instructions (Signed)
For the cough: You need to try to suppress your cough to allow your larynx (voice box) to heal.  For three days don't talk, laugh, sing, or clear your throat. Do everything you can to suppress the cough during this time. Use hard candies (sugarless Jolly Ranchers) or non-mint or non-menthol containing cough drops during this time to soothe your throat.  Use a cough suppressant (Delsym over the counter or Tessalon from the hospital) around the clock during this time.  After three days, gradually increase the use of your voice and back off on the cough suppressants.  Follow the GERD diet we gave you Take a prilosec every day  For the post nasal drip: Use Neil Med rinses with distilled water at least twice per day using the instructions on the package. 1/2 hour after using the [facility] Med rinse, use Nasacort two puffs in each nostril once per day.  Remember that the Nasacort can take 1-2 weeks to work after regular use. Use generic zyrtec (cetirizine) every day.  If this doesn't help, then stop taking it and use chlorpheniramine-phenylephrine combination tablets.  Follow all these instructions carefully for a month.  If no improvement then come back sooner  Otherwise we will set up lung function testing in the next month and will have you follow up with [place] in three months

## 2013-11-10 NOTE — Assessment & Plan Note (Signed)
Idiopathic. Would continue lifelong anticoagulation.

## 2013-11-10 NOTE — Assessment & Plan Note (Signed)
He has an upper airway cough syndrome. It may have started with bronchitis but at this point he has laryngeal irritation which has been exacerbated by ongoing gastroesophageal reflux disease and postnasal drip. He is currently not taking his immunotherapy I believe is allergic rhinitis is contributing to the postnasal drip. He also has laryngeal irritation from several weeks of coughing, this is causing cyclical cough.  Plan: -See allergic rhinitis and see GERD -Voice rest was encouraged -Use Tessalon Perles over-the-counter Delsym. -If no improvement then consider repeat imaging of the chest.

## 2013-11-10 NOTE — Assessment & Plan Note (Signed)
He is currently not taking any therapy for this. He needs to restart his immunotherapy.  Plan: -Restart immunotherapy -Over-the-counter generic Zyrtec -Restart Flonase -Saline rinses

## 2013-11-10 NOTE — Assessment & Plan Note (Signed)
I advised that he continue lifelong Xarelto given his history of antiphospholipid antibody syndrome.  Rarely, antiphospholipid antibody syndrome can cause a pulmonary parenchymal problem. However, I do not see clear evidence of that based on the most recent CT chest which is very scant resolving changes in the right lower lobe. I feel that these changes are more likely to represent scarring from the recent pulmonary infarct.  Plan: -Will obtain pulmonary function testing now and again annually for the next one to 2 years to ensure that there is no evidence of parenchymal lung disease developing.

## 2013-11-10 NOTE — Assessment & Plan Note (Signed)
This is contributing significantly to his cough. He currently does not follow and acid reflux modifying diet.  Plan: -Gastroesophageal reflux disease diet and lifestyle modification changes discussed, educational materials provided -Prilosec daily

## 2013-12-15 ENCOUNTER — Ambulatory Visit (INDEPENDENT_AMBULATORY_CARE_PROVIDER_SITE_OTHER): Payer: BC Managed Care – PPO | Admitting: Pulmonary Disease

## 2013-12-15 DIAGNOSIS — R0602 Shortness of breath: Secondary | ICD-10-CM

## 2013-12-15 LAB — PULMONARY FUNCTION TEST
DL/VA % pred: 80 %
DL/VA: 3.83 ml/min/mmHg/L
DLCO UNC % PRED: 77 %
DLCO unc: 27.27 ml/min/mmHg
FEF 25-75 Post: 3.64 L/sec
FEF 25-75 Pre: 3.3 L/sec
FEF2575-%CHANGE-POST: 10 %
FEF2575-%PRED-POST: 116 %
FEF2575-%PRED-PRE: 105 %
FEV1-%CHANGE-POST: 5 %
FEV1-%PRED-PRE: 105 %
FEV1-%Pred-Post: 110 %
FEV1-POST: 4.26 L
FEV1-Pre: 4.04 L
FEV1FVC-%CHANGE-POST: 3 %
FEV1FVC-%Pred-Pre: 102 %
FEV6-%CHANGE-POST: 2 %
FEV6-%Pred-Post: 109 %
FEV6-%Pred-Pre: 106 %
FEV6-Post: 5.31 L
FEV6-Pre: 5.16 L
FEV6FVC-%Change-Post: 1 %
FEV6FVC-%Pred-Post: 104 %
FEV6FVC-%Pred-Pre: 103 %
FVC-%Change-Post: 1 %
FVC-%Pred-Post: 105 %
FVC-%Pred-Pre: 103 %
FVC-Post: 5.35 L
FVC-Pre: 5.26 L
POST FEV1/FVC RATIO: 80 %
Post FEV6/FVC ratio: 99 %
Pre FEV1/FVC ratio: 77 %
Pre FEV6/FVC Ratio: 98 %
RV % pred: 91 %
RV: 2.16 L
TLC % pred: 99 %
TLC: 7.35 L

## 2013-12-15 NOTE — Progress Notes (Signed)
PFT done today. 

## 2013-12-18 ENCOUNTER — Telehealth: Payer: Self-pay | Admitting: Pulmonary Disease

## 2013-12-18 NOTE — Telephone Encounter (Signed)
Pt aware of normal pft results.  Nothing further needed.

## 2013-12-18 NOTE — Progress Notes (Signed)
Quick Note:  Pt aware of results. ______ 

## 2013-12-18 NOTE — Progress Notes (Signed)
Quick Note:  lmtcb X1 to relay results. ______ 

## 2014-01-09 ENCOUNTER — Encounter: Payer: Self-pay | Admitting: Pulmonary Disease

## 2014-01-09 ENCOUNTER — Ambulatory Visit (INDEPENDENT_AMBULATORY_CARE_PROVIDER_SITE_OTHER): Payer: BC Managed Care – PPO | Admitting: Pulmonary Disease

## 2014-01-09 ENCOUNTER — Encounter (INDEPENDENT_AMBULATORY_CARE_PROVIDER_SITE_OTHER): Payer: Self-pay

## 2014-01-09 VITALS — BP 120/68 | HR 61 | Temp 97.5°F

## 2014-01-09 DIAGNOSIS — D6861 Antiphospholipid syndrome: Secondary | ICD-10-CM

## 2014-01-09 DIAGNOSIS — R05 Cough: Secondary | ICD-10-CM

## 2014-01-09 DIAGNOSIS — R053 Chronic cough: Secondary | ICD-10-CM

## 2014-01-09 NOTE — Patient Instructions (Signed)
Keep using the nasacort and Netti pot rinses as you are doing We will see you back in 11/2013 with a lung function test or sooner if needed

## 2014-01-09 NOTE — Assessment & Plan Note (Signed)
This problem has resolved.  It was due to his allergic rhinitis.  Plan: Continue Netti Pot rinses and Nasacort

## 2014-01-09 NOTE — Progress Notes (Signed)
   Subjective:    Patient ID: Spencer Ross, male    DOB: 10-02-1950, 63 y.o.   MRN: [id number]  Synopsis: Spencer Ross had an idiopathic PE in 2015 and has anti-phospolipid antibody syndrome. He also has allergic rhinitis related cough.  HPI  Chief Complaint  Patient presents with  . Follow-up    review pft.  Pt states cough and sob have improved since last visit.     Spencer Ross has been doing well since the last visit.  He says that the cough has resolved and he has not experienced dyspnea.  He been using the Netti pot and nasacort which have been working well.  He has experienced less sinus congestion with this.  Past Medical History  Diagnosis Date  . Pulmonary embolism   . Hypercholesteremia      Review of Systems     Objective:   Physical Exam Filed Vitals:   01/09/14 1133  BP: 120/68  Pulse: 61  Temp: 97.5 F (36.4 C)  TempSrc: Oral  SpO2: 96%   RA  Gen: well appearing, no acute distress HEENT: NCAT, EOMi, OP clear, PULM: CTA B CV: RRR, no mgr, no JVD AB: BS+, soft, nontender,  Ext: warm, no edema, no clubbing, no cyanosis Derm: no rash or skin breakdown Neuro: A&Ox4, MAEW  11/2013 PFT> ratio 80% FEV1 4.26L (110% pred, 5% change), TLC 7.35L (99% pred), DLCO 27.27 (77% pred)      Assessment & Plan:   Cough, persistent This problem has resolved.  It was due to his allergic rhinitis.  Plan: Continue Netti Pot rinses and Nasacort  Antiphospholipid syndrome He needs lifelong anticoagulation for his idiopathic PE and antiphospholipid antibody syndrome.  Though it can occur rarely, he has no evidence of pulmonary parenchymal disease related to this.  Plan: -continue lifelong anticoagulation -continue PFT annual, next 11/2014   Updated Medication List Outpatient Encounter Prescriptions as of 01/09/2014  Medication Sig  . Alfalfa 250 MG TABS Take 3 tablets by mouth daily.  Marland Kitchen aspirin 81 MG tablet Take 81 mg by mouth daily.  Marland Kitchen lactobacillus  acidophilus (BACID) TABS tablet Take 1 tablet by mouth daily.  . magnesium citrate SOLN Take by mouth once. Pt dilutes and uses as a spray.  . Multiple Vitamin (MULTIVITAMIN WITH MINERALS) TABS tablet Take 1 tablet by mouth daily.  Marland Kitchen omeprazole (PRILOSEC) 20 MG capsule Take 20 mg by mouth daily.  Marland Kitchen OVER THE COUNTER MEDICATION "clear lungs" takes one daily when remembers.  Marland Kitchen OVER THE COUNTER MEDICATION Macular supplement 1 tablet qd.  . Rivaroxaban (XARELTO) 20 MG TABS tablet Take 1 tablet (20 mg total) by mouth daily with supper. Start taking March 29th, 2015  . saw palmetto 160 MG capsule Take 160 mg by mouth daily.  . traZODone (DESYREL) 50 MG tablet Take 50 mg by mouth at bedtime as needed for sleep. Pt takes 1/2 pill  . [DISCONTINUED] B Complex-C (B-COMPLEX WITH VITAMIN C) tablet Take 1 tablet by mouth daily.  . [DISCONTINUED] clindamycin (CLEOCIN) 150 MG capsule Take 600 mg by mouth daily.  . [DISCONTINUED] multivitamin-lutein (OCUVITE-LUTEIN) CAPS capsule Take 1 capsule by mouth daily.

## 2014-01-09 NOTE — Assessment & Plan Note (Signed)
He needs lifelong anticoagulation for his idiopathic PE and antiphospholipid antibody syndrome.  Though it can occur rarely, he has no evidence of pulmonary parenchymal disease related to this.  Plan: -continue lifelong anticoagulation -continue PFT annual, next 11/2014

## 2014-01-11 ENCOUNTER — Other Ambulatory Visit: Payer: BC Managed Care – PPO

## 2014-01-11 ENCOUNTER — Ambulatory Visit: Payer: BC Managed Care – PPO

## 2014-03-15 ENCOUNTER — Telehealth: Payer: Self-pay | Admitting: Hematology

## 2014-03-15 NOTE — Telephone Encounter (Signed)
, °

## 2014-04-16 ENCOUNTER — Other Ambulatory Visit: Payer: BC Managed Care – PPO

## 2014-04-25 ENCOUNTER — Ambulatory Visit: Payer: BC Managed Care – PPO

## 2014-05-04 ENCOUNTER — Ambulatory Visit: Payer: Self-pay | Admitting: Hematology

## 2016-06-03 ENCOUNTER — Other Ambulatory Visit: Payer: Self-pay | Admitting: Urology

## 2016-06-03 DIAGNOSIS — C61 Malignant neoplasm of prostate: Secondary | ICD-10-CM

## 2016-06-26 ENCOUNTER — Other Ambulatory Visit: Payer: Self-pay

## 2016-11-10 ENCOUNTER — Ambulatory Visit
Admission: RE | Admit: 2016-11-10 | Discharge: 2016-11-10 | Disposition: A | Discharge status: Home / Self Care | Payer: BLUE CROSS/BLUE SHIELD | Source: Ambulatory Visit | Attending: Urology | Admitting: Urology

## 2016-11-10 DIAGNOSIS — C61 Malignant neoplasm of prostate: Secondary | ICD-10-CM

## 2016-11-10 MED ORDER — GADOBENATE DIMEGLUMINE 529 MG/ML IV SOLN
15.0000 mL | Freq: Once | INTRAVENOUS | Status: AC | PRN
  Administered 2016-11-10: 15 mL via INTRAVENOUS

## 2017-12-31 DIAGNOSIS — C61 Malignant neoplasm of prostate: Secondary | ICD-10-CM | POA: Diagnosis not present

## 2018-01-03 DIAGNOSIS — J301 Allergic rhinitis due to pollen: Secondary | ICD-10-CM | POA: Diagnosis not present

## 2018-01-03 DIAGNOSIS — J3089 Other allergic rhinitis: Secondary | ICD-10-CM | POA: Diagnosis not present

## 2018-01-05 DIAGNOSIS — C61 Malignant neoplasm of prostate: Secondary | ICD-10-CM | POA: Diagnosis not present

## 2018-01-05 DIAGNOSIS — N401 Enlarged prostate with lower urinary tract symptoms: Secondary | ICD-10-CM | POA: Diagnosis not present

## 2018-01-05 DIAGNOSIS — R351 Nocturia: Secondary | ICD-10-CM | POA: Diagnosis not present

## 2018-01-21 DIAGNOSIS — J3089 Other allergic rhinitis: Secondary | ICD-10-CM | POA: Diagnosis not present

## 2018-01-21 DIAGNOSIS — J301 Allergic rhinitis due to pollen: Secondary | ICD-10-CM | POA: Diagnosis not present

## 2018-01-24 DIAGNOSIS — J301 Allergic rhinitis due to pollen: Secondary | ICD-10-CM | POA: Diagnosis not present

## 2018-01-24 DIAGNOSIS — J3089 Other allergic rhinitis: Secondary | ICD-10-CM | POA: Diagnosis not present

## 2018-01-27 DIAGNOSIS — J301 Allergic rhinitis due to pollen: Secondary | ICD-10-CM | POA: Diagnosis not present

## 2018-01-27 DIAGNOSIS — J3089 Other allergic rhinitis: Secondary | ICD-10-CM | POA: Diagnosis not present

## 2018-02-01 DIAGNOSIS — J301 Allergic rhinitis due to pollen: Secondary | ICD-10-CM | POA: Diagnosis not present

## 2018-02-01 DIAGNOSIS — J3089 Other allergic rhinitis: Secondary | ICD-10-CM | POA: Diagnosis not present

## 2018-02-03 DIAGNOSIS — J3089 Other allergic rhinitis: Secondary | ICD-10-CM | POA: Diagnosis not present

## 2018-02-03 DIAGNOSIS — J301 Allergic rhinitis due to pollen: Secondary | ICD-10-CM | POA: Diagnosis not present

## 2018-02-07 DIAGNOSIS — D485 Neoplasm of uncertain behavior of skin: Secondary | ICD-10-CM | POA: Diagnosis not present

## 2018-02-07 DIAGNOSIS — D2261 Melanocytic nevi of right upper limb, including shoulder: Secondary | ICD-10-CM | POA: Diagnosis not present

## 2018-02-07 DIAGNOSIS — J301 Allergic rhinitis due to pollen: Secondary | ICD-10-CM | POA: Diagnosis not present

## 2018-02-07 DIAGNOSIS — J3089 Other allergic rhinitis: Secondary | ICD-10-CM | POA: Diagnosis not present

## 2018-02-07 DIAGNOSIS — D220 Melanocytic nevi of lip: Secondary | ICD-10-CM | POA: Diagnosis not present

## 2018-02-17 DIAGNOSIS — H353112 Nonexudative age-related macular degeneration, right eye, intermediate dry stage: Secondary | ICD-10-CM | POA: Diagnosis not present

## 2018-02-17 DIAGNOSIS — H2513 Age-related nuclear cataract, bilateral: Secondary | ICD-10-CM | POA: Diagnosis not present

## 2018-02-17 DIAGNOSIS — H25013 Cortical age-related cataract, bilateral: Secondary | ICD-10-CM | POA: Diagnosis not present

## 2018-02-17 DIAGNOSIS — H40013 Open angle with borderline findings, low risk, bilateral: Secondary | ICD-10-CM | POA: Diagnosis not present

## 2018-02-21 ENCOUNTER — Telehealth (INDEPENDENT_AMBULATORY_CARE_PROVIDER_SITE_OTHER): Payer: Self-pay | Admitting: Orthopedic Surgery

## 2018-02-21 NOTE — Telephone Encounter (Signed)
Received vm from Cristie Hem w/ [facility] checking status of request. IC and advised records were faxed 1/20 and the xray CD mailed 1/20 and allow more time for the CD to get there

## 2018-02-22 DIAGNOSIS — J3089 Other allergic rhinitis: Secondary | ICD-10-CM | POA: Diagnosis not present

## 2018-02-22 DIAGNOSIS — J301 Allergic rhinitis due to pollen: Secondary | ICD-10-CM | POA: Diagnosis not present

## 2018-03-07 DIAGNOSIS — J3089 Other allergic rhinitis: Secondary | ICD-10-CM | POA: Diagnosis not present

## 2018-03-07 DIAGNOSIS — H1045 Other chronic allergic conjunctivitis: Secondary | ICD-10-CM | POA: Diagnosis not present

## 2018-03-07 DIAGNOSIS — R062 Wheezing: Secondary | ICD-10-CM | POA: Diagnosis not present

## 2018-03-07 DIAGNOSIS — J301 Allergic rhinitis due to pollen: Secondary | ICD-10-CM | POA: Diagnosis not present

## 2018-03-16 DIAGNOSIS — M25561 Pain in right knee: Secondary | ICD-10-CM | POA: Diagnosis not present

## 2018-03-16 DIAGNOSIS — Z01818 Encounter for other preprocedural examination: Secondary | ICD-10-CM | POA: Diagnosis not present

## 2018-03-16 DIAGNOSIS — Z86711 Personal history of pulmonary embolism: Secondary | ICD-10-CM | POA: Diagnosis not present

## 2018-03-29 DIAGNOSIS — J301 Allergic rhinitis due to pollen: Secondary | ICD-10-CM | POA: Diagnosis not present

## 2018-03-29 DIAGNOSIS — J3089 Other allergic rhinitis: Secondary | ICD-10-CM | POA: Diagnosis not present

## 2018-03-31 ENCOUNTER — Telehealth: Payer: Self-pay | Admitting: Hematology

## 2018-03-31 ENCOUNTER — Telehealth: Payer: Self-pay | Admitting: *Deleted

## 2018-03-31 NOTE — Telephone Encounter (Signed)
Patient called, stated he has new patient appt on 3/17 at 11am with Dr. Irene Limbo to clear him for a stem cell procedure on his knee (scheduled for 3/25). He stated he needs to have approval for surgery from Dr. Irene Limbo.  He also asked the following questions: 1)will MD order blood tests and can they be ordered before he sees MD? --- Informed him that Dr. Irene Limbo orders lab tests following the exam ; 2)will he have results the same day they are done? -- Informed him that depends on the tests ordered; 3)will the doctor need to give him any medications or change his? He's currently on Xarelto. -- Informed him that Dr. Irene Limbo will evaluate him prior to making any decisions related medication.  Spencer Ross verbalized understanding of all information and asked that if a sooner new patient appt with Dr. Irene Limbo becomes available, he would like to be scheduled earlier.

## 2018-03-31 NOTE — Telephone Encounter (Signed)
A new hem appt has been scheduled for the pt to see Dr. Irene Limbo on 3/17 at 11am. Pt aware to arrive 30 minutes early.

## 2018-04-11 NOTE — Progress Notes (Signed)
HEMATOLOGY/ONCOLOGY CONSULTATION NOTE  Date of Service: 04/12/2018  Patient Care Team: Lujean Amel, MD as PCP - General (Family Medicine) Lujean Amel, MD as Referring Physician (Family Medicine)  CHIEF COMPLAINTS/PURPOSE OF CONSULTATION:  History of Pulmonary Embolism  HISTORY OF PRESENTING ILLNESS:   Spencer Ross is a wonderful 68 y.o. male who has been referred to [place] by Dr. Lujean Amel for evaluation and management of History of Pulmonary Embolism. The pt reports that he is doing well overall.   The pt had an acute, subacute bilateral pulmonary embolism in 2015 and was recommended previously to pursue lifelong Xarelto.  He now intends to proceed with a stem cell procedure on his right knee on 04/20/18 in [place] with Dr. Carmin Richmond with [facility]. The pt notes that he will be having an injection into the joint which is "intended to grow back cartilage," a nerve block, and bone marrow draw. He endorses osteo-arthritis in his right knee.  The pt reports a strong family history with multiple family members having blood clots, and one nephew with FVL mutation.  The pt was taking 81mg  aspirin until two years ago, when he began to have nose bleeds at night. He has not had blood clots since beginning anticoagulation. The pt notes that he was off Xarelto the day before, day of, and day after a root canal a couple years ago.   The pt denies any other concerns at this time and notes that he is "pretty healthy." He sees Dr. Alinda Money in urology for low risk prostate cancer which has been monitored clinically and with biopsies for a "couple years."  Most recent lab results (03/17/18) of CBC is as follows: all values are WNL.  On review of systems, pt reports good energy levels, eating well, right knee pain, and denies concerns for infections, and any other symptoms.   On PMHx the pt reports PE in 2015 and osteo-arthritis in right knee. On Family Hx the pt reports son with FVL mutation,  two brother with blood clots, nephew with FVL and blood clots  MEDICAL HISTORY:  Past Medical History:  Diagnosis Date  . Hypercholesteremia   . Pulmonary embolism     SURGICAL HISTORY: Past Surgical History:  Procedure Laterality Date  . KNEE SURGERY     right    SOCIAL HISTORY: Social History   Socioeconomic History  . Marital status: Married    Spouse name: Not on file  . Number of children: Not on file  . Years of education: Not on file  . Highest education level: Not on file  Occupational History  . Not on file  Social Needs  . Financial resource strain: Not on file  . Food insecurity:    Worry: Not on file    Inability: Not on file  . Transportation needs:    Medical: Not on file    Non-medical: Not on file  Tobacco Use  . Smoking status: Never Smoker  . Smokeless tobacco: Never Used  Substance and Sexual Activity  . Alcohol use: No  . Drug use: No  . Sexual activity: Not on file  Lifestyle  . Physical activity:    Days per week: Not on file    Minutes per session: Not on file  . Stress: Not on file  Relationships  . Social connections:    Talks on phone: Not on file    Gets together: Not on file    Attends religious service: Not on file  Active member of club or organization: Not on file    Attends meetings of clubs or organizations: Not on file    Relationship status: Not on file  . Intimate partner violence:    Fear of current or ex partner: Not on file    Emotionally abused: Not on file    Physically abused: Not on file    Forced sexual activity: Not on file  Other Topics Concern  . Not on file  Social History Narrative  . Not on file    FAMILY HISTORY: Family History  Problem Relation Age of Onset  . Heart disease Father   . Asthma Paternal Grandfather     ALLERGIES:  is allergic to penicillins.  MEDICATIONS:  Current Outpatient Medications  Medication Sig Dispense Refill  . Alfalfa 250 MG TABS Take 3 tablets by mouth daily.     Marland Kitchen aspirin 81 MG tablet Take 81 mg by mouth daily.    Marland Kitchen lactobacillus acidophilus (BACID) TABS tablet Take 1 tablet by mouth daily.    . magnesium citrate SOLN Take by mouth once. Pt dilutes and uses as a spray.    . Multiple Vitamin (MULTIVITAMIN WITH MINERALS) TABS tablet Take 1 tablet by mouth daily.    Marland Kitchen omeprazole (PRILOSEC) 20 MG capsule Take 20 mg by mouth daily.    Marland Kitchen OVER THE COUNTER MEDICATION "clear lungs" takes one daily when remembers.    Marland Kitchen OVER THE COUNTER MEDICATION Macular supplement 1 tablet qd.    . Rivaroxaban (XARELTO) 20 MG TABS tablet Take 1 tablet (20 mg total) by mouth daily with supper. Start taking March 29th, 2015 30 tablet 3  . saw palmetto 160 MG capsule Take 160 mg by mouth daily.    . traZODone (DESYREL) 50 MG tablet Take 50 mg by mouth at bedtime as needed for sleep. Pt takes 1/2 pill     No current facility-administered medications for this visit.     REVIEW OF SYSTEMS:    10 Point review of Systems was done is negative except as noted above.  PHYSICAL EXAMINATION:  . Vitals:   04/12/18 1133  BP: 113/65  Pulse: 61  Resp: 18  Temp: 98.3 F (36.8 C)  SpO2: 98%   Filed Weights   04/12/18 1133  Weight: 202 lb 6.4 oz (91.8 kg)   .Body mass index is 27.45 kg/m.  GENERAL:alert, in no acute distress and comfortable SKIN: no acute rashes, no significant lesions EYES: conjunctiva are pink and non-injected, sclera anicteric OROPHARYNX: MMM, no exudates, no oropharyngeal erythema or ulceration NECK: supple, no JVD LYMPH:  no palpable lymphadenopathy in the cervical, axillary or inguinal regions LUNGS: clear to auscultation b/l with normal respiratory effort HEART: regular rate & rhythm ABDOMEN:  normoactive bowel sounds , non tender, not distended. Extremity: no pedal edema PSYCH: alert & oriented x 3 with fluent speech NEURO: no focal motor/sensory deficits  LABORATORY DATA:  I have reviewed the data as listed  . CBC Latest Ref Rng & Units  07/14/2013 05/18/2013 04/05/2013  WBC 4.0 - 10.3 10e3/uL 3.7(L) 5.5 5.5  Hemoglobin 13.0 - 17.1 g/dL 14.0 14.0 13.2  Hematocrit 38.4 - 49.9 % 42.2 43.3 38.9(L)  Platelets 140 - 400 10e3/uL 171 207 314    . CMP Latest Ref Rng & Units 07/14/2013 05/18/2013 04/01/2013  Glucose 70 - 140 mg/dl 76 90 103(H)  BUN 7.0 - 26.0 mg/dL 13.6 17.6 14  Creatinine 0.7 - 1.3 mg/dL 0.9 1.0 0.84  Sodium 136 - 145  mEq/L 143 143 139  Potassium 3.5 - 5.1 mEq/L 4.1 4.1 4.1  Chloride 96 - 112 mEq/L - - 102  CO2 22 - 29 mEq/L 27 26 25   Calcium 8.4 - 10.4 mg/dL 9.4 9.7 9.3  Total Protein 6.4 - 8.3 g/dL - 7.5 -  Total Bilirubin 0.20 - 1.20 mg/dL - 0.64 -  Alkaline Phos 40 - 150 U/L - 54 -  AST 5 - 34 U/L - 16 -  ALT 0 - 55 U/L - 15 -    03/17/18 CBC w/diff:        RADIOGRAPHIC STUDIES: I have personally reviewed the radiological images as listed and agreed with the findings in the report. No results found.  ASSESSMENT & PLAN:  68 y.o. male with  1. History of Pulmonary Embolism 2. Antiphospholipid ab syndrome PLAN -Discussed patient's most recent labs from 03/17/18, blood counts are normal -Reviewed the 03/30/13 CT Chest which revealed Acute or subacute bilateral pulmonary thromboembolic disease. 2. Pulmonary infarct in the right lower lobe. 3. Patchy airspace opacities at both lung bases are primarily linear and may reflect atelectasis or scarring. There is some dependent high-density in the right lower lobe which could be due to chronic aspiration. -Reviewed and discussed the 07/14/13 Hypercoagulable panel which was significant for persisting Beta-2 glycoprotein antibodies: IgM at 55, IgA at 35. Anticardiolipin IgM at 19. Negative for FVL and prothrombin gene mutation. -Pt has family history of multiple blood clots -Pt has been recommended to pursue lifelong anticoagulation with 20mg  Sheila Oats daily, as a result of the above findings and history -In general, want to minimize time off blood thinners around  patient's upcoming right knee procedure and discussed that the procedure inherently introduces an element of risk of blood clots -If pt can be on low dose blood thinners from surgical team's perspective, would recommend preventative dose of Xarelto of 10mg  starting 5 days prior to procedure, and resuming Xarelto 1 day after procedure -If surgical team prefers pt off all blood thinners, and in view of risks vs benefits,  pt could hold Xarelto 48 hours prior to procedure, and resume Xarelto 24 hours after procedure -If pt is to receive spinal anaesthesia, recommend holding Xarelto 3 days prior to procedure -Minimize time being off feet, recommend using compression socks, and feet elevation -I recommend that pt inquire if he is to receive any growth factors with trophic effects as part of his procedure, as this could have an impact on his low-grade prostate cancer's growth. -Will see the pt back as needed   RTC with Dr Irene Limbo as needed   All of the patients questions were answered with apparent satisfaction. The patient knows to call the clinic with any problems, questions or concerns.  The total time spent in the appt was 40 minutes and more than 50% was on counseling and direct patient cares.    Sullivan Lone MD MS AAHIVMS [facility] Hematology/Oncology Physician [facility]  (Office):       [phone] (Work cell):  [phone] (Fax):           [phone]  04/12/2018 12:11 PM  I, Baldwin Jamaica, am acting as a scribe for Dr. Sullivan Lone.   .I have reviewed the above documentation for accuracy and completeness, and I agree with the above. Brunetta Genera MD

## 2018-04-12 ENCOUNTER — Telehealth: Payer: Self-pay | Admitting: Hematology

## 2018-04-12 ENCOUNTER — Inpatient Hospital Stay: Payer: Medicare Other | Attending: Hematology | Admitting: Hematology

## 2018-04-12 ENCOUNTER — Other Ambulatory Visit: Payer: Self-pay

## 2018-04-12 VITALS — BP 113/65 | HR 61 | Temp 98.3°F | Resp 18 | Ht 72.0 in | Wt 202.4 lb

## 2018-04-12 DIAGNOSIS — Z79899 Other long term (current) drug therapy: Secondary | ICD-10-CM

## 2018-04-12 DIAGNOSIS — E7800 Pure hypercholesterolemia, unspecified: Secondary | ICD-10-CM | POA: Diagnosis not present

## 2018-04-12 DIAGNOSIS — C61 Malignant neoplasm of prostate: Secondary | ICD-10-CM | POA: Insufficient documentation

## 2018-04-12 DIAGNOSIS — M1711 Unilateral primary osteoarthritis, right knee: Secondary | ICD-10-CM | POA: Diagnosis not present

## 2018-04-12 DIAGNOSIS — D6861 Antiphospholipid syndrome: Secondary | ICD-10-CM | POA: Diagnosis not present

## 2018-04-12 DIAGNOSIS — Z86711 Personal history of pulmonary embolism: Secondary | ICD-10-CM | POA: Insufficient documentation

## 2018-04-12 DIAGNOSIS — I2699 Other pulmonary embolism without acute cor pulmonale: Secondary | ICD-10-CM

## 2018-04-12 DIAGNOSIS — Z7901 Long term (current) use of anticoagulants: Secondary | ICD-10-CM

## 2018-04-12 DIAGNOSIS — D6859 Other primary thrombophilia: Secondary | ICD-10-CM

## 2018-04-12 DIAGNOSIS — Z7982 Long term (current) use of aspirin: Secondary | ICD-10-CM

## 2018-04-12 NOTE — Telephone Encounter (Signed)
Per 3/17 los RTC with Dr Irene Limbo as needed.  Printed avs.

## 2018-04-19 ENCOUNTER — Telehealth: Payer: Self-pay | Admitting: *Deleted

## 2018-04-19 DIAGNOSIS — J3089 Other allergic rhinitis: Secondary | ICD-10-CM | POA: Diagnosis not present

## 2018-04-19 DIAGNOSIS — J301 Allergic rhinitis due to pollen: Secondary | ICD-10-CM | POA: Diagnosis not present

## 2018-04-19 NOTE — Telephone Encounter (Signed)
Faxed Anticoagulation Clearance Form to [facility]. [phone]. Patient is scheduled to have bio--restorative stem cell procedure on 04/20/2018. Fax confirmation received.

## 2018-05-10 DIAGNOSIS — J301 Allergic rhinitis due to pollen: Secondary | ICD-10-CM | POA: Diagnosis not present

## 2018-05-10 DIAGNOSIS — J3089 Other allergic rhinitis: Secondary | ICD-10-CM | POA: Diagnosis not present

## 2018-05-31 DIAGNOSIS — J3089 Other allergic rhinitis: Secondary | ICD-10-CM | POA: Diagnosis not present

## 2018-05-31 DIAGNOSIS — J301 Allergic rhinitis due to pollen: Secondary | ICD-10-CM | POA: Diagnosis not present

## 2018-06-21 DIAGNOSIS — J301 Allergic rhinitis due to pollen: Secondary | ICD-10-CM | POA: Diagnosis not present

## 2018-06-21 DIAGNOSIS — J3089 Other allergic rhinitis: Secondary | ICD-10-CM | POA: Diagnosis not present

## 2018-07-01 DIAGNOSIS — C61 Malignant neoplasm of prostate: Secondary | ICD-10-CM | POA: Diagnosis not present

## 2018-07-08 DIAGNOSIS — N401 Enlarged prostate with lower urinary tract symptoms: Secondary | ICD-10-CM | POA: Diagnosis not present

## 2018-07-08 DIAGNOSIS — C61 Malignant neoplasm of prostate: Secondary | ICD-10-CM | POA: Diagnosis not present

## 2018-07-08 DIAGNOSIS — R351 Nocturia: Secondary | ICD-10-CM | POA: Diagnosis not present

## 2018-07-12 DIAGNOSIS — J3089 Other allergic rhinitis: Secondary | ICD-10-CM | POA: Diagnosis not present

## 2018-07-12 DIAGNOSIS — J301 Allergic rhinitis due to pollen: Secondary | ICD-10-CM | POA: Diagnosis not present

## 2018-08-02 DIAGNOSIS — J3089 Other allergic rhinitis: Secondary | ICD-10-CM | POA: Diagnosis not present

## 2018-08-02 DIAGNOSIS — J301 Allergic rhinitis due to pollen: Secondary | ICD-10-CM | POA: Diagnosis not present

## 2018-08-23 DIAGNOSIS — J3089 Other allergic rhinitis: Secondary | ICD-10-CM | POA: Diagnosis not present

## 2018-08-23 DIAGNOSIS — J301 Allergic rhinitis due to pollen: Secondary | ICD-10-CM | POA: Diagnosis not present

## 2018-08-30 ENCOUNTER — Telehealth: Payer: Self-pay | Admitting: *Deleted

## 2018-08-30 NOTE — Telephone Encounter (Signed)
Received faxed request from Everlene Farrier, PA-C for Anticoagulation Clearance for bio-restorative orthopedic stem cell procedure scheduled 09/18/2018. Dr. Irene Limbo requested OV note from 04/12/2018 be sent to Ms. Bolling with direction to follow guidance under "Plan". Fax sent  [phone] - confirmation received.

## 2018-09-01 ENCOUNTER — Telehealth: Payer: Self-pay | Admitting: *Deleted

## 2018-09-01 NOTE — Telephone Encounter (Signed)
Contacted by Everlene Farrier, PA-C from [facility]. Patient scheduled in 2 weeks for stem cell procedure on right knee.  Dr. Berline Lopes (provider at  [facility]) would like Dr. Grier Mitts ok to change patient's Xarelto from original recommendation in OV note 04/12/2018:"If pt can be on low dose blood thinners from surgical team's perspective, would recommend preventative dose of Xarelto of 10mg  starting 5 days prior to procedure, and resuming Xarelto 1 day after procedure"  Dr. Berline Lopes would like patient to decrease Xarelto to 10 mg for only 2 days/48 hours before procedure and resume regualr Xarelto dose the day after procedure. Message given to Dr. Irene Limbo.

## 2018-09-13 DIAGNOSIS — J3089 Other allergic rhinitis: Secondary | ICD-10-CM | POA: Diagnosis not present

## 2018-09-13 DIAGNOSIS — J301 Allergic rhinitis due to pollen: Secondary | ICD-10-CM | POA: Diagnosis not present

## 2018-09-16 ENCOUNTER — Telehealth: Payer: Self-pay | Admitting: *Deleted

## 2018-09-16 MED ORDER — RIVAROXABAN 10 MG PO TABS: 10.0000 mg | ORAL_TABLET | Freq: Every day | ORAL | 0 refills | Status: DC

## 2018-09-16 NOTE — Telephone Encounter (Signed)
Per Dr.Kale:Ok for patient to decrease Xarelto to 10 mg for only 2 days/48 hours before procedure and resume regualr Xarelto dose the day after procedure. Dr.Kale authorized one time prescription to be sent to pharmacy for Xarelto 10 mg, #2 tablets. One time prescription for patient to take for 2 days prior to procedure. Prescription sent to pharmacy.  Hillis Range, [place] with [facility] [phone] with information as per Dr. Irene Limbo. Gave her the information as noted. She will contact patient with medication directions and notify him that prescription sent to pharmacy.

## 2018-09-19 ENCOUNTER — Other Ambulatory Visit: Payer: Self-pay | Admitting: *Deleted

## 2018-09-19 ENCOUNTER — Telehealth: Payer: Self-pay | Admitting: *Deleted

## 2018-09-19 MED ORDER — RIVAROXABAN 10 MG PO TABS: 10.0000 mg | ORAL_TABLET | Freq: Every day | ORAL | 0 refills | Status: DC

## 2018-09-19 NOTE — Telephone Encounter (Signed)
Previous note/order: per Dr. Irene Limbo -:[facility] for patient to decrease Xarelto to 10 mg for only 2 days/48 hours before procedure and resume regualr Xarelto dose the day after procedure. Dr.Kale authorized one time prescription to be sent to pharmacy for Xarelto 10 mg, #2 tablets. One time prescription for patient to take for 2 days prior to procedure. Prescription sent to pharmacy for 2 - 10 mg Xarelto tablets.  Clarification of above as of 8/24: Per Dr. Irene Limbo, patient needs to take 10 mg Xarelto tablet each day for 2 days prior to procedure AND on day of procedure. Will ned additional prescription for 1 10 mg tablet Xarelto.   Contacted pharmacy - prescription given verbally with rationale and then sent by e-scribe for Xarelto 10 mg tablet - take day of procedure. Pharmacist states patient had already picked up priscription for Xarelto 10 mg, #2 tablets - she will call him and advise that he will need to pick up an additional Xarelto 10 mg, 1 tablet for day of procedure.  Hillis Range, [place] with [facility] [phone] with information as per Dr. Irene Limbo regarding additional tablet. Gave her the information as noted. She will contact patient with medication directions and notify him that additional prescription for one additional 10 mg tablet sent to pharmacy.

## 2018-10-04 DIAGNOSIS — J3089 Other allergic rhinitis: Secondary | ICD-10-CM | POA: Diagnosis not present

## 2018-10-04 DIAGNOSIS — J301 Allergic rhinitis due to pollen: Secondary | ICD-10-CM | POA: Diagnosis not present

## 2018-10-05 DIAGNOSIS — J392 Other diseases of pharynx: Secondary | ICD-10-CM | POA: Diagnosis not present

## 2018-10-05 DIAGNOSIS — Z20828 Contact with and (suspected) exposure to other viral communicable diseases: Secondary | ICD-10-CM | POA: Diagnosis not present

## 2018-10-06 DIAGNOSIS — Z20828 Contact with and (suspected) exposure to other viral communicable diseases: Secondary | ICD-10-CM | POA: Diagnosis not present

## 2018-10-18 DIAGNOSIS — R351 Nocturia: Secondary | ICD-10-CM | POA: Diagnosis not present

## 2018-10-18 DIAGNOSIS — R350 Frequency of micturition: Secondary | ICD-10-CM | POA: Diagnosis not present

## 2018-10-31 DIAGNOSIS — J301 Allergic rhinitis due to pollen: Secondary | ICD-10-CM | POA: Diagnosis not present

## 2018-10-31 DIAGNOSIS — J3089 Other allergic rhinitis: Secondary | ICD-10-CM | POA: Diagnosis not present

## 2018-11-04 DIAGNOSIS — Z23 Encounter for immunization: Secondary | ICD-10-CM | POA: Diagnosis not present

## 2018-11-15 DIAGNOSIS — Z86711 Personal history of pulmonary embolism: Secondary | ICD-10-CM | POA: Diagnosis not present

## 2018-11-15 DIAGNOSIS — Z79899 Other long term (current) drug therapy: Secondary | ICD-10-CM | POA: Diagnosis not present

## 2018-11-15 DIAGNOSIS — E7800 Pure hypercholesterolemia, unspecified: Secondary | ICD-10-CM | POA: Diagnosis not present

## 2018-11-15 DIAGNOSIS — Z23 Encounter for immunization: Secondary | ICD-10-CM | POA: Diagnosis not present

## 2018-11-15 DIAGNOSIS — G4700 Insomnia, unspecified: Secondary | ICD-10-CM | POA: Diagnosis not present

## 2018-11-15 DIAGNOSIS — H9312 Tinnitus, left ear: Secondary | ICD-10-CM | POA: Diagnosis not present

## 2018-11-15 DIAGNOSIS — Z0000 Encounter for general adult medical examination without abnormal findings: Secondary | ICD-10-CM | POA: Diagnosis not present

## 2018-11-15 DIAGNOSIS — F411 Generalized anxiety disorder: Secondary | ICD-10-CM | POA: Diagnosis not present

## 2018-11-16 DIAGNOSIS — N401 Enlarged prostate with lower urinary tract symptoms: Secondary | ICD-10-CM | POA: Diagnosis not present

## 2018-11-16 DIAGNOSIS — R351 Nocturia: Secondary | ICD-10-CM | POA: Diagnosis not present

## 2018-11-16 DIAGNOSIS — C61 Malignant neoplasm of prostate: Secondary | ICD-10-CM | POA: Diagnosis not present

## 2018-11-21 DIAGNOSIS — J3089 Other allergic rhinitis: Secondary | ICD-10-CM | POA: Diagnosis not present

## 2018-11-21 DIAGNOSIS — J301 Allergic rhinitis due to pollen: Secondary | ICD-10-CM | POA: Diagnosis not present

## 2018-12-01 DIAGNOSIS — H9312 Tinnitus, left ear: Secondary | ICD-10-CM | POA: Insufficient documentation

## 2018-12-01 DIAGNOSIS — H903 Sensorineural hearing loss, bilateral: Secondary | ICD-10-CM | POA: Diagnosis not present

## 2018-12-01 DIAGNOSIS — H9313 Tinnitus, bilateral: Secondary | ICD-10-CM | POA: Diagnosis not present

## 2018-12-12 DIAGNOSIS — J301 Allergic rhinitis due to pollen: Secondary | ICD-10-CM | POA: Diagnosis not present

## 2018-12-12 DIAGNOSIS — J3089 Other allergic rhinitis: Secondary | ICD-10-CM | POA: Diagnosis not present

## 2019-02-13 ENCOUNTER — Telehealth (HOSPITAL_COMMUNITY): Payer: Self-pay

## 2019-02-13 ENCOUNTER — Other Ambulatory Visit: Payer: Self-pay

## 2019-02-13 DIAGNOSIS — M25561 Pain in right knee: Secondary | ICD-10-CM

## 2019-02-13 NOTE — Telephone Encounter (Signed)

## 2019-02-14 ENCOUNTER — Ambulatory Visit (INDEPENDENT_AMBULATORY_CARE_PROVIDER_SITE_OTHER): Payer: Medicare PPO | Admitting: Vascular Surgery

## 2019-02-14 ENCOUNTER — Encounter (HOSPITAL_COMMUNITY): Payer: Medicare Other

## 2019-02-14 ENCOUNTER — Encounter: Payer: Self-pay | Admitting: Vascular Surgery

## 2019-02-14 ENCOUNTER — Other Ambulatory Visit: Payer: Self-pay

## 2019-02-14 ENCOUNTER — Ambulatory Visit (HOSPITAL_COMMUNITY)
Admission: RE | Admit: 2019-02-14 | Discharge: 2019-02-14 | Disposition: A | Discharge status: Home / Self Care | Payer: Medicare PPO | Source: Ambulatory Visit | Attending: Vascular Surgery | Admitting: Vascular Surgery

## 2019-02-14 VITALS — BP 108/68 | HR 62 | Temp 97.8°F | Resp 20 | Ht 72.0 in | Wt 200.0 lb

## 2019-02-14 DIAGNOSIS — M25561 Pain in right knee: Secondary | ICD-10-CM | POA: Insufficient documentation

## 2019-02-14 NOTE — Progress Notes (Signed)
[facility] [place]  Patient name: Spencer Ross MRN: [id number] DOB: [birth year] Sex: male  REASON FOR CONSULT: Evaluation of right knee pain  HPI: Spencer Ross is a 69 y.o. male, who is here today for evaluation of right knee pain.  He had evaluation and treatment of right knee pain.  He was [facility] and underwent treatment with bone marrow derived stem cell into his right knee.  He reports that he has had persistent pain in his popliteal fossa and is seen today to rule out any type of vascular etiology for his pain.  There was some question of Baker's cyst but he reports that this was then felt to have resolved.  He has had no knee surgery.  He has no history of arterial occlusive disease and no history of coronary disease.  Does have a prior history of pulmonary embolus.  He is on Xarelto for anticoagulation  Past Medical History:  Diagnosis Date  . Hypercholesteremia   . Pulmonary embolism (HCC)     Family History  Problem Relation Age of Onset  . Heart disease Father   . Asthma Paternal Grandfather     SOCIAL HISTORY: Social History   Socioeconomic History  . Marital status: Married    Spouse name: Not on file  . Number of children: Not on file  . Years of education: Not on file  . Highest education level: Not on file  Occupational History  . Not on file  Tobacco Use  . Smoking status: Never Smoker  . Smokeless tobacco: Never Used  Substance and Sexual Activity  . Alcohol use: No  . Drug use: No  . Sexual activity: Not on file  Other Topics Concern  . Not on file  Social History Narrative  . Not on file   Social Determinants of Health   Financial Resource Strain:   . Difficulty of Paying Living Expenses: Not on file  Food Insecurity:   . Worried About Charity fundraiser in the Last Year: Not on file  . Ran Out of Food in the Last Year: Not on file   Transportation Needs:   . Lack of Transportation (Medical): Not on file  . Lack of Transportation (Non-Medical): Not on file  Physical Activity:   . Days of Exercise per Week: Not on file  . Minutes of Exercise per Session: Not on file  Stress:   . Feeling of Stress : Not on file  Social Connections:   . Frequency of Communication with Friends and Family: Not on file  . Frequency of Social Gatherings with Friends and Family: Not on file  . Attends Religious Services: Not on file  . Active Member of Clubs or Organizations: Not on file  . Attends Archivist Meetings: Not on file  . Marital Status: Not on file  Intimate Partner Violence:   . Fear of Current or Ex-Partner: Not on file  . Emotionally Abused: Not on file  . Physically Abused: Not on file  . Sexually Abused: Not on file    Allergies  Allergen Reactions  . Penicillins Other (See Comments)    "childhood reaction from mother"    Current Outpatient Medications  Medication Sig Dispense Refill  . Ascorbic Acid (VITAMIN C) 1000 MG tablet Take 1,000 mg by mouth daily.    . cholecalciferol (VITAMIN D3) 25 MCG (1000 UNIT) tablet Take 1,000 Units by mouth daily.    Marland Kitchen  finasteride (PROSCAR) 5 MG tablet TK 1 T PO QD    . magnesium citrate SOLN Take by mouth once. Pt dilutes and uses as a spray.    . Multiple Vitamin (MULTIVITAMIN WITH MINERALS) TABS tablet Take 1 tablet by mouth daily.    . Omega-3 Fatty Acids (FISH OIL) 1000 MG CAPS Take 1,000 mg by mouth 2 (two) times a week.    . Rivaroxaban (XARELTO) 20 MG TABS tablet Take 1 tablet (20 mg total) by mouth daily with supper. Start taking March 29th, 2015 30 tablet 3  . traZODone (DESYREL) 50 MG tablet Take 50 mg by mouth at bedtime as needed for sleep. Pt takes 1/2 pill    . vitamin E 200 UNIT capsule Take 200 Units by mouth daily.    Marland Kitchen zinc gluconate 50 MG tablet Take 50 mg by mouth daily.    . Alfalfa 250 MG TABS Take 3 tablets by mouth daily.    Marland Kitchen omeprazole  (PRILOSEC) 20 MG capsule Take 20 mg by mouth daily.    . saw palmetto 160 MG capsule Take 160 mg by mouth daily.     No current facility-administered medications for this visit.    REVIEW OF SYSTEMS:  [X]  denotes positive finding, [ ]  denotes negative finding Cardiac  Comments:  Chest pain or chest pressure:    Shortness of breath upon exertion:    Short of breath when lying flat:    Irregular heart rhythm:        Vascular    Pain in calf, thigh, or hip brought on by ambulation:    Pain in feet at night that wakes you up from your sleep:     Blood clot in your veins:    Leg swelling:         Pulmonary    Oxygen at home:    Productive cough:     Wheezing:         Neurologic    Sudden weakness in arms or legs:     Sudden numbness in arms or legs:     Sudden onset of difficulty speaking or slurred speech:    Temporary loss of vision in one eye:     Problems with dizziness:         Gastrointestinal    Blood in stool:     Vomited blood:         Genitourinary    Burning when urinating:     Blood in urine:        Psychiatric    Major depression:         Hematologic    Bleeding problems:    Problems with blood clotting too easily:        Skin    Rashes or ulcers:        Constitutional    Fever or chills:      PHYSICAL EXAM: Vitals:   02/14/19 1501  BP: 108/68  Pulse: 62  Resp: 20  Temp: 97.8 F (36.6 C)  SpO2: 98%  Weight: 200 lb (90.7 kg)  Height: 6' (1.829 m)    GENERAL: The patient is a well-nourished male, in no acute distress. The vital signs are documented above. CARDIOVASCULAR: Carotid arteries are without bruits bilaterally.  He has 2+ radial 2+ femoral 2+ popliteal 2+ posterior tibial pulses bilaterally.  I do not palpate popliteal artery aneurysms. PULMONARY: There is good air exchange  ABDOMEN: Soft and non-tender  MUSCULOSKELETAL: There are no major deformities or  cyanosis. NEUROLOGIC: No focal weakness or paresthesias are detected. SKIN:  There are no ulcers or rashes noted. PSYCHIATRIC: The patient has a normal affect.  DATA:  Noninvasive studies today reveal a maximal diameter of 1.1 cm in his popliteal artery with normal vein.  I do not see a Baker's cyst.  MEDICAL ISSUES: I discussed these findings with the patient.  I explained that he does not have any evidence of popliteal artery aneurysm.  His popliteal artery is upper limits of normal size.  Explained the typical popliteal aneurysms are dangerous because they are not symptomatic and therefore can become quite large before they present.  I do not see any arterial or venous explanation for his knee pain.  He was reassured with this and will see [place] again on an as-needed basis   Rosetta Posner, MD [facility] Tel [phone] Pager [phone]

## 2019-03-26 ENCOUNTER — Ambulatory Visit: Payer: Medicare PPO

## 2019-12-05 ENCOUNTER — Ambulatory Visit
Admission: RE | Admit: 2019-12-05 | Discharge: 2019-12-05 | Disposition: A | Discharge status: Home / Self Care | Payer: Medicare PPO | Source: Ambulatory Visit | Attending: Family Medicine | Admitting: Family Medicine

## 2019-12-05 ENCOUNTER — Other Ambulatory Visit: Payer: Self-pay | Admitting: Family Medicine

## 2019-12-05 DIAGNOSIS — C61 Malignant neoplasm of prostate: Secondary | ICD-10-CM | POA: Diagnosis not present

## 2019-12-05 DIAGNOSIS — E7800 Pure hypercholesterolemia, unspecified: Secondary | ICD-10-CM | POA: Diagnosis not present

## 2019-12-05 DIAGNOSIS — M533 Sacrococcygeal disorders, not elsewhere classified: Secondary | ICD-10-CM | POA: Diagnosis not present

## 2019-12-05 DIAGNOSIS — Z23 Encounter for immunization: Secondary | ICD-10-CM | POA: Diagnosis not present

## 2019-12-05 DIAGNOSIS — Z79899 Other long term (current) drug therapy: Secondary | ICD-10-CM | POA: Diagnosis not present

## 2019-12-05 DIAGNOSIS — M47816 Spondylosis without myelopathy or radiculopathy, lumbar region: Secondary | ICD-10-CM | POA: Diagnosis not present

## 2019-12-05 DIAGNOSIS — Z86711 Personal history of pulmonary embolism: Secondary | ICD-10-CM | POA: Diagnosis not present

## 2019-12-05 DIAGNOSIS — F411 Generalized anxiety disorder: Secondary | ICD-10-CM | POA: Diagnosis not present

## 2019-12-05 DIAGNOSIS — R1032 Left lower quadrant pain: Secondary | ICD-10-CM

## 2019-12-05 DIAGNOSIS — Z0001 Encounter for general adult medical examination with abnormal findings: Secondary | ICD-10-CM | POA: Diagnosis not present

## 2019-12-05 DIAGNOSIS — M25552 Pain in left hip: Secondary | ICD-10-CM | POA: Diagnosis not present

## 2019-12-05 DIAGNOSIS — G4700 Insomnia, unspecified: Secondary | ICD-10-CM | POA: Diagnosis not present

## 2019-12-05 DIAGNOSIS — N529 Male erectile dysfunction, unspecified: Secondary | ICD-10-CM | POA: Diagnosis not present

## 2019-12-08 DIAGNOSIS — J3089 Other allergic rhinitis: Secondary | ICD-10-CM | POA: Diagnosis not present

## 2019-12-08 DIAGNOSIS — J301 Allergic rhinitis due to pollen: Secondary | ICD-10-CM | POA: Diagnosis not present

## 2019-12-13 ENCOUNTER — Other Ambulatory Visit: Payer: Self-pay | Admitting: Family Medicine

## 2019-12-13 ENCOUNTER — Ambulatory Visit
Admission: RE | Admit: 2019-12-13 | Discharge: 2019-12-13 | Disposition: A | Discharge status: Home / Self Care | Payer: Medicare PPO | Source: Ambulatory Visit | Attending: Family Medicine | Admitting: Family Medicine

## 2019-12-13 DIAGNOSIS — M545 Low back pain, unspecified: Secondary | ICD-10-CM | POA: Diagnosis not present

## 2019-12-13 DIAGNOSIS — M47814 Spondylosis without myelopathy or radiculopathy, thoracic region: Secondary | ICD-10-CM | POA: Diagnosis not present

## 2019-12-25 DIAGNOSIS — M47814 Spondylosis without myelopathy or radiculopathy, thoracic region: Secondary | ICD-10-CM | POA: Diagnosis not present

## 2019-12-25 DIAGNOSIS — M9902 Segmental and somatic dysfunction of thoracic region: Secondary | ICD-10-CM | POA: Diagnosis not present

## 2019-12-25 DIAGNOSIS — M5136 Other intervertebral disc degeneration, lumbar region: Secondary | ICD-10-CM | POA: Diagnosis not present

## 2019-12-25 DIAGNOSIS — M9903 Segmental and somatic dysfunction of lumbar region: Secondary | ICD-10-CM | POA: Diagnosis not present

## 2019-12-25 DIAGNOSIS — M5137 Other intervertebral disc degeneration, lumbosacral region: Secondary | ICD-10-CM | POA: Diagnosis not present

## 2019-12-25 DIAGNOSIS — M9904 Segmental and somatic dysfunction of sacral region: Secondary | ICD-10-CM | POA: Diagnosis not present

## 2019-12-28 DIAGNOSIS — M9904 Segmental and somatic dysfunction of sacral region: Secondary | ICD-10-CM | POA: Diagnosis not present

## 2019-12-28 DIAGNOSIS — J301 Allergic rhinitis due to pollen: Secondary | ICD-10-CM | POA: Diagnosis not present

## 2019-12-28 DIAGNOSIS — M47814 Spondylosis without myelopathy or radiculopathy, thoracic region: Secondary | ICD-10-CM | POA: Diagnosis not present

## 2019-12-28 DIAGNOSIS — M9902 Segmental and somatic dysfunction of thoracic region: Secondary | ICD-10-CM | POA: Diagnosis not present

## 2019-12-28 DIAGNOSIS — M9903 Segmental and somatic dysfunction of lumbar region: Secondary | ICD-10-CM | POA: Diagnosis not present

## 2019-12-28 DIAGNOSIS — J3081 Allergic rhinitis due to animal (cat) (dog) hair and dander: Secondary | ICD-10-CM | POA: Diagnosis not present

## 2019-12-28 DIAGNOSIS — M5137 Other intervertebral disc degeneration, lumbosacral region: Secondary | ICD-10-CM | POA: Diagnosis not present

## 2019-12-28 DIAGNOSIS — J3089 Other allergic rhinitis: Secondary | ICD-10-CM | POA: Diagnosis not present

## 2019-12-28 DIAGNOSIS — M5136 Other intervertebral disc degeneration, lumbar region: Secondary | ICD-10-CM | POA: Diagnosis not present

## 2020-01-04 DIAGNOSIS — M5137 Other intervertebral disc degeneration, lumbosacral region: Secondary | ICD-10-CM | POA: Diagnosis not present

## 2020-01-04 DIAGNOSIS — M9904 Segmental and somatic dysfunction of sacral region: Secondary | ICD-10-CM | POA: Diagnosis not present

## 2020-01-04 DIAGNOSIS — M5136 Other intervertebral disc degeneration, lumbar region: Secondary | ICD-10-CM | POA: Diagnosis not present

## 2020-01-04 DIAGNOSIS — M9902 Segmental and somatic dysfunction of thoracic region: Secondary | ICD-10-CM | POA: Diagnosis not present

## 2020-01-04 DIAGNOSIS — M9903 Segmental and somatic dysfunction of lumbar region: Secondary | ICD-10-CM | POA: Diagnosis not present

## 2020-01-04 DIAGNOSIS — M47814 Spondylosis without myelopathy or radiculopathy, thoracic region: Secondary | ICD-10-CM | POA: Diagnosis not present

## 2020-01-11 DIAGNOSIS — K6289 Other specified diseases of anus and rectum: Secondary | ICD-10-CM | POA: Diagnosis not present

## 2020-01-11 DIAGNOSIS — K921 Melena: Secondary | ICD-10-CM | POA: Diagnosis not present

## 2020-01-15 DIAGNOSIS — C61 Malignant neoplasm of prostate: Secondary | ICD-10-CM | POA: Diagnosis not present

## 2020-01-18 DIAGNOSIS — J3089 Other allergic rhinitis: Secondary | ICD-10-CM | POA: Diagnosis not present

## 2020-01-18 DIAGNOSIS — J301 Allergic rhinitis due to pollen: Secondary | ICD-10-CM | POA: Diagnosis not present

## 2020-01-25 DIAGNOSIS — J3089 Other allergic rhinitis: Secondary | ICD-10-CM | POA: Diagnosis not present

## 2020-01-25 DIAGNOSIS — L111 Transient acantholytic dermatosis [Grover]: Secondary | ICD-10-CM | POA: Diagnosis not present

## 2020-01-25 DIAGNOSIS — L821 Other seborrheic keratosis: Secondary | ICD-10-CM | POA: Diagnosis not present

## 2020-01-25 DIAGNOSIS — D485 Neoplasm of uncertain behavior of skin: Secondary | ICD-10-CM | POA: Diagnosis not present

## 2020-01-25 DIAGNOSIS — J301 Allergic rhinitis due to pollen: Secondary | ICD-10-CM | POA: Diagnosis not present

## 2020-01-25 DIAGNOSIS — L820 Inflamed seborrheic keratosis: Secondary | ICD-10-CM | POA: Diagnosis not present

## 2020-01-25 DIAGNOSIS — D225 Melanocytic nevi of trunk: Secondary | ICD-10-CM | POA: Diagnosis not present

## 2020-01-25 DIAGNOSIS — L723 Sebaceous cyst: Secondary | ICD-10-CM | POA: Diagnosis not present

## 2020-01-25 DIAGNOSIS — D045 Carcinoma in situ of skin of trunk: Secondary | ICD-10-CM | POA: Diagnosis not present

## 2020-01-25 DIAGNOSIS — D0462 Carcinoma in situ of skin of left upper limb, including shoulder: Secondary | ICD-10-CM | POA: Diagnosis not present

## 2020-01-31 DIAGNOSIS — N401 Enlarged prostate with lower urinary tract symptoms: Secondary | ICD-10-CM | POA: Diagnosis not present

## 2020-01-31 DIAGNOSIS — R350 Frequency of micturition: Secondary | ICD-10-CM | POA: Diagnosis not present

## 2020-01-31 DIAGNOSIS — C61 Malignant neoplasm of prostate: Secondary | ICD-10-CM | POA: Diagnosis not present

## 2020-01-31 DIAGNOSIS — N5201 Erectile dysfunction due to arterial insufficiency: Secondary | ICD-10-CM | POA: Diagnosis not present

## 2020-02-01 DIAGNOSIS — M47814 Spondylosis without myelopathy or radiculopathy, thoracic region: Secondary | ICD-10-CM | POA: Diagnosis not present

## 2020-02-01 DIAGNOSIS — M5136 Other intervertebral disc degeneration, lumbar region: Secondary | ICD-10-CM | POA: Diagnosis not present

## 2020-02-01 DIAGNOSIS — M9903 Segmental and somatic dysfunction of lumbar region: Secondary | ICD-10-CM | POA: Diagnosis not present

## 2020-02-01 DIAGNOSIS — M5137 Other intervertebral disc degeneration, lumbosacral region: Secondary | ICD-10-CM | POA: Diagnosis not present

## 2020-02-01 DIAGNOSIS — M9904 Segmental and somatic dysfunction of sacral region: Secondary | ICD-10-CM | POA: Diagnosis not present

## 2020-02-01 DIAGNOSIS — M9902 Segmental and somatic dysfunction of thoracic region: Secondary | ICD-10-CM | POA: Diagnosis not present

## 2020-03-26 DIAGNOSIS — J3089 Other allergic rhinitis: Secondary | ICD-10-CM | POA: Diagnosis not present

## 2020-03-26 DIAGNOSIS — J301 Allergic rhinitis due to pollen: Secondary | ICD-10-CM | POA: Diagnosis not present

## 2020-03-28 DIAGNOSIS — J3089 Other allergic rhinitis: Secondary | ICD-10-CM | POA: Diagnosis not present

## 2020-03-28 DIAGNOSIS — J301 Allergic rhinitis due to pollen: Secondary | ICD-10-CM | POA: Diagnosis not present

## 2020-03-28 DIAGNOSIS — J3081 Allergic rhinitis due to animal (cat) (dog) hair and dander: Secondary | ICD-10-CM | POA: Diagnosis not present

## 2020-04-01 DIAGNOSIS — J3081 Allergic rhinitis due to animal (cat) (dog) hair and dander: Secondary | ICD-10-CM | POA: Diagnosis not present

## 2020-04-01 DIAGNOSIS — J3089 Other allergic rhinitis: Secondary | ICD-10-CM | POA: Diagnosis not present

## 2020-04-01 DIAGNOSIS — J301 Allergic rhinitis due to pollen: Secondary | ICD-10-CM | POA: Diagnosis not present

## 2020-04-15 DIAGNOSIS — J301 Allergic rhinitis due to pollen: Secondary | ICD-10-CM | POA: Diagnosis not present

## 2020-04-15 DIAGNOSIS — J3089 Other allergic rhinitis: Secondary | ICD-10-CM | POA: Diagnosis not present

## 2020-04-16 DIAGNOSIS — M9904 Segmental and somatic dysfunction of sacral region: Secondary | ICD-10-CM | POA: Diagnosis not present

## 2020-04-16 DIAGNOSIS — M5137 Other intervertebral disc degeneration, lumbosacral region: Secondary | ICD-10-CM | POA: Diagnosis not present

## 2020-04-16 DIAGNOSIS — M5136 Other intervertebral disc degeneration, lumbar region: Secondary | ICD-10-CM | POA: Diagnosis not present

## 2020-04-16 DIAGNOSIS — M47814 Spondylosis without myelopathy or radiculopathy, thoracic region: Secondary | ICD-10-CM | POA: Diagnosis not present

## 2020-04-16 DIAGNOSIS — M9903 Segmental and somatic dysfunction of lumbar region: Secondary | ICD-10-CM | POA: Diagnosis not present

## 2020-04-16 DIAGNOSIS — M9902 Segmental and somatic dysfunction of thoracic region: Secondary | ICD-10-CM | POA: Diagnosis not present

## 2020-04-19 DIAGNOSIS — H5310 Unspecified subjective visual disturbances: Secondary | ICD-10-CM | POA: Diagnosis not present

## 2020-04-30 DIAGNOSIS — J301 Allergic rhinitis due to pollen: Secondary | ICD-10-CM | POA: Diagnosis not present

## 2020-04-30 DIAGNOSIS — J3089 Other allergic rhinitis: Secondary | ICD-10-CM | POA: Diagnosis not present

## 2020-05-14 DIAGNOSIS — J3089 Other allergic rhinitis: Secondary | ICD-10-CM | POA: Diagnosis not present

## 2020-05-14 DIAGNOSIS — J301 Allergic rhinitis due to pollen: Secondary | ICD-10-CM | POA: Diagnosis not present

## 2020-05-28 DIAGNOSIS — M47814 Spondylosis without myelopathy or radiculopathy, thoracic region: Secondary | ICD-10-CM | POA: Diagnosis not present

## 2020-05-28 DIAGNOSIS — M5137 Other intervertebral disc degeneration, lumbosacral region: Secondary | ICD-10-CM | POA: Diagnosis not present

## 2020-05-28 DIAGNOSIS — M5136 Other intervertebral disc degeneration, lumbar region: Secondary | ICD-10-CM | POA: Diagnosis not present

## 2020-05-28 DIAGNOSIS — M9902 Segmental and somatic dysfunction of thoracic region: Secondary | ICD-10-CM | POA: Diagnosis not present

## 2020-05-28 DIAGNOSIS — M9904 Segmental and somatic dysfunction of sacral region: Secondary | ICD-10-CM | POA: Diagnosis not present

## 2020-05-28 DIAGNOSIS — M9903 Segmental and somatic dysfunction of lumbar region: Secondary | ICD-10-CM | POA: Diagnosis not present

## 2020-05-30 DIAGNOSIS — J3089 Other allergic rhinitis: Secondary | ICD-10-CM | POA: Diagnosis not present

## 2020-05-30 DIAGNOSIS — J301 Allergic rhinitis due to pollen: Secondary | ICD-10-CM | POA: Diagnosis not present

## 2020-06-12 DIAGNOSIS — J3089 Other allergic rhinitis: Secondary | ICD-10-CM | POA: Diagnosis not present

## 2020-06-12 DIAGNOSIS — J301 Allergic rhinitis due to pollen: Secondary | ICD-10-CM | POA: Diagnosis not present

## 2020-06-13 DIAGNOSIS — H524 Presbyopia: Secondary | ICD-10-CM | POA: Diagnosis not present

## 2020-06-13 DIAGNOSIS — H5310 Unspecified subjective visual disturbances: Secondary | ICD-10-CM | POA: Diagnosis not present

## 2020-06-13 DIAGNOSIS — H25013 Cortical age-related cataract, bilateral: Secondary | ICD-10-CM | POA: Diagnosis not present

## 2020-06-13 DIAGNOSIS — H35361 Drusen (degenerative) of macula, right eye: Secondary | ICD-10-CM | POA: Diagnosis not present

## 2020-07-02 DIAGNOSIS — J301 Allergic rhinitis due to pollen: Secondary | ICD-10-CM | POA: Diagnosis not present

## 2020-07-02 DIAGNOSIS — J3089 Other allergic rhinitis: Secondary | ICD-10-CM | POA: Diagnosis not present

## 2020-07-09 DIAGNOSIS — Z20822 Contact with and (suspected) exposure to covid-19: Secondary | ICD-10-CM | POA: Diagnosis not present

## 2020-07-24 DIAGNOSIS — J301 Allergic rhinitis due to pollen: Secondary | ICD-10-CM | POA: Diagnosis not present

## 2020-07-24 DIAGNOSIS — J3089 Other allergic rhinitis: Secondary | ICD-10-CM | POA: Diagnosis not present

## 2020-07-30 DIAGNOSIS — M9902 Segmental and somatic dysfunction of thoracic region: Secondary | ICD-10-CM | POA: Diagnosis not present

## 2020-07-30 DIAGNOSIS — M5136 Other intervertebral disc degeneration, lumbar region: Secondary | ICD-10-CM | POA: Diagnosis not present

## 2020-07-30 DIAGNOSIS — M5137 Other intervertebral disc degeneration, lumbosacral region: Secondary | ICD-10-CM | POA: Diagnosis not present

## 2020-07-30 DIAGNOSIS — M47814 Spondylosis without myelopathy or radiculopathy, thoracic region: Secondary | ICD-10-CM | POA: Diagnosis not present

## 2020-07-30 DIAGNOSIS — M9903 Segmental and somatic dysfunction of lumbar region: Secondary | ICD-10-CM | POA: Diagnosis not present

## 2020-07-30 DIAGNOSIS — M9904 Segmental and somatic dysfunction of sacral region: Secondary | ICD-10-CM | POA: Diagnosis not present

## 2020-08-07 DIAGNOSIS — J301 Allergic rhinitis due to pollen: Secondary | ICD-10-CM | POA: Diagnosis not present

## 2020-08-07 DIAGNOSIS — J3089 Other allergic rhinitis: Secondary | ICD-10-CM | POA: Diagnosis not present

## 2020-08-09 DIAGNOSIS — C61 Malignant neoplasm of prostate: Secondary | ICD-10-CM | POA: Diagnosis not present

## 2020-08-16 DIAGNOSIS — N5201 Erectile dysfunction due to arterial insufficiency: Secondary | ICD-10-CM | POA: Diagnosis not present

## 2020-08-16 DIAGNOSIS — N401 Enlarged prostate with lower urinary tract symptoms: Secondary | ICD-10-CM | POA: Diagnosis not present

## 2020-08-16 DIAGNOSIS — C61 Malignant neoplasm of prostate: Secondary | ICD-10-CM | POA: Diagnosis not present

## 2020-08-16 DIAGNOSIS — R350 Frequency of micturition: Secondary | ICD-10-CM | POA: Diagnosis not present

## 2020-08-20 DIAGNOSIS — J301 Allergic rhinitis due to pollen: Secondary | ICD-10-CM | POA: Diagnosis not present

## 2020-08-20 DIAGNOSIS — J3089 Other allergic rhinitis: Secondary | ICD-10-CM | POA: Diagnosis not present

## 2020-08-23 DIAGNOSIS — R030 Elevated blood-pressure reading, without diagnosis of hypertension: Secondary | ICD-10-CM | POA: Diagnosis not present

## 2020-08-23 DIAGNOSIS — N400 Enlarged prostate without lower urinary tract symptoms: Secondary | ICD-10-CM | POA: Diagnosis not present

## 2020-08-23 DIAGNOSIS — Z809 Family history of malignant neoplasm, unspecified: Secondary | ICD-10-CM | POA: Diagnosis not present

## 2020-08-23 DIAGNOSIS — Z833 Family history of diabetes mellitus: Secondary | ICD-10-CM | POA: Diagnosis not present

## 2020-08-23 DIAGNOSIS — F411 Generalized anxiety disorder: Secondary | ICD-10-CM | POA: Diagnosis not present

## 2020-08-23 DIAGNOSIS — N529 Male erectile dysfunction, unspecified: Secondary | ICD-10-CM | POA: Diagnosis not present

## 2020-08-23 DIAGNOSIS — Z8546 Personal history of malignant neoplasm of prostate: Secondary | ICD-10-CM | POA: Diagnosis not present

## 2020-08-23 DIAGNOSIS — Z8249 Family history of ischemic heart disease and other diseases of the circulatory system: Secondary | ICD-10-CM | POA: Diagnosis not present

## 2020-08-23 DIAGNOSIS — Z7901 Long term (current) use of anticoagulants: Secondary | ICD-10-CM | POA: Diagnosis not present

## 2020-09-03 DIAGNOSIS — J301 Allergic rhinitis due to pollen: Secondary | ICD-10-CM | POA: Diagnosis not present

## 2020-09-03 DIAGNOSIS — J3089 Other allergic rhinitis: Secondary | ICD-10-CM | POA: Diagnosis not present

## 2020-09-17 DIAGNOSIS — J3089 Other allergic rhinitis: Secondary | ICD-10-CM | POA: Diagnosis not present

## 2020-09-17 DIAGNOSIS — J301 Allergic rhinitis due to pollen: Secondary | ICD-10-CM | POA: Diagnosis not present

## 2020-10-02 DIAGNOSIS — J301 Allergic rhinitis due to pollen: Secondary | ICD-10-CM | POA: Diagnosis not present

## 2020-10-02 DIAGNOSIS — J3081 Allergic rhinitis due to animal (cat) (dog) hair and dander: Secondary | ICD-10-CM | POA: Diagnosis not present

## 2020-10-02 DIAGNOSIS — J3089 Other allergic rhinitis: Secondary | ICD-10-CM | POA: Diagnosis not present

## 2020-10-15 DIAGNOSIS — J3089 Other allergic rhinitis: Secondary | ICD-10-CM | POA: Diagnosis not present

## 2020-10-15 DIAGNOSIS — J301 Allergic rhinitis due to pollen: Secondary | ICD-10-CM | POA: Diagnosis not present

## 2020-10-17 DIAGNOSIS — Z23 Encounter for immunization: Secondary | ICD-10-CM | POA: Diagnosis not present

## 2020-10-29 DIAGNOSIS — J3089 Other allergic rhinitis: Secondary | ICD-10-CM | POA: Diagnosis not present

## 2020-10-29 DIAGNOSIS — J301 Allergic rhinitis due to pollen: Secondary | ICD-10-CM | POA: Diagnosis not present

## 2020-11-19 DIAGNOSIS — J3089 Other allergic rhinitis: Secondary | ICD-10-CM | POA: Diagnosis not present

## 2020-11-19 DIAGNOSIS — J301 Allergic rhinitis due to pollen: Secondary | ICD-10-CM | POA: Diagnosis not present

## 2020-11-25 DIAGNOSIS — J301 Allergic rhinitis due to pollen: Secondary | ICD-10-CM | POA: Diagnosis not present

## 2020-11-25 DIAGNOSIS — J3089 Other allergic rhinitis: Secondary | ICD-10-CM | POA: Diagnosis not present

## 2020-11-25 DIAGNOSIS — J3081 Allergic rhinitis due to animal (cat) (dog) hair and dander: Secondary | ICD-10-CM | POA: Diagnosis not present

## 2020-12-12 DIAGNOSIS — F411 Generalized anxiety disorder: Secondary | ICD-10-CM | POA: Diagnosis not present

## 2020-12-12 DIAGNOSIS — Z86711 Personal history of pulmonary embolism: Secondary | ICD-10-CM | POA: Diagnosis not present

## 2020-12-12 DIAGNOSIS — E7800 Pure hypercholesterolemia, unspecified: Secondary | ICD-10-CM | POA: Diagnosis not present

## 2020-12-12 DIAGNOSIS — Z79899 Other long term (current) drug therapy: Secondary | ICD-10-CM | POA: Diagnosis not present

## 2020-12-12 DIAGNOSIS — C61 Malignant neoplasm of prostate: Secondary | ICD-10-CM | POA: Diagnosis not present

## 2020-12-12 DIAGNOSIS — Z0001 Encounter for general adult medical examination with abnormal findings: Secondary | ICD-10-CM | POA: Diagnosis not present

## 2020-12-16 DIAGNOSIS — J301 Allergic rhinitis due to pollen: Secondary | ICD-10-CM | POA: Diagnosis not present

## 2020-12-16 DIAGNOSIS — J3089 Other allergic rhinitis: Secondary | ICD-10-CM | POA: Diagnosis not present

## 2020-12-30 ENCOUNTER — Other Ambulatory Visit: Payer: Self-pay

## 2020-12-30 ENCOUNTER — Telehealth: Payer: Self-pay

## 2020-12-30 DIAGNOSIS — M7989 Other specified soft tissue disorders: Secondary | ICD-10-CM

## 2020-12-30 NOTE — Telephone Encounter (Signed)
Pt called with c/o LLE anterior leg pain at night with itching and swollen vein. Denies LLE swelling other than of the vein. This has been ongoing for several months. Pt has been given a reflux u/s with APP appt for later this week, as he is going on a trip to [place] this month.

## 2021-01-02 ENCOUNTER — Ambulatory Visit (HOSPITAL_COMMUNITY)
Admission: RE | Admit: 2021-01-02 | Discharge: 2021-01-02 | Disposition: A | Discharge status: Home / Self Care | Payer: Medicare PPO | Source: Ambulatory Visit | Attending: Physician Assistant | Admitting: Physician Assistant

## 2021-01-02 ENCOUNTER — Other Ambulatory Visit: Payer: Self-pay

## 2021-01-02 ENCOUNTER — Ambulatory Visit: Payer: Medicare PPO | Admitting: Physician Assistant

## 2021-01-02 VITALS — BP 104/67 | HR 82 | Temp 97.7°F | Resp 20 | Ht 72.0 in | Wt 193.4 lb

## 2021-01-02 DIAGNOSIS — R062 Wheezing: Secondary | ICD-10-CM | POA: Insufficient documentation

## 2021-01-02 DIAGNOSIS — I872 Venous insufficiency (chronic) (peripheral): Secondary | ICD-10-CM

## 2021-01-02 DIAGNOSIS — I83893 Varicose veins of bilateral lower extremities with other complications: Secondary | ICD-10-CM | POA: Diagnosis not present

## 2021-01-02 DIAGNOSIS — H1045 Other chronic allergic conjunctivitis: Secondary | ICD-10-CM | POA: Insufficient documentation

## 2021-01-02 DIAGNOSIS — J301 Allergic rhinitis due to pollen: Secondary | ICD-10-CM | POA: Insufficient documentation

## 2021-01-02 DIAGNOSIS — M7989 Other specified soft tissue disorders: Secondary | ICD-10-CM | POA: Insufficient documentation

## 2021-01-02 DIAGNOSIS — J3081 Allergic rhinitis due to animal (cat) (dog) hair and dander: Secondary | ICD-10-CM | POA: Insufficient documentation

## 2021-01-02 NOTE — Progress Notes (Signed)
Requested by:  Lujean Amel, [place],  [place] [address]  Reason for consultation: painful, itchy and swollen veins   History of Present Illness   Spencer Ross is a 70 y.o. (07/17/1950) male who presents for evaluation of of varicose vein in left leg with itching, pain and swelling. Says that over past couple months he has had an aching pain in both the outer left lower leg and also left hip. He says this occurs mostly during the night. It rarely wakes him up from sleep but he says he gets up frequently to void and that is when he notices the discomfort. He says it is worse when he lays on his left side and is improved when lying on his right. He does have similar discomfort in his right hip but no right leg pain. This pain is not present on ambulation. Additionally he has noticed increased prominence/ swelling  of the veins of his left leg and the veins seem to be itching. He does not elevate and has not ever worn compression stockings. 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 He has some possible family history of venous disease in grandfather and mother. No family history of DVT. He was found to have PE about 8 years ago. Says they never found source but he has been managed on Xarelto since  Venous symptoms include: aching, itching, swelling in varicose vein Onset/duration:  < 6 months  Occupation:  Clinical biochemist Aggravating factors: (sitting, standing) Alleviating factors: (elevation) Compression:  no Helps:  n/a Pain medications:  none Previous vein procedures:  none History of DVT:  No but hx of PE, on Xarelto  Past Medical History:  Diagnosis Date   Hypercholesteremia    Pulmonary embolism (HCC)     Past Surgical History:  Procedure Laterality Date   KNEE SURGERY     right    Social History    Socioeconomic History   Marital status: Married    Spouse name: Not on file   Number of children: Not on file   Years of education: Not on file   Highest education level: Not on file  Occupational History   Not on file  Tobacco Use   Smoking status: Never   Smokeless tobacco: Never  Vaping Use   Vaping Use: Never used  Substance and Sexual Activity   Alcohol use: No   Drug use: No   Sexual activity: Not on file  Other Topics Concern   Not on file  Social History Narrative   Not on file   Social Determinants of Health   Financial Resource Strain: Not on file  Food Insecurity: Not on file  Transportation Needs: Not on file  Physical Activity: Not on file  Stress: Not on file  Social Connections: Not on file  Intimate Partner Violence: Not on file    Family History  Problem Relation Age of Onset   Heart disease Father    Asthma Paternal Grandfather     Current Outpatient Medications  Medication Sig Dispense Refill   Alfalfa 250 MG TABS Take 3 tablets by mouth daily.     Ascorbic Acid (VITAMIN C) 1000 MG tablet Take 1,000 mg by mouth daily.     cholecalciferol (VITAMIN D3) 25 MCG (1000 UNIT) tablet Take 1,000 Units by mouth daily.     finasteride (PROSCAR)  5 MG tablet TK 1 T PO QD     magnesium citrate SOLN Take by mouth once. Pt dilutes and uses as a spray.     Multiple Vitamin (MULTIVITAMIN WITH MINERALS) TABS tablet Take 1 tablet by mouth daily.     Omega-3 Fatty Acids (FISH OIL) 1000 MG CAPS Take 1,000 mg by mouth 2 (two) times a week.     omeprazole (PRILOSEC) 20 MG capsule Take 20 mg by mouth daily.     Rivaroxaban (XARELTO) 20 MG TABS tablet Take 1 tablet (20 mg total) by mouth daily with supper. Start taking March 29th, 2015 30 tablet 3   saw palmetto 160 MG capsule Take 160 mg by mouth daily.     traZODone (DESYREL) 50 MG tablet Take 50 mg by mouth at bedtime as needed for sleep. Pt takes 1/2 pill     vitamin E 200 UNIT capsule Take 200 Units by mouth  daily.     zinc gluconate 50 MG tablet Take 50 mg by mouth daily.     No current facility-administered medications for this visit.    Allergies  Allergen Reactions   Diphenhydramine     Other reaction(s): prostate swelling, increase urination   Penicillins Other (See Comments)    "childhood reaction from mother"   Trazodone Hcl     Other reaction(s): weak stream    REVIEW OF SYSTEMS (negative unless checked):   Cardiac:  []  Chest pain or chest pressure? []  Shortness of breath upon activity? []  Shortness of breath when lying flat? []  Irregular heart rhythm?  Vascular:  []  Pain in calf, thigh, or hip brought on by walking? []  Pain in feet at night that wakes you up from your sleep? []  Blood clot in your veins? x Leg swelling?  Pulmonary:  []  Oxygen at home? []  Productive cough? []  Wheezing?  Neurologic:  []  Sudden weakness in arms or legs? []  Sudden numbness in arms or legs? []  Sudden onset of difficult speaking or slurred speech? []  Temporary loss of vision in one eye? []  Problems with dizziness?  Gastrointestinal:  []  Blood in stool? []  Vomited blood?  Genitourinary:  []  Burning when urinating? []  Blood in urine?  Psychiatric:  []  Major depression  Hematologic:  []  Bleeding problems? []  Problems with blood clotting?  Dermatologic:  []  Rashes or ulcers?  Constitutional:  []  Fever or chills?  Ear/Nose/Throat:  []  Change in hearing? []  Nose bleeds? []  Sore throat?  Musculoskeletal:  []  Back pain? []  Joint pain? []  Muscle pain?   Physical Examination     Vitals:   01/02/21 0857  BP: 104/67  Pulse: 82  Resp: 20  Temp: 97.7 F (36.5 C)  TempSrc: Temporal  SpO2: 96%  Weight: 193 lb 6.4 oz (87.7 kg)  Height: 6' (1.829 m)   Body mass index is 26.23 kg/m.  General:  WDWN in NAD; vital signs documented above Gait: Normal HENT: WNL, normocephalic Pulmonary: normal non-labored breathing , without wheezing Cardiac: regular HR, without   Murmurs without carotid bruit Abdomen: soft, NT, no masses Vascular Exam/Pulses:2+ radial pulses bilaterally, 2+ pedal pulses bilaterally. Feet warm and well perfused Extremities: with varicose veins of left greater than right leg, without reticular veins, without edema, without stasis pigmentation, without lipodermatosclerosis, without ulcers Musculoskeletal: no muscle wasting or atrophy  Neurologic: A&O X 3;  No focal weakness or paresthesias are detected Psychiatric:  The pt has Normal affect.  Non-invasive Vascular Imaging   BLE Venous Insufficiency Duplex (01/02/21):   LLE:  DVT and SVT GSV reflux  GSV diameter 0.15-0.56 No SSV reflux  CFV deep venous reflux   Medical Decision Making   TRAYDEN BRANDY is a 70 y.o. male who presents with: LLE chronic venous insufficiency with varicose veins with itching, swelling, and pain  Based on the patient's history and examination, I recommend: daily elevation of legs for 20-30 minutes above level of heart, compression stockings, exercise, and refraining from prolonged sitting or standing I discussed with the patient the use of her 20-30 mm thigh high compression stockings and need for 3 month trial of such. The patient will follow up in 3 months with Physician who will further evaluate patient for lazer ablation Thank you for allowing [place] to participate in this patient's care.   Karoline Caldwell, PA-C [facility] Office: [phone]  01/02/2021, 9:36 AM  Clinic MD: Scot Dock

## 2021-01-07 DIAGNOSIS — J3089 Other allergic rhinitis: Secondary | ICD-10-CM | POA: Diagnosis not present

## 2021-01-07 DIAGNOSIS — J301 Allergic rhinitis due to pollen: Secondary | ICD-10-CM | POA: Diagnosis not present

## 2021-01-09 DIAGNOSIS — I8393 Asymptomatic varicose veins of bilateral lower extremities: Secondary | ICD-10-CM

## 2021-01-21 ENCOUNTER — Other Ambulatory Visit: Payer: Self-pay | Admitting: Urology

## 2021-01-21 DIAGNOSIS — C61 Malignant neoplasm of prostate: Secondary | ICD-10-CM

## 2021-01-28 DIAGNOSIS — J301 Allergic rhinitis due to pollen: Secondary | ICD-10-CM | POA: Diagnosis not present

## 2021-01-28 DIAGNOSIS — Z85828 Personal history of other malignant neoplasm of skin: Secondary | ICD-10-CM | POA: Diagnosis not present

## 2021-01-28 DIAGNOSIS — J3089 Other allergic rhinitis: Secondary | ICD-10-CM | POA: Diagnosis not present

## 2021-01-28 DIAGNOSIS — L821 Other seborrheic keratosis: Secondary | ICD-10-CM | POA: Diagnosis not present

## 2021-01-28 DIAGNOSIS — D225 Melanocytic nevi of trunk: Secondary | ICD-10-CM | POA: Diagnosis not present

## 2021-01-28 DIAGNOSIS — D1801 Hemangioma of skin and subcutaneous tissue: Secondary | ICD-10-CM | POA: Diagnosis not present

## 2021-01-28 DIAGNOSIS — L738 Other specified follicular disorders: Secondary | ICD-10-CM | POA: Diagnosis not present

## 2021-01-28 DIAGNOSIS — Q859 Phakomatosis, unspecified: Secondary | ICD-10-CM | POA: Diagnosis not present

## 2021-01-28 DIAGNOSIS — L570 Actinic keratosis: Secondary | ICD-10-CM | POA: Diagnosis not present

## 2021-01-28 DIAGNOSIS — D485 Neoplasm of uncertain behavior of skin: Secondary | ICD-10-CM | POA: Diagnosis not present

## 2021-02-14 DIAGNOSIS — J301 Allergic rhinitis due to pollen: Secondary | ICD-10-CM | POA: Diagnosis not present

## 2021-02-14 DIAGNOSIS — J3089 Other allergic rhinitis: Secondary | ICD-10-CM | POA: Diagnosis not present

## 2021-03-06 DIAGNOSIS — R062 Wheezing: Secondary | ICD-10-CM | POA: Diagnosis not present

## 2021-03-06 DIAGNOSIS — H1045 Other chronic allergic conjunctivitis: Secondary | ICD-10-CM | POA: Diagnosis not present

## 2021-03-06 DIAGNOSIS — J3089 Other allergic rhinitis: Secondary | ICD-10-CM | POA: Diagnosis not present

## 2021-03-06 DIAGNOSIS — J301 Allergic rhinitis due to pollen: Secondary | ICD-10-CM | POA: Diagnosis not present

## 2021-03-12 ENCOUNTER — Ambulatory Visit
Admission: RE | Admit: 2021-03-12 | Discharge: 2021-03-12 | Disposition: A | Discharge status: Home / Self Care | Payer: Medicare PPO | Source: Ambulatory Visit | Attending: Urology | Admitting: Urology

## 2021-03-12 DIAGNOSIS — J301 Allergic rhinitis due to pollen: Secondary | ICD-10-CM | POA: Diagnosis not present

## 2021-03-12 DIAGNOSIS — C61 Malignant neoplasm of prostate: Secondary | ICD-10-CM

## 2021-03-12 DIAGNOSIS — J3089 Other allergic rhinitis: Secondary | ICD-10-CM | POA: Diagnosis not present

## 2021-03-12 MED ORDER — GADOBENATE DIMEGLUMINE 529 MG/ML IV SOLN
18.0000 mL | Freq: Once | INTRAVENOUS | Status: AC | PRN
  Administered 2021-03-12: 18 mL via INTRAVENOUS

## 2021-03-18 DIAGNOSIS — C61 Malignant neoplasm of prostate: Secondary | ICD-10-CM | POA: Diagnosis not present

## 2021-03-21 DIAGNOSIS — C61 Malignant neoplasm of prostate: Secondary | ICD-10-CM | POA: Diagnosis not present

## 2021-03-21 DIAGNOSIS — N401 Enlarged prostate with lower urinary tract symptoms: Secondary | ICD-10-CM | POA: Diagnosis not present

## 2021-03-21 DIAGNOSIS — R350 Frequency of micturition: Secondary | ICD-10-CM | POA: Diagnosis not present

## 2021-03-28 DIAGNOSIS — R310 Gross hematuria: Secondary | ICD-10-CM | POA: Diagnosis not present

## 2021-04-02 DIAGNOSIS — J3089 Other allergic rhinitis: Secondary | ICD-10-CM | POA: Diagnosis not present

## 2021-04-02 DIAGNOSIS — J301 Allergic rhinitis due to pollen: Secondary | ICD-10-CM | POA: Diagnosis not present

## 2021-04-07 ENCOUNTER — Other Ambulatory Visit: Payer: Self-pay

## 2021-04-07 ENCOUNTER — Ambulatory Visit: Payer: Medicare PPO | Admitting: Surgery

## 2021-04-07 ENCOUNTER — Encounter: Payer: Self-pay | Admitting: Surgery

## 2021-04-07 VITALS — BP 113/73 | HR 69 | Temp 98.4°F | Resp 20 | Ht 72.5 in | Wt 195.0 lb

## 2021-04-07 DIAGNOSIS — I83893 Varicose veins of bilateral lower extremities with other complications: Secondary | ICD-10-CM

## 2021-04-07 NOTE — Progress Notes (Signed)
? ?[facility] ? ?Patient name: Spencer Ross MRN: [id number] DOB: 08/12/1950 Sex: male ? ? ?REASON FOR VISIT:  ? ? ?Follow up ? ?HISOTRY OF PRESENT ILLNESS:  ? ? ?Spencer Ross is a 71 y.o. male who is back today for follow-up.  He was last seen 3 months ago for painful, itchy, and swollen legs.  It began several months prior to his visit.  He has noticed an increased prominence/swelling of the veins in his leg which have started to itch.  There is a venous history in his family.  He also has a history of pulmonary embolism 8 years ago.  He has been on Xarelto ever since.  He did not have a DVT that was found at that time. ? ?When he initially presented he was having some thigh pain and calf pain which is now resolved.  He feels that the compression socks did help his symptoms. ? ?PAST MEDICAL HISTORY:  ? ?Past Medical History:  ?Diagnosis Date  ? Hypercholesteremia   ? Pulmonary embolism ([place])   ? ? ? ?FAMILY HISTORY:  ? ?Family History  ?Problem Relation Age of Onset  ? Heart disease Father   ? Asthma Paternal Grandfather   ? ? ?SOCIAL HISTORY:  ? ?Social History  ? ?Tobacco Use  ? Smoking status: Never  ? Smokeless tobacco: Never  ?Substance Use Topics  ? Alcohol use: No  ? ? ? ?ALLERGIES:  ? ?Allergies  ?Allergen Reactions  ? Diphenhydramine   ?  Other reaction(s): prostate swelling, increase urination  ? Penicillins Other (See Comments)  ?  "childhood reaction from mother"  ? Trazodone Hcl   ?  Other reaction(s): weak stream  ? ? ? ?CURRENT MEDICATIONS:  ? ?Current Outpatient Medications  ?Medication Sig Dispense Refill  ? Ascorbic Acid (VITAMIN C) 1000 MG tablet Take 1,000 mg by mouth daily.    ? cholecalciferol (VITAMIN D3) 25 MCG (1000 UNIT) tablet Take 1,000 Units by mouth daily.    ? magnesium citrate SOLN Take by mouth once. Pt dilutes and uses as a spray.    ? Multiple Vitamin (MULTIVITAMIN WITH MINERALS) TABS tablet Take 1 tablet by  mouth daily.    ? omeprazole (PRILOSEC) 20 MG capsule Take 20 mg by mouth daily.    ? Rivaroxaban (XARELTO) 20 MG TABS tablet Take 1 tablet (20 mg total) by mouth daily with supper. Start taking March 29th, 2015 30 tablet 3  ? saw palmetto 160 MG capsule Take 160 mg by mouth daily.    ? traZODone (DESYREL) 50 MG tablet Take 50 mg by mouth at bedtime as needed for sleep. Pt takes 1/2 pill    ? zinc gluconate 50 MG tablet Take 50 mg by mouth daily.    ? ?No current facility-administered medications for this visit.  ? ? ?REVIEW OF SYSTEMS:  ? ?'[X]'$  denotes positive finding, '[ ]'$  denotes negative finding ?Cardiac  Comments:  ?Chest pain or chest pressure:    ?Shortness of breath upon exertion:    ?Short of breath when lying flat:    ?Irregular heart rhythm:    ?    ?Vascular    ?Pain in calf, thigh, or hip brought on by ambulation:    ?Pain in feet at night that wakes you up from your sleep:     ?Blood clot in your veins:    ?Leg swelling:  x   ?    ?Pulmonary    ?Oxygen at home:    ?  Productive cough:     ?Wheezing:     ?    ?Neurologic    ?Sudden weakness in arms or legs:     ?Sudden numbness in arms or legs:     ?Sudden onset of difficulty speaking or slurred speech:    ?Temporary loss of vision in one eye:     ?Problems with dizziness:     ?    ?Gastrointestinal    ?Blood in stool:     ?Vomited blood:     ?    ?Genitourinary    ?Burning when urinating:     ?Blood in urine:    ?    ?Psychiatric    ?Major depression:     ?    ?Hematologic    ?Bleeding problems:    ?Problems with blood clotting too easily:    ?    ?Skin    ?Rashes or ulcers:    ?    ?Constitutional    ?Fever or chills:    ? ? ?PHYSICAL EXAM:  ? ?Vitals:  ? 04/07/21 1511  ?BP: 113/73  ?Pulse: 69  ?Resp: 20  ?Temp: 98.4 ?F (36.9 ?C)  ?SpO2: 95%  ?Weight: 195 lb (88.5 kg)  ?Height: 6' 0.5" (1.842 m)  ? ? ?GENERAL: The patient is a well-nourished male, in no acute distress. The vital signs are documented above. ?CARDIAC: There is a regular rate and rhythm.   ?VASCULAR: Palpable pedal pulses.  No significant edema.  Small prominent surface veins particularly on the left leg, nontender ?PULMONARY: Non-labored respirations ?ABDOMEN: Soft and non-tender with normal pitched bowel sounds.  ?MUSCULOSKELETAL: There are no major deformities or cyanosis. ?NEUROLOGIC: No focal weakness or paresthesias are detected. ?SKIN: There are no ulcers or rashes noted. ?PSYCHIATRIC: The patient has a normal affect. ? ?STUDIES:  ? ?I have reviewed the following; ?+--------------+---------+------+-----------+------------+--------+  ?LEFT          Reflux NoRefluxReflux TimeDiameter cmsComments  ?                        Yes                                   ?+--------------+---------+------+-----------+------------+--------+  ?CFV                     yes   >1 second                       ?+--------------+---------+------+-----------+------------+--------+  ?FV mid        no                                              ?+--------------+---------+------+-----------+------------+--------+  ?Popliteal     no                                              ?+--------------+---------+------+-----------+------------+--------+  ?GSV at [facility]              yes    >500 ms      0.63              ?+--------------+---------+------+-----------+------------+--------+  ?GSV prox  thighno                            0.17              ?+--------------+---------+------+-----------+------------+--------+  ?GSV mid thigh no                            0.15              ?+--------------+---------+------+-----------+------------+--------+  ?GSV dist thigh          yes    >500 ms      0.56              ?+--------------+---------+------+-----------+------------+--------+  ?GSV at knee             yes    >500 ms      0.44              ?+--------------+---------+------+-----------+------------+--------+  ?GSV prox calf           yes    >500 ms      0.27               ?+--------------+---------+------+-----------+------------+--------+  ?GSV mid calf            yes    >500 ms      0.35              ?+--------------+---------+------+-----------+------------+--------+  ?SSV Pop Fossa no                            0.21              ?+--------------+---------+------+-----------+------------+--------+  ?SSV prox calf no                            0.16              ?+--------------+---------+------+-----------+------------+--------+  ?SSV mid calf  no                            0.16              ?+--------------+---------+------+-----------+------------+--------+  ?AASV          no                            0.38              ?+--------------+---------+------+-----------+------------+--------+  ? ?MEDICAL ISSUES:  ? ?Varicose veins: The patient does have mild superficial venous reflux on the left side, however I do not think that this has progressed to the point where he would benefit from intervention.  In addition, his painful areas on his leg are improving.  I have recommended that he wear compression socks and contact me if he has a change in symptoms or develops worsening edema. ? ? ? ?Annamarie Major, IV, MD, FACS ?[facility] ?Tel [phone] ?Pager [phone]  ?

## 2021-04-10 DIAGNOSIS — R310 Gross hematuria: Secondary | ICD-10-CM | POA: Diagnosis not present

## 2021-04-10 DIAGNOSIS — N289 Disorder of kidney and ureter, unspecified: Secondary | ICD-10-CM | POA: Diagnosis not present

## 2021-04-10 DIAGNOSIS — K7689 Other specified diseases of liver: Secondary | ICD-10-CM | POA: Diagnosis not present

## 2021-04-22 ENCOUNTER — Other Ambulatory Visit: Payer: Self-pay | Admitting: *Deleted

## 2021-04-22 DIAGNOSIS — D376 Neoplasm of uncertain behavior of liver, gallbladder and bile ducts: Secondary | ICD-10-CM

## 2021-04-22 DIAGNOSIS — D49512 Neoplasm of unspecified behavior of left kidney: Secondary | ICD-10-CM

## 2021-04-25 DIAGNOSIS — J3081 Allergic rhinitis due to animal (cat) (dog) hair and dander: Secondary | ICD-10-CM | POA: Diagnosis not present

## 2021-04-25 DIAGNOSIS — J3089 Other allergic rhinitis: Secondary | ICD-10-CM | POA: Diagnosis not present

## 2021-04-25 DIAGNOSIS — J301 Allergic rhinitis due to pollen: Secondary | ICD-10-CM | POA: Diagnosis not present

## 2021-05-14 ENCOUNTER — Ambulatory Visit
Admission: RE | Admit: 2021-05-14 | Discharge: 2021-05-14 | Disposition: A | Discharge status: Home / Self Care | Payer: Medicare PPO | Source: Ambulatory Visit | Attending: Nurse Practitioner | Admitting: Nurse Practitioner

## 2021-05-14 DIAGNOSIS — M47816 Spondylosis without myelopathy or radiculopathy, lumbar region: Secondary | ICD-10-CM | POA: Diagnosis not present

## 2021-05-14 DIAGNOSIS — K7689 Other specified diseases of liver: Secondary | ICD-10-CM | POA: Diagnosis not present

## 2021-05-14 DIAGNOSIS — D376 Neoplasm of uncertain behavior of liver, gallbladder and bile ducts: Secondary | ICD-10-CM

## 2021-05-14 DIAGNOSIS — C61 Malignant neoplasm of prostate: Secondary | ICD-10-CM | POA: Diagnosis not present

## 2021-05-14 DIAGNOSIS — D49512 Neoplasm of unspecified behavior of left kidney: Secondary | ICD-10-CM

## 2021-05-14 DIAGNOSIS — D1803 Hemangioma of intra-abdominal structures: Secondary | ICD-10-CM | POA: Diagnosis not present

## 2021-05-14 MED ORDER — GADOBENATE DIMEGLUMINE 529 MG/ML IV SOLN
18.0000 mL | Freq: Once | INTRAVENOUS | Status: AC | PRN
  Administered 2021-05-14: 18 mL via INTRAVENOUS

## 2021-05-19 DIAGNOSIS — J3089 Other allergic rhinitis: Secondary | ICD-10-CM | POA: Diagnosis not present

## 2021-05-19 DIAGNOSIS — J301 Allergic rhinitis due to pollen: Secondary | ICD-10-CM | POA: Diagnosis not present

## 2021-05-30 DIAGNOSIS — R310 Gross hematuria: Secondary | ICD-10-CM | POA: Diagnosis not present

## 2021-06-06 DIAGNOSIS — J3081 Allergic rhinitis due to animal (cat) (dog) hair and dander: Secondary | ICD-10-CM | POA: Diagnosis not present

## 2021-06-06 DIAGNOSIS — J301 Allergic rhinitis due to pollen: Secondary | ICD-10-CM | POA: Diagnosis not present

## 2021-06-06 DIAGNOSIS — J3089 Other allergic rhinitis: Secondary | ICD-10-CM | POA: Diagnosis not present

## 2021-06-18 DIAGNOSIS — H25013 Cortical age-related cataract, bilateral: Secondary | ICD-10-CM | POA: Diagnosis not present

## 2021-06-18 DIAGNOSIS — H2513 Age-related nuclear cataract, bilateral: Secondary | ICD-10-CM | POA: Diagnosis not present

## 2021-06-18 DIAGNOSIS — H5203 Hypermetropia, bilateral: Secondary | ICD-10-CM | POA: Diagnosis not present

## 2021-06-18 DIAGNOSIS — H524 Presbyopia: Secondary | ICD-10-CM | POA: Diagnosis not present

## 2021-06-26 DIAGNOSIS — J301 Allergic rhinitis due to pollen: Secondary | ICD-10-CM | POA: Diagnosis not present

## 2021-06-26 DIAGNOSIS — J3089 Other allergic rhinitis: Secondary | ICD-10-CM | POA: Diagnosis not present

## 2021-06-26 DIAGNOSIS — J3081 Allergic rhinitis due to animal (cat) (dog) hair and dander: Secondary | ICD-10-CM | POA: Diagnosis not present

## 2021-07-08 ENCOUNTER — Ambulatory Visit: Payer: Medicare PPO | Admitting: Orthopaedic Surgery

## 2021-07-08 ENCOUNTER — Encounter: Payer: Self-pay | Admitting: Orthopaedic Surgery

## 2021-07-08 ENCOUNTER — Ambulatory Visit (INDEPENDENT_AMBULATORY_CARE_PROVIDER_SITE_OTHER): Payer: Medicare PPO

## 2021-07-08 DIAGNOSIS — G8929 Other chronic pain: Secondary | ICD-10-CM

## 2021-07-08 DIAGNOSIS — M25561 Pain in right knee: Secondary | ICD-10-CM

## 2021-07-08 DIAGNOSIS — M7062 Trochanteric bursitis, left hip: Secondary | ICD-10-CM | POA: Diagnosis not present

## 2021-07-08 DIAGNOSIS — M1711 Unilateral primary osteoarthritis, right knee: Secondary | ICD-10-CM

## 2021-07-08 MED ORDER — METHYLPREDNISOLONE ACETATE 40 MG/ML IJ SUSP
40.0000 mg | INTRAMUSCULAR | Status: AC | PRN
  Administered 2021-07-08: 40 mg via INTRA_ARTICULAR

## 2021-07-08 MED ORDER — LIDOCAINE HCL 1 % IJ SOLN
3.0000 mL | INTRAMUSCULAR | Status: AC | PRN
  Administered 2021-07-08: 3 mL

## 2021-07-08 NOTE — Progress Notes (Signed)
Office Visit Note   Patient: Spencer Ross           Date of Birth: 08-13-1950           MRN: [id number] Visit Date: 07/08/2021              Requested by: Lujean Amel, MD [place],  [place] [address] PCP: Lujean Amel, MD   Assessment & Plan: Visit Diagnoses:  1. Chronic pain of right knee   2. Unilateral primary osteoarthritis, right knee   3. Trochanteric bursitis, left hip     Plan: I went over his x-rays in detail.  He wants to try to stay conservative with his knee and I agree with this for now.  He has not had a steroid injection a long period time.  I recommend a steroid injection in his right knee and his left hip trochanteric area.  He agreed to these and tolerated them well.  I recommended Voltaren gel which is a topical anti-inflammatory which will be safer for him to try on the medial aspect of his right knee and over his left hip trochanteric area.  Also showed him stretching exercises to try.  He is a candidate for medial off loading brace which is an osteoarthritis brace for his left knee since he does have varus malalignment that is correctable and his main arthritis is the medial compartment/medial joint line.  I will see him back in 3 months to see how he is doing overall.  All question and concerns were answered and addressed.  Follow-Up Instructions: Return in about 3 months (around 10/08/2021).   Orders:  Orders Placed This Encounter  Procedures   Large Joint Inj   Large Joint Inj   XR Knee 1-2 Views Right   No orders of the defined types were placed in this encounter.     Procedures: Large Joint Inj: R knee on 07/08/2021 10:46 AM Indications: diagnostic evaluation and pain Details: 22 G 1.5 in needle, superolateral approach  Arthrogram: No  Medications: 3 mL lidocaine 1 %; 40 mg methylPREDNISolone acetate 40 MG/ML Outcome: tolerated well, no immediate complications Procedure, treatment alternatives, risks and  benefits explained, specific risks discussed. Consent was given by the patient. Immediately prior to procedure a time out was called to verify the correct patient, procedure, equipment, support staff and site/side marked as required. Patient was prepped and draped in the usual sterile fashion.    Large Joint Inj: L greater trochanter on 07/08/2021 10:49 AM Indications: pain and diagnostic evaluation Details: 22 G 1.5 in needle, lateral approach  Arthrogram: No  Medications: 3 mL lidocaine 1 %; 40 mg methylPREDNISolone acetate 40 MG/ML Outcome: tolerated well, no immediate complications Procedure, treatment alternatives, risks and benefits explained, specific risks discussed. Consent was given by the patient. Immediately prior to procedure a time out was called to verify the correct patient, procedure, equipment, support staff and site/side marked as required. Patient was prepped and draped in the usual sterile fashion.       Clinical Data: No additional findings.   Subjective: Chief Complaint  Patient presents with   Right Knee - Pain  The patient is a 71 year old gentleman who actually is in the neighborhood I am seeing for the first time as a patient.  He has a long history of right knee pain this been chronic.  He has pain behind the knee and on the medial aspect of the knee.  He has a history of  a knee arthroscopy for partial medial meniscectomy and meniscal tear about 10 to 12 years ago.  3 years ago he had stem cells placed in that knee in [place].  He denies any swelling but mainly gets pain with weightbearing activities.  Its been slowly getting worse and is detrimentally affecting his mobility, his quality of life and his actives daily living.  He has had hyaluronic acid in the past and that has not helped.  He does report left hip pain over the lateral aspect of his left hip that hurts at night when he lays down on that hip.  He is a thin individual.  He is not a diabetic.  He is  on chronic Xarelto due to a history of bilateral PEs years ago.  He can come off of that medication for surgery if warranted.  HPI  Review of Systems There is currently listed no fever, chills, nausea, vomiting  Objective: Vital Signs: There were no vitals taken for this visit.  Physical Exam He is alert and orient x3 and in no acute distress Ortho Exam Examination of his left hip shows full internal and external rotation with no pain in the groin at all.  There is pain to palpation over the trochanteric area of the hip and proximal IT band.  His right knee has varus malalignment that is correctable.  There is significant medial joint line tenderness.  His knee is ligamentously stable. Specialty Comments:  No specialty comments available.  Imaging: XR Knee 1-2 Views Right  Result Date: 07/08/2021 2 views of the right knee show tricompartment arthritis.  There is valgus malalignment.  There is narrowing of the medial joint space and the patellofemoral joint.  There are osteophytes in all 3 compartments.    PMFS History: Patient Active Problem List   Diagnosis Date Noted   Unilateral primary osteoarthritis, right knee 07/08/2021   Allergic rhinitis due to animal (cat) (dog) hair and dander 01/02/2021   Allergic rhinitis due to pollen 01/02/2021   Chronic allergic conjunctivitis 01/02/2021   Wheezing 01/02/2021   Bilateral high frequency sensorineural hearing loss 12/01/2018   Subjective tinnitus of left ear 12/01/2018   Allergic rhinitis 11/10/2013   GERD (gastroesophageal reflux disease) 11/10/2013   Antiphospholipid syndrome ([place]) 10/07/2013   Pulmonary emboli ([place]) 03/30/2013   Pleuritic chest pain 03/30/2013   Cough, persistent 03/30/2013   FHx: factor V Leiden mutation 03/30/2013   Past Medical History:  Diagnosis Date   Hypercholesteremia    Pulmonary embolism ([place])     Family History  Problem Relation Age of Onset   Heart disease Father    Asthma Paternal  Grandfather     Past Surgical History:  Procedure Laterality Date   KNEE SURGERY     right   Social History   Occupational History   Not on file  Tobacco Use   Smoking status: Never   Smokeless tobacco: Never  Vaping Use   Vaping Use: Never used  Substance and Sexual Activity   Alcohol use: No   Drug use: No   Sexual activity: Not on file

## 2021-07-17 DIAGNOSIS — J3081 Allergic rhinitis due to animal (cat) (dog) hair and dander: Secondary | ICD-10-CM | POA: Diagnosis not present

## 2021-07-17 DIAGNOSIS — J301 Allergic rhinitis due to pollen: Secondary | ICD-10-CM | POA: Diagnosis not present

## 2021-07-17 DIAGNOSIS — J3089 Other allergic rhinitis: Secondary | ICD-10-CM | POA: Diagnosis not present

## 2021-08-07 DIAGNOSIS — J3089 Other allergic rhinitis: Secondary | ICD-10-CM | POA: Diagnosis not present

## 2021-08-07 DIAGNOSIS — J301 Allergic rhinitis due to pollen: Secondary | ICD-10-CM | POA: Diagnosis not present

## 2021-08-07 DIAGNOSIS — J3081 Allergic rhinitis due to animal (cat) (dog) hair and dander: Secondary | ICD-10-CM | POA: Diagnosis not present

## 2021-08-28 DIAGNOSIS — J301 Allergic rhinitis due to pollen: Secondary | ICD-10-CM | POA: Diagnosis not present

## 2021-08-28 DIAGNOSIS — J3089 Other allergic rhinitis: Secondary | ICD-10-CM | POA: Diagnosis not present

## 2021-08-29 DIAGNOSIS — J3089 Other allergic rhinitis: Secondary | ICD-10-CM | POA: Diagnosis not present

## 2021-08-29 DIAGNOSIS — J301 Allergic rhinitis due to pollen: Secondary | ICD-10-CM | POA: Diagnosis not present

## 2021-09-16 DIAGNOSIS — L858 Other specified epidermal thickening: Secondary | ICD-10-CM | POA: Diagnosis not present

## 2021-09-16 DIAGNOSIS — L821 Other seborrheic keratosis: Secondary | ICD-10-CM | POA: Diagnosis not present

## 2021-09-18 DIAGNOSIS — J3081 Allergic rhinitis due to animal (cat) (dog) hair and dander: Secondary | ICD-10-CM | POA: Diagnosis not present

## 2021-09-18 DIAGNOSIS — J3089 Other allergic rhinitis: Secondary | ICD-10-CM | POA: Diagnosis not present

## 2021-09-18 DIAGNOSIS — J301 Allergic rhinitis due to pollen: Secondary | ICD-10-CM | POA: Diagnosis not present

## 2021-10-01 DIAGNOSIS — C61 Malignant neoplasm of prostate: Secondary | ICD-10-CM | POA: Diagnosis not present

## 2021-10-08 DIAGNOSIS — C61 Malignant neoplasm of prostate: Secondary | ICD-10-CM | POA: Diagnosis not present

## 2021-10-08 DIAGNOSIS — R310 Gross hematuria: Secondary | ICD-10-CM | POA: Diagnosis not present

## 2021-10-08 DIAGNOSIS — N401 Enlarged prostate with lower urinary tract symptoms: Secondary | ICD-10-CM | POA: Diagnosis not present

## 2021-10-08 DIAGNOSIS — R350 Frequency of micturition: Secondary | ICD-10-CM | POA: Diagnosis not present

## 2021-10-09 ENCOUNTER — Encounter: Payer: Self-pay | Admitting: Orthopaedic Surgery

## 2021-10-09 ENCOUNTER — Ambulatory Visit: Payer: Medicare PPO | Admitting: Orthopaedic Surgery

## 2021-10-09 DIAGNOSIS — G8929 Other chronic pain: Secondary | ICD-10-CM

## 2021-10-09 DIAGNOSIS — M25561 Pain in right knee: Secondary | ICD-10-CM

## 2021-10-09 DIAGNOSIS — M1711 Unilateral primary osteoarthritis, right knee: Secondary | ICD-10-CM | POA: Diagnosis not present

## 2021-10-09 NOTE — Progress Notes (Signed)
The patient is a 71 year old gentleman well-known to me.  He has moderate arthritis of his right knee with varus malalignment.  He does have an offloading medial compartment osteoarthritis brace to wear as time to time.  He had a steroid injection in that knee 3 months ago.  He says this worked a little bit.  He has had stem cells about 3 years ago and that right knee that was ordered by someone else.  He is never had hyaluronic acid.  He does report both of his Achilles have been sore with his right worse than his left.  Both shoulders hurt with overhead activities.  Both shoulders have evidence of impingement syndrome.  Both Achilles are intact but are painful showing Achilles tendinitis.  His right knee has varus malalignment that is correctable with no effusion.  He would definitely benefit from outpatient physical therapy for his bilateral Achilles tendinitis as well as bilateral shoulder impingement.  I also think he would benefit from hyaluronic acid for the right knee since he has not had this before and he agrees with this treatment plan.  We will work on setting up physical therapy with any modalities per therapist discretion for his shoulders and his Achilles.  We will also order hyaluronic acid for his right knee to treat the pain from moderate osteoarthritis.

## 2021-10-10 ENCOUNTER — Other Ambulatory Visit: Payer: Self-pay

## 2021-10-10 ENCOUNTER — Telehealth: Payer: Self-pay

## 2021-10-10 DIAGNOSIS — M7662 Achilles tendinitis, left leg: Secondary | ICD-10-CM

## 2021-10-10 DIAGNOSIS — G8929 Other chronic pain: Secondary | ICD-10-CM

## 2021-10-10 DIAGNOSIS — M9902 Segmental and somatic dysfunction of thoracic region: Secondary | ICD-10-CM | POA: Diagnosis not present

## 2021-10-10 DIAGNOSIS — M5137 Other intervertebral disc degeneration, lumbosacral region: Secondary | ICD-10-CM | POA: Diagnosis not present

## 2021-10-10 DIAGNOSIS — M9903 Segmental and somatic dysfunction of lumbar region: Secondary | ICD-10-CM | POA: Diagnosis not present

## 2021-10-10 DIAGNOSIS — M9904 Segmental and somatic dysfunction of sacral region: Secondary | ICD-10-CM | POA: Diagnosis not present

## 2021-10-10 DIAGNOSIS — M5136 Other intervertebral disc degeneration, lumbar region: Secondary | ICD-10-CM | POA: Diagnosis not present

## 2021-10-10 DIAGNOSIS — M7661 Achilles tendinitis, right leg: Secondary | ICD-10-CM

## 2021-10-10 DIAGNOSIS — M47814 Spondylosis without myelopathy or radiculopathy, thoracic region: Secondary | ICD-10-CM | POA: Diagnosis not present

## 2021-10-10 NOTE — Telephone Encounter (Signed)
Right knee gel

## 2021-10-10 NOTE — Telephone Encounter (Signed)
VOB submitted for Monovisc, right knee. 

## 2021-10-13 DIAGNOSIS — J3089 Other allergic rhinitis: Secondary | ICD-10-CM | POA: Diagnosis not present

## 2021-10-13 DIAGNOSIS — J301 Allergic rhinitis due to pollen: Secondary | ICD-10-CM | POA: Diagnosis not present

## 2021-10-14 DIAGNOSIS — M9903 Segmental and somatic dysfunction of lumbar region: Secondary | ICD-10-CM | POA: Diagnosis not present

## 2021-10-14 DIAGNOSIS — M9904 Segmental and somatic dysfunction of sacral region: Secondary | ICD-10-CM | POA: Diagnosis not present

## 2021-10-14 DIAGNOSIS — M47814 Spondylosis without myelopathy or radiculopathy, thoracic region: Secondary | ICD-10-CM | POA: Diagnosis not present

## 2021-10-14 DIAGNOSIS — M5136 Other intervertebral disc degeneration, lumbar region: Secondary | ICD-10-CM | POA: Diagnosis not present

## 2021-10-14 DIAGNOSIS — M9902 Segmental and somatic dysfunction of thoracic region: Secondary | ICD-10-CM | POA: Diagnosis not present

## 2021-10-14 DIAGNOSIS — M5137 Other intervertebral disc degeneration, lumbosacral region: Secondary | ICD-10-CM | POA: Diagnosis not present

## 2021-10-16 DIAGNOSIS — M5137 Other intervertebral disc degeneration, lumbosacral region: Secondary | ICD-10-CM | POA: Diagnosis not present

## 2021-10-16 DIAGNOSIS — M9904 Segmental and somatic dysfunction of sacral region: Secondary | ICD-10-CM | POA: Diagnosis not present

## 2021-10-16 DIAGNOSIS — M9903 Segmental and somatic dysfunction of lumbar region: Secondary | ICD-10-CM | POA: Diagnosis not present

## 2021-10-16 DIAGNOSIS — M5136 Other intervertebral disc degeneration, lumbar region: Secondary | ICD-10-CM | POA: Diagnosis not present

## 2021-10-16 DIAGNOSIS — M47814 Spondylosis without myelopathy or radiculopathy, thoracic region: Secondary | ICD-10-CM | POA: Diagnosis not present

## 2021-10-16 DIAGNOSIS — M9902 Segmental and somatic dysfunction of thoracic region: Secondary | ICD-10-CM | POA: Diagnosis not present

## 2021-10-20 DIAGNOSIS — J301 Allergic rhinitis due to pollen: Secondary | ICD-10-CM | POA: Diagnosis not present

## 2021-10-20 DIAGNOSIS — J3089 Other allergic rhinitis: Secondary | ICD-10-CM | POA: Diagnosis not present

## 2021-10-20 DIAGNOSIS — J3081 Allergic rhinitis due to animal (cat) (dog) hair and dander: Secondary | ICD-10-CM | POA: Diagnosis not present

## 2021-10-21 DIAGNOSIS — M9904 Segmental and somatic dysfunction of sacral region: Secondary | ICD-10-CM | POA: Diagnosis not present

## 2021-10-21 DIAGNOSIS — M47814 Spondylosis without myelopathy or radiculopathy, thoracic region: Secondary | ICD-10-CM | POA: Diagnosis not present

## 2021-10-21 DIAGNOSIS — M5137 Other intervertebral disc degeneration, lumbosacral region: Secondary | ICD-10-CM | POA: Diagnosis not present

## 2021-10-21 DIAGNOSIS — M5136 Other intervertebral disc degeneration, lumbar region: Secondary | ICD-10-CM | POA: Diagnosis not present

## 2021-10-21 DIAGNOSIS — M9902 Segmental and somatic dysfunction of thoracic region: Secondary | ICD-10-CM | POA: Diagnosis not present

## 2021-10-21 DIAGNOSIS — M9903 Segmental and somatic dysfunction of lumbar region: Secondary | ICD-10-CM | POA: Diagnosis not present

## 2021-10-22 ENCOUNTER — Ambulatory Visit: Payer: Medicare PPO | Admitting: Rehabilitative and Restorative Service Providers"

## 2021-10-22 ENCOUNTER — Encounter: Payer: Self-pay | Admitting: Rehabilitative and Restorative Service Providers"

## 2021-10-22 DIAGNOSIS — M25571 Pain in right ankle and joints of right foot: Secondary | ICD-10-CM | POA: Diagnosis not present

## 2021-10-22 DIAGNOSIS — R262 Difficulty in walking, not elsewhere classified: Secondary | ICD-10-CM

## 2021-10-22 DIAGNOSIS — M25512 Pain in left shoulder: Secondary | ICD-10-CM | POA: Diagnosis not present

## 2021-10-22 DIAGNOSIS — G8929 Other chronic pain: Secondary | ICD-10-CM

## 2021-10-22 DIAGNOSIS — M25572 Pain in left ankle and joints of left foot: Secondary | ICD-10-CM | POA: Diagnosis not present

## 2021-10-22 DIAGNOSIS — M25511 Pain in right shoulder: Secondary | ICD-10-CM

## 2021-10-22 NOTE — Therapy (Signed)
OUTPATIENT PHYSICAL THERAPY LOWER EXTREMITY & SHOULDER EVALUATION   Patient Name: Spencer Ross MRN: [id number] DOB:[birth year], 71 y.o., male Today's Date: 10/22/2021  Referring diagnosis? M76.62 (ICD-10-CM) - Achilles tendinitis, left leg M76.61 (ICD-10-CM) - Achilles tendinitis, right leg M25.511,G89.29,M25.512 (ICD-10-CM) - Chronic pain of both shoulders  Treatment diagnosis? (if different than referring diagnosis) R26.2  M25.512  G89.29  M25.511  M25.572  M25.571 What was this (referring dx) caused by? '[]'$  Surgery '[]'$  Fall '[x]'$  Ongoing issue '[]'$  Arthritis '[]'$  Other: ____________  Laterality: '[]'$  Rt '[]'$  Lt '[x]'$  Both  Check all possible CPT codes:  *CHOOSE 10 OR LESS*    '[]'$  97110 (Therapeutic Exercise)  '[]'$  92507 (SLP Treatment)  '[]'$  97112 (Neuro Re-ed)   '[]'$  92526 (Swallowing Treatment)   '[]'$  97116 (Gait Training)   '[]'$  [id number] (Cognitive Training, 1st 15 minutes) '[]'$  97140 (Manual Therapy)   '[]'$  97130 (Cognitive Training, each add'l 15 minutes)  '[]'$  97164 (Re-evaluation)                              '[]'$  Other, List CPT Code ____________  '[]'$  63875 (Therapeutic Activities)     '[]'$  97535 (Self Care)   '[x]'$  All codes above (97110 - 97535)  '[]'$  97012 (Mechanical Traction)  '[]'$  97014 (E-stim Unattended)  '[]'$  97032 (E-stim manual)  '[]'$  97033 (Ionto)  '[]'$  97035 (Ultrasound) '[]'$  97750 (Physical Performance Training) '[]'$  [id number] (Aquatic Therapy) '[]'$  97016 (Vasopneumatic Device) '[]'$  [id number] (Paraffin) '[]'$  97034 (Contrast Bath) '[]'$  97597 (Wound Care 1st 20 sq cm) '[]'$  97598 (Wound Care each add'l 20 sq cm) '[]'$  97760 (Orthotic Fabrication, Fitting, Training Initial) '[]'$  [id number] (Prosthetic Management and Training Initial) '[]'$  [id number] (Orthotic or Prosthetic Training/ Modification Subsequent)    PT End of Session - 10/22/21 1653     Visit Number 1    Number of Visits 8    Authorization Type [employer]    Authorization - Number of Visits 10    PT Start Time 1515    PT Stop Time 1601    PT Time Calculation (min) 46 min     Activity Tolerance Patient tolerated treatment well;No increased pain    Behavior During Therapy [facility] for tasks assessed/performed             Past Medical History:  Diagnosis Date   Hypercholesteremia    Pulmonary embolism ([place])    Past Surgical History:  Procedure Laterality Date   KNEE SURGERY     right   Patient Active Problem List   Diagnosis Date Noted   Unilateral primary osteoarthritis, right knee 07/08/2021   Allergic rhinitis due to animal (cat) (dog) hair and dander 01/02/2021   Allergic rhinitis due to pollen 01/02/2021   Chronic allergic conjunctivitis 01/02/2021   Wheezing 01/02/2021   Bilateral high frequency sensorineural hearing loss 12/01/2018   Subjective tinnitus of left ear 12/01/2018   Allergic rhinitis 11/10/2013   GERD (gastroesophageal reflux disease) 11/10/2013   Antiphospholipid syndrome ([place]) 10/07/2013   Pulmonary emboli ([place]) 03/30/2013   Pleuritic chest pain 03/30/2013   Cough, persistent 03/30/2013   FHx: factor V Leiden mutation 03/30/2013    PCP: Dibas Koirala, MD  REFERRING PROVIDER: Jean Rosenthal, MD  REFERRING DIAG: [id number] (ICD-10-CM) - Achilles tendinitis, left leg M76.61 (ICD-10-CM) - Achilles tendinitis, right leg M25.511,G89.29,M25.512 (ICD-10-CM) - Chronic pain of both shoulders   THERAPY DIAG:  Difficulty in walking, not elsewhere classified  Chronic left shoulder pain  Chronic right shoulder pain  Pain in left ankle and joints of left foot  Pain in right ankle and joints of right foot  Rationale for Evaluation and Treatment Rehabilitation  ONSET DATE: Shoulders 10-30 years ago (Rt more chronic), Achilles 6-7 years ago  SUBJECTIVE:   SUBJECTIVE STATEMENT: Spencer Ross notes some shoulder stiffness in the mornings and some soreness when reading.  Achilles bothers him more at rest.  PERTINENT HISTORY: Previous R knee surgery, R knee OA, pulmonary emboli 2015  PAIN:  Are you having pain? Yes: NPRS scale:  0-3/10 Achilles and 2-3/10 shoulders on a 10/10 Pain location: R > L Achilles and shoulder Pain description: Sore, stiff, ache Aggravating factors: See above Relieving factors: Nothing, that is why he is here  PRECAUTIONS: None  WEIGHT BEARING RESTRICTIONS No  FALLS:  Has patient fallen in last 6 months? No  LIVING ENVIRONMENT: Lives with: lives with their spouse Lives in: House/apartment Stairs:  No problem Has following equipment at home: None  OCCUPATION: 80% retired, does all house chores  PLOF: Independent  PATIENT GOALS Less pain, easier time with ladders, reading, overhead function and reading   OBJECTIVE:   DIAGNOSTIC FINDINGS: 2 views of the right knee show tricompartment arthritis.  There is valgus  malalignment.  There is narrowing of the medial joint space and the  patellofemoral joint.  There are osteophytes in all 3 compartments   PATIENT SURVEYS:  FOTO Next visit  COGNITION:  Overall cognitive status: Within functional limits for tasks assessed     SENSATION: No complaints of tingling, numbness or paresthesias  POSTURE: rounded shoulders and forward head   UPPER/LOWER EXTREMITY ROM:  Passive ROM Right eval Left eval  Shoulder flexion 170 160  Hip extension    Hip abduction    Shoulder horizontal  adduction 30 40  Shoulder internal rotation 45 75  Shoulder external rotation 90 80  Knee flexion    Knee extension    Ankle dorsiflexion 10 15  Ankle plantarflexion    Ankle inversion    Ankle eversion     (Blank rows = not tested)  UPPER/LOWER EXTREMITY STRENGTH:  ASSESSED WITH HAND-HELD DYNAMOMETER Right eval Left eval  Hip flexion    Hip extension    Hip abduction    Hip adduction    Shoulder internal rotation 35.9 pounds 34.9 pounds  Shoulder external rotation 26.8 pounds 27.5 pounds  Knee flexion    Knee extension    Ankle dorsiflexion    Ankle plantarflexion    Ankle inversion 41.8 pounds 37.9 pounds  Ankle eversion 41.3  pounds 35.4 pounds   (Blank rows = not tested)  GAIT: Distance walked: 200 feet Assistive device utilized: None Level of assistance: Complete Independence Comments: No deviations noted    TODAY'S TREATMENT: Eval only (2 joint eval)   PATIENT EDUCATION:  Education details: Reviewed exam findings Person educated: Patient Education method: Explanation, Demonstration, and Verbal cues Education comprehension: verbalized understanding, verbal cues required, and needs further education   HOME EXERCISE PROGRAM: To be added next visit  ASSESSMENT:  CLINICAL IMPRESSION: Patient is a 72 y.o. male who was seen today for physical therapy evaluation and treatment for Bil Achilles tendonitis and Bil shoulder pain.  Objective measures are not bad, although some posterior capsule tightness of the R shoulder is noted and Bil shoulders and calves will benefit from being stronger.  Spencer Ross will likely do well with a basic HEP to address these issues and I recommended he attend a PT reassessment in 4 weeks to assess  gains and determine if he needs to follow-up again with Dr. Ninfa Linden.   OBJECTIVE IMPAIRMENTS decreased activity tolerance, decreased endurance, decreased knowledge of condition, difficulty walking, decreased ROM, decreased strength, impaired perceived functional ability, impaired flexibility, impaired UE functional use, postural dysfunction, and pain.   ACTIVITY LIMITATIONS carrying, lifting, standing, squatting, stairs, and reach over head  PARTICIPATION LIMITATIONS: cleaning, community activity, and yard work  PERSONAL FACTORS Time since onset of injury/illness/exacerbation are also affecting patient's functional outcome.   REHAB POTENTIAL: Good  CLINICAL DECISION MAKING: Evolving/moderate complexity  EVALUATION COMPLEXITY: Moderate   GOALS: Goals reviewed with patient? Yes   LONG TERM GOALS: Target date: 11/19/2021   Improve Bil shoulder AROM for flexion to 170/170; ER to  90/90; IR to 60/60 and horizontal adduction to 40/40 Baseline: Impairments Bil (see objective) Goal status: INITIAL  2.  Improve Bil ankle and shoulder strength as assessed by functional scores (FOTO) and objective strength gains Baseline: See objective, FOTO visit 2 Goal status: INITIAL  3.  Spencer Ross will be independent with his long-term HEP at DC Baseline: Start visit 2 Goal status: INITIAL  4.  Spencer Ross will report B Achilles and shoulder pain as consistently < 2/10 on the VAS Baseline: 2-3/10 Goal status: INITIAL  5.  Improve FOTO to expected value Baseline: TBD visit 2 Goal status: INITIAL   PLAN: PT FREQUENCY: 1-2x/week  PT DURATION: 4 weeks  PLANNED INTERVENTIONS: Therapeutic exercises, Therapeutic activity, Neuromuscular re-education, Balance training, Gait training, Patient/Family education, Self Care, Stair training, Cryotherapy, and Manual therapy  PLAN FOR NEXT SESSION: Shoulder ER theraband, scapular retractor strengthening (Rows, shoulder blade pinches), serratus anterior strengthening, Rt IR/posterior capsule stretching, heel raises with slow eccentrics and heel cords box (upper/lower)   Farley Ly, PT, MPT 10/22/2021, 5:08 PM

## 2021-10-23 DIAGNOSIS — M47814 Spondylosis without myelopathy or radiculopathy, thoracic region: Secondary | ICD-10-CM | POA: Diagnosis not present

## 2021-10-23 DIAGNOSIS — M9904 Segmental and somatic dysfunction of sacral region: Secondary | ICD-10-CM | POA: Diagnosis not present

## 2021-10-23 DIAGNOSIS — M5137 Other intervertebral disc degeneration, lumbosacral region: Secondary | ICD-10-CM | POA: Diagnosis not present

## 2021-10-23 DIAGNOSIS — M9903 Segmental and somatic dysfunction of lumbar region: Secondary | ICD-10-CM | POA: Diagnosis not present

## 2021-10-23 DIAGNOSIS — M5136 Other intervertebral disc degeneration, lumbar region: Secondary | ICD-10-CM | POA: Diagnosis not present

## 2021-10-23 DIAGNOSIS — M9902 Segmental and somatic dysfunction of thoracic region: Secondary | ICD-10-CM | POA: Diagnosis not present

## 2021-10-27 DIAGNOSIS — J301 Allergic rhinitis due to pollen: Secondary | ICD-10-CM | POA: Diagnosis not present

## 2021-10-27 DIAGNOSIS — J3089 Other allergic rhinitis: Secondary | ICD-10-CM | POA: Diagnosis not present

## 2021-10-28 DIAGNOSIS — M9902 Segmental and somatic dysfunction of thoracic region: Secondary | ICD-10-CM | POA: Diagnosis not present

## 2021-10-28 DIAGNOSIS — M9903 Segmental and somatic dysfunction of lumbar region: Secondary | ICD-10-CM | POA: Diagnosis not present

## 2021-10-28 DIAGNOSIS — M5137 Other intervertebral disc degeneration, lumbosacral region: Secondary | ICD-10-CM | POA: Diagnosis not present

## 2021-10-28 DIAGNOSIS — M47814 Spondylosis without myelopathy or radiculopathy, thoracic region: Secondary | ICD-10-CM | POA: Diagnosis not present

## 2021-10-28 DIAGNOSIS — M5136 Other intervertebral disc degeneration, lumbar region: Secondary | ICD-10-CM | POA: Diagnosis not present

## 2021-10-28 DIAGNOSIS — M9904 Segmental and somatic dysfunction of sacral region: Secondary | ICD-10-CM | POA: Diagnosis not present

## 2021-10-30 DIAGNOSIS — M5137 Other intervertebral disc degeneration, lumbosacral region: Secondary | ICD-10-CM | POA: Diagnosis not present

## 2021-10-30 DIAGNOSIS — M9902 Segmental and somatic dysfunction of thoracic region: Secondary | ICD-10-CM | POA: Diagnosis not present

## 2021-10-30 DIAGNOSIS — M47814 Spondylosis without myelopathy or radiculopathy, thoracic region: Secondary | ICD-10-CM | POA: Diagnosis not present

## 2021-10-30 DIAGNOSIS — M9904 Segmental and somatic dysfunction of sacral region: Secondary | ICD-10-CM | POA: Diagnosis not present

## 2021-10-30 DIAGNOSIS — M5136 Other intervertebral disc degeneration, lumbar region: Secondary | ICD-10-CM | POA: Diagnosis not present

## 2021-10-30 DIAGNOSIS — M9903 Segmental and somatic dysfunction of lumbar region: Secondary | ICD-10-CM | POA: Diagnosis not present

## 2021-10-31 ENCOUNTER — Encounter: Payer: Self-pay | Admitting: Rehabilitative and Restorative Service Providers"

## 2021-10-31 ENCOUNTER — Ambulatory Visit: Payer: Medicare PPO | Admitting: Rehabilitative and Restorative Service Providers"

## 2021-10-31 DIAGNOSIS — M25572 Pain in left ankle and joints of left foot: Secondary | ICD-10-CM | POA: Diagnosis not present

## 2021-10-31 DIAGNOSIS — M25511 Pain in right shoulder: Secondary | ICD-10-CM

## 2021-10-31 DIAGNOSIS — G8929 Other chronic pain: Secondary | ICD-10-CM

## 2021-10-31 DIAGNOSIS — R262 Difficulty in walking, not elsewhere classified: Secondary | ICD-10-CM

## 2021-10-31 DIAGNOSIS — Z23 Encounter for immunization: Secondary | ICD-10-CM | POA: Diagnosis not present

## 2021-10-31 DIAGNOSIS — M25571 Pain in right ankle and joints of right foot: Secondary | ICD-10-CM | POA: Diagnosis not present

## 2021-10-31 DIAGNOSIS — M25512 Pain in left shoulder: Secondary | ICD-10-CM

## 2021-10-31 NOTE — Therapy (Signed)
OUTPATIENT PHYSICAL THERAPY TREATMENT NOTE   Patient Name: Spencer Ross MRN: [id number] DOB:July 22, 1950, 71 y.o., male Today's Date: 10/31/2021  END OF SESSION:   PT End of Session - 10/31/21 1552     Visit Number 2    Number of Visits [place] - Number of Visits 10    PT Start Time 1302    PT Stop Time 1345    PT Time Calculation (min) 43 min    Activity Tolerance Patient tolerated treatment well;No increased pain    Behavior During Therapy WFL for tasks assessed/performed             Past Medical History:  Diagnosis Date   Hypercholesteremia    Pulmonary embolism ([place])    Past Surgical History:  Procedure Laterality Date   KNEE SURGERY     right   Patient Active Problem List   Diagnosis Date Noted   Unilateral primary osteoarthritis, right knee 07/08/2021   Allergic rhinitis due to animal (cat) (dog) hair and dander 01/02/2021   Allergic rhinitis due to pollen 01/02/2021   Chronic allergic conjunctivitis 01/02/2021   Wheezing 01/02/2021   Bilateral high frequency sensorineural hearing loss 12/01/2018   Subjective tinnitus of left ear 12/01/2018   Allergic rhinitis 11/10/2013   GERD (gastroesophageal reflux disease) 11/10/2013   Antiphospholipid syndrome ([place]) 10/07/2013   Pulmonary emboli ([place]) 03/30/2013   Pleuritic chest pain 03/30/2013   Cough, persistent 03/30/2013   FHx: factor V Leiden mutation 03/30/2013     THERAPY DIAG:  Difficulty in walking, not elsewhere classified  Chronic left shoulder pain  Chronic right shoulder pain  Pain in left ankle and joints of left foot  Pain in right ankle and joints of right foot  PCP: Dibas Koirala, MD   REFERRING PROVIDER: Jean Rosenthal, MD   REFERRING DIAG: [phone] (ICD-10-CM) - Achilles tendinitis, left leg M76.61 (ICD-10-CM) - Achilles tendinitis, right leg M25.511,G89.29,M25.512 (ICD-10-CM) - Chronic pain of both shoulders    THERAPY DIAG:   Difficulty in walking, not elsewhere classified   Chronic left shoulder pain   Chronic right shoulder pain   Pain in left ankle and joints of left foot   Pain in right ankle and joints of right foot   Rationale for Evaluation and Treatment Rehabilitation   ONSET DATE: Shoulders 10-30 years ago (Rt more chronic), Achilles 6-7 years ago   SUBJECTIVE:    SUBJECTIVE STATEMENT: [name] notes some shoulder stiffness in the mornings and some soreness when reading.  Achilles bothers him more at rest.  He has been doing yard work and his normal activities since evaluation.   PERTINENT HISTORY: Previous R knee surgery, R knee OA, pulmonary emboli 2015   PAIN:  Are you having pain? Yes: NPRS scale: 0-3/10 Achilles and 2-3/10 shoulders on a 10/10 Pain location: R > L Achilles and shoulder Pain description: Sore, stiff, ache Aggravating factors: See above Relieving factors: Nothing, that is why he is here   PRECAUTIONS: None   WEIGHT BEARING RESTRICTIONS No   FALLS:  Has patient fallen in last 6 months? No   LIVING ENVIRONMENT: Lives with: lives with their spouse Lives in: House/apartment Stairs:  No problem Has following equipment at home: None   OCCUPATION: 80% retired, does all house chores   PLOF: Independent   PATIENT GOALS Less pain, easier time with ladders, reading, overhead function and reading     OBJECTIVE:    DIAGNOSTIC FINDINGS: 2 views of  the right knee show tricompartment arthritis.  There is valgus  malalignment.  There is narrowing of the medial joint space and the  patellofemoral joint.  There are osteophytes in all 3 compartments    PATIENT SURVEYS:  FOTO Next visit   COGNITION:           Overall cognitive status: Within functional limits for tasks assessed                          SENSATION: No complaints of tingling, numbness or paresthesias   POSTURE: rounded shoulders and forward head     UPPER/LOWER EXTREMITY ROM:   Passive ROM  Right 10/22/2021 Left 10/22/2021  Shoulder flexion 170 160  Hip extension      Hip abduction      Shoulder horizontal  adduction 30 40  Shoulder internal rotation 45 75  Shoulder external rotation 90 80  Knee flexion      Knee extension      Ankle dorsiflexion 10 15  Ankle plantarflexion      Ankle inversion      Ankle eversion       (Blank rows = not tested)   UPPER/LOWER EXTREMITY STRENGTH:   ASSESSED WITH HAND-HELD DYNAMOMETER Right 10/22/2021 Left 10/22/2021  Hip flexion      Hip extension      Hip abduction      Hip adduction      Shoulder internal rotation 35.9 pounds 34.9 pounds  Shoulder external rotation 26.8 pounds 27.5 pounds  Knee flexion      Knee extension      Ankle dorsiflexion      Ankle plantarflexion      Ankle inversion 41.8 pounds 37.9 pounds  Ankle eversion 41.3 pounds 35.4 pounds   (Blank rows = not tested)   GAIT: Distance walked: 200 feet Assistive device utilized: None Level of assistance: Complete Independence Comments: No deviations noted       TODAY'S TREATMENT: 10/31/2021 Shoulder blade pinches 10X 5 seconds Heel raises with slow eccentrics (Up both down L only OR up both and shift as much to the R as possible) 10X each Pull to chest/Rows Blue theraband 20X 3 seconds Shoulder ER theraband 2 sets of 10 Green Supine arm raises 20X 3 seconds with 5# Thumb up the back stretch 5X 10 seconds Bil Posterior capsule stretch 5X 10 seconds Bil   10/22/2021 Eval only (2 joint eval)     PATIENT EDUCATION:  Education details: Reviewed exam findings Person educated: Patient Education method: Explanation, Demonstration, and Verbal cues Education comprehension: verbalized understanding, verbal cues required, and needs further education      HOME EXERCISE PROGRAM: Access Code: [email or web address] URL: [email or web address] Date: 10/31/2021 Prepared by: Vista Mink  Exercises - Standing Scapular Retraction  - 5 x daily - 7 x weekly -  1 sets - 5 reps - 5 second hold - Standing Row with Anchored Resistance Band with PLB  - 1 x daily - 5-7 x weekly - 1 sets - 20 reps - 3 seconds hold - Shoulder External Rotation with Anchored Resistance  - 1 x daily - 5-7 x weekly - 1-2 sets - 10 reps - 3 hold - Supine Scapular Protraction in Flexion with Dumbbells  - 1 x daily - 5-7 x weekly - 1 sets - 20 reps - 3 seconds hold - Standing Shoulder Posterior Capsule Stretch  - 1 x daily - 7 x weekly - 1 sets - 5  reps - 10 hold - Standing Shoulder Internal Rotation Stretch with Hands Behind Back  - 1 x daily - 7 x weekly - 1 sets - 5 reps - 10 seconds hold - Heel Raise  - 3-5 x daily - 7 x weekly - 1 sets - 5-10 reps - 3 seconds hold   ASSESSMENT:   CLINICAL IMPRESSION: [name] did a good job with his HEP assigned today.  Because day 1 was eval only (long evaluation) we will reassess next visit with any needed progressions before he leaves town for a week.  Likely progress note and FOTO upon his return to [place].     OBJECTIVE IMPAIRMENTS decreased activity tolerance, decreased endurance, decreased knowledge of condition, difficulty walking, decreased ROM, decreased strength, impaired perceived functional ability, impaired flexibility, impaired UE functional use, postural dysfunction, and pain.    ACTIVITY LIMITATIONS carrying, lifting, standing, squatting, stairs, and reach over head   PARTICIPATION LIMITATIONS: cleaning, community activity, and yard work   PERSONAL FACTORS Time since onset of injury/illness/exacerbation are also affecting patient's functional outcome.    REHAB POTENTIAL: Good   CLINICAL DECISION MAKING: Evolving/moderate complexity   EVALUATION COMPLEXITY: Moderate     GOALS: Goals reviewed with patient? Yes     LONG TERM GOALS: Target date: 11/19/2021    Improve Bil shoulder AROM for flexion to 170/170; ER to 90/90; IR to 60/60 and horizontal adduction to 40/40 Baseline: Impairments Bil (see objective) Goal  status: INITIAL   2.  Improve Bil ankle and shoulder strength as assessed by functional scores (FOTO) and objective strength gains Baseline: See objective, FOTO visit 2 Goal status: INITIAL   3.  [name] will be independent with his long-term HEP at DC Baseline: Start visit 2 Goal status: On Going 10/31/2021   4.  [name] will report B Achilles and shoulder pain as consistently < 2/10 on the VAS Baseline: 2-3/10 Goal status: INITIAL   5.  Improve FOTO to expected value Baseline: TBD visit 2 Goal status: INITIAL     PLAN: PT FREQUENCY: 1-2x/week   PT DURATION: 4 weeks   PLANNED INTERVENTIONS: Therapeutic exercises, Therapeutic activity, Neuromuscular re-education, Balance training, Gait training, Patient/Family education, Self Care, Stair training, Cryotherapy, and Manual therapy   PLAN FOR NEXT SESSION: FOTO, progress posterior shoulder strength and ankle strength as needed, possible heel cords box.       Farley Ly, PT, MPT 10/31/2021, 4:03 PM

## 2021-11-03 ENCOUNTER — Other Ambulatory Visit: Payer: Self-pay

## 2021-11-03 DIAGNOSIS — M9902 Segmental and somatic dysfunction of thoracic region: Secondary | ICD-10-CM | POA: Diagnosis not present

## 2021-11-03 DIAGNOSIS — M5136 Other intervertebral disc degeneration, lumbar region: Secondary | ICD-10-CM | POA: Diagnosis not present

## 2021-11-03 DIAGNOSIS — M9904 Segmental and somatic dysfunction of sacral region: Secondary | ICD-10-CM | POA: Diagnosis not present

## 2021-11-03 DIAGNOSIS — J3081 Allergic rhinitis due to animal (cat) (dog) hair and dander: Secondary | ICD-10-CM | POA: Diagnosis not present

## 2021-11-03 DIAGNOSIS — J3089 Other allergic rhinitis: Secondary | ICD-10-CM | POA: Diagnosis not present

## 2021-11-03 DIAGNOSIS — M1711 Unilateral primary osteoarthritis, right knee: Secondary | ICD-10-CM

## 2021-11-03 DIAGNOSIS — M5137 Other intervertebral disc degeneration, lumbosacral region: Secondary | ICD-10-CM | POA: Diagnosis not present

## 2021-11-03 DIAGNOSIS — J301 Allergic rhinitis due to pollen: Secondary | ICD-10-CM | POA: Diagnosis not present

## 2021-11-03 DIAGNOSIS — M47814 Spondylosis without myelopathy or radiculopathy, thoracic region: Secondary | ICD-10-CM | POA: Diagnosis not present

## 2021-11-03 DIAGNOSIS — M9903 Segmental and somatic dysfunction of lumbar region: Secondary | ICD-10-CM | POA: Diagnosis not present

## 2021-11-04 ENCOUNTER — Ambulatory Visit: Payer: Medicare PPO | Admitting: Orthopaedic Surgery

## 2021-11-06 ENCOUNTER — Ambulatory Visit: Payer: Medicare PPO | Admitting: Rehabilitative and Restorative Service Providers"

## 2021-11-06 ENCOUNTER — Encounter: Payer: Self-pay | Admitting: Rehabilitative and Restorative Service Providers"

## 2021-11-06 DIAGNOSIS — M25571 Pain in right ankle and joints of right foot: Secondary | ICD-10-CM

## 2021-11-06 DIAGNOSIS — M25511 Pain in right shoulder: Secondary | ICD-10-CM

## 2021-11-06 DIAGNOSIS — R262 Difficulty in walking, not elsewhere classified: Secondary | ICD-10-CM

## 2021-11-06 DIAGNOSIS — M25512 Pain in left shoulder: Secondary | ICD-10-CM | POA: Diagnosis not present

## 2021-11-06 DIAGNOSIS — M25572 Pain in left ankle and joints of left foot: Secondary | ICD-10-CM | POA: Diagnosis not present

## 2021-11-06 DIAGNOSIS — G8929 Other chronic pain: Secondary | ICD-10-CM | POA: Diagnosis not present

## 2021-11-06 NOTE — Therapy (Addendum)
OUTPATIENT PHYSICAL THERAPY TREATMENT/DISCHARGE NOTE   Patient Name: Spencer Ross MRN: [id number] DOB:11/03/1950, 71 y.o., male Today's Date: 11/06/2021  PHYSICAL THERAPY DISCHARGE SUMMARY  Visits from Start of Care: 2  Current functional level related to goals / functional outcomes: See note   Remaining deficits: See note   Education / Equipment: Updated HEP  Patient agrees to discharge. Patient goals were partially met. Patient is being discharged due to not returning since the last visit.    END OF SESSION:   PT End of Session - 11/06/21 1527     Visit Number 3    Number of Visits [place] - Number of Visits 10    PT Start Time 2376    PT Stop Time 1525    PT Time Calculation (min) 41 min    Activity Tolerance Patient tolerated treatment well;No increased pain    Behavior During Therapy WFL for tasks assessed/performed              Past Medical History:  Diagnosis Date   Hypercholesteremia    Pulmonary embolism ([place])    Past Surgical History:  Procedure Laterality Date   KNEE SURGERY     right   Patient Active Problem List   Diagnosis Date Noted   Unilateral primary osteoarthritis, right knee 07/08/2021   Allergic rhinitis due to animal (cat) (dog) hair and dander 01/02/2021   Allergic rhinitis due to pollen 01/02/2021   Chronic allergic conjunctivitis 01/02/2021   Wheezing 01/02/2021   Bilateral high frequency sensorineural hearing loss 12/01/2018   Subjective tinnitus of left ear 12/01/2018   Allergic rhinitis 11/10/2013   GERD (gastroesophageal reflux disease) 11/10/2013   Antiphospholipid syndrome ([place]) 10/07/2013   Pulmonary emboli ([place]) 03/30/2013   Pleuritic chest pain 03/30/2013   Cough, persistent 03/30/2013   FHx: factor V Leiden mutation 03/30/2013     THERAPY DIAG:  Difficulty in walking, not elsewhere classified  Chronic left shoulder pain  Chronic right shoulder pain  Pain in  left ankle and joints of left foot  Pain in right ankle and joints of right foot  PCP: Dibas Koirala, MD   REFERRING PROVIDER: Jean Rosenthal, MD   REFERRING DIAG: [phone] (ICD-10-CM) - Achilles tendinitis, left leg M76.61 (ICD-10-CM) - Achilles tendinitis, right leg M25.511,G89.29,M25.512 (ICD-10-CM) - Chronic pain of both shoulders    THERAPY DIAG:  Difficulty in walking, not elsewhere classified   Chronic left shoulder pain   Chronic right shoulder pain   Pain in left ankle and joints of left foot   Pain in right ankle and joints of right foot   Rationale for Evaluation and Treatment Rehabilitation   ONSET DATE: Shoulders 10-30 years ago (Rt more chronic), Achilles 6-7 years ago   SUBJECTIVE:    SUBJECTIVE STATEMENT: [name] notes HEO compliance could improve as he has been very busy.  His shoulders are stiff in the mornings and some soreness is noted when reading.  His Achilles bothers him more at rest.     PERTINENT HISTORY: Previous R knee surgery, R knee OA, pulmonary emboli 2015   PAIN:  Are you having pain? Yes: NPRS scale: 0-3/10 Achilles and 2-3/10 shoulders on a 10/10 Pain location: R > L Achilles and shoulder Pain description: Sore, stiff, ache Aggravating factors: See above Relieving factors: Nothing, that is why he is here   PRECAUTIONS: None   WEIGHT BEARING RESTRICTIONS No   FALLS:  Has patient fallen in last 6  months? No   LIVING ENVIRONMENT: Lives with: lives with their spouse Lives in: House/apartment Stairs:  No problem Has following equipment at home: None   OCCUPATION: 80% retired, does all house chores   PLOF: Independent   PATIENT GOALS Less pain, easier time with ladders, reading, overhead function and reading     OBJECTIVE:    DIAGNOSTIC FINDINGS: 2 views of the right knee show tricompartment arthritis.  There is valgus  malalignment.  There is narrowing of the medial joint space and the  patellofemoral joint.  There are  osteophytes in all 3 compartments    PATIENT SURVEYS:  FOTO Next visit   COGNITION:           Overall cognitive status: Within functional limits for tasks assessed                          SENSATION: No complaints of tingling, numbness or paresthesias   POSTURE: rounded shoulders and forward head     UPPER/LOWER EXTREMITY ROM:   Passive ROM Right 10/22/2021 Left 10/22/2021  Shoulder flexion 170 160  Hip extension      Hip abduction      Shoulder horizontal  adduction 30 40  Shoulder internal rotation 45 75  Shoulder external rotation 90 80  Knee flexion      Knee extension      Ankle dorsiflexion 10 15  Ankle plantarflexion      Ankle inversion      Ankle eversion       (Blank rows = not tested)   UPPER/LOWER EXTREMITY STRENGTH:   ASSESSED WITH HAND-HELD DYNAMOMETER Right 10/22/2021 Left 10/22/2021  Hip flexion      Hip extension      Hip abduction      Hip adduction      Shoulder internal rotation 35.9 pounds 34.9 pounds  Shoulder external rotation 26.8 pounds 27.5 pounds  Knee flexion      Knee extension      Ankle dorsiflexion      Ankle plantarflexion      Ankle inversion 41.8 pounds 37.9 pounds  Ankle eversion 41.3 pounds 35.4 pounds   (Blank rows = not tested)   GAIT: Distance walked: 200 feet Assistive device utilized: None Level of assistance: Complete Independence Comments: No deviations noted       TODAY'S TREATMENT: 11/06/2021 Shoulder blade pinches 10X 5 seconds Heel raises with slow eccentrics (Up both down L or R only) 2 sets of 10 each Pull to chest/Rows Blue theraband 20X 3 seconds Shoulder ER theraband 2 sets of 10 Green Supine arm raises 20X 3 seconds with 5# Thumb up the back stretch 5X 10 seconds Bil Posterior capsule stretch 5X 10 seconds Bil   10/31/2021 Shoulder blade pinches 10X 5 seconds Heel raises with slow eccentrics (Up both down L only OR up both and shift as much to the R as possible) 10X each Pull to chest/Rows Blue  theraband 20X 3 seconds Shoulder ER theraband 2 sets of 10 Green Supine arm raises 20X 3 seconds with 5# Thumb up the back stretch 5X 10 seconds Bil Posterior capsule stretch 5X 10 seconds Bil   10/22/2021 Eval only (2 joint eval)     PATIENT EDUCATION:  Education details: Reviewed exam findings Person educated: Patient Education method: Explanation, Demonstration, and Verbal cues Education comprehension: verbalized understanding, verbal cues required, and needs further education      HOME EXERCISE PROGRAM: Access Code: [email or web address] URL:  [email or web address] Date: 10/31/2021 Prepared by: Vista Mink  Exercises - Standing Scapular Retraction  - 5 x daily - 7 x weekly - 1 sets - 5 reps - 5 second hold - Standing Row with Anchored Resistance Band with PLB  - 1 x daily - 5-7 x weekly - 1 sets - 20 reps - 3 seconds hold - Shoulder External Rotation with Anchored Resistance  - 1 x daily - 5-7 x weekly - 1-2 sets - 10 reps - 3 hold - Supine Scapular Protraction in Flexion with Dumbbells  - 1 x daily - 5-7 x weekly - 1 sets - 20 reps - 3 seconds hold - Standing Shoulder Posterior Capsule Stretch  - 1 x daily - 7 x weekly - 1 sets - 5 reps - 10 hold - Standing Shoulder Internal Rotation Stretch with Hands Behind Back  - 1 x daily - 7 x weekly - 1 sets - 5 reps - 10 seconds hold - Heel Raise  - 3-5 x daily - 7 x weekly - 1 sets - 5-10 reps - 3 seconds hold   ASSESSMENT:   CLINICAL IMPRESSION: [name] needed feedback and correction with his HEP.  Compliance was also a struggle due to his schedule.  The focus today was on reviewing his program and correcting technique.  He will be out of town for a conference and we will consider a progress note when he returns if compliance has been acceptable.  Likely progress note and FOTO upon his return to [place].     OBJECTIVE IMPAIRMENTS decreased activity tolerance, decreased endurance, decreased knowledge of condition, difficulty  walking, decreased ROM, decreased strength, impaired perceived functional ability, impaired flexibility, impaired UE functional use, postural dysfunction, and pain.    ACTIVITY LIMITATIONS carrying, lifting, standing, squatting, stairs, and reach over head   PARTICIPATION LIMITATIONS: cleaning, community activity, and yard work   PERSONAL FACTORS Time since onset of injury/illness/exacerbation are also affecting patient's functional outcome.    REHAB POTENTIAL: Good   CLINICAL DECISION MAKING: Evolving/moderate complexity   EVALUATION COMPLEXITY: Moderate     GOALS: Goals reviewed with patient? Yes     LONG TERM GOALS: Target date: 11/19/2021    Improve Bil shoulder AROM for flexion to 170/170; ER to 90/90; IR to 60/60 and horizontal adduction to 40/40 Baseline: Impairments Bil (see objective) Goal status: INITIAL   2.  Improve Bil ankle and shoulder strength as assessed by functional scores (FOTO) and objective strength gains Baseline: See objective, FOTO visit 2 Goal status: INITIAL   3.  [name] will be independent with his long-term HEP at DC Baseline: Start visit 2 Goal status: On Going 10/31/2021   4.  [name] will report B Achilles and shoulder pain as consistently < 2/10 on the VAS Baseline: 2-3/10 Goal status: INITIAL   5.  Improve FOTO to expected value Baseline: TBD visit 2 Goal status: INITIAL     PLAN: PT FREQUENCY: 1-2x/week   PT DURATION: 4 weeks   PLANNED INTERVENTIONS: Therapeutic exercises, Therapeutic activity, Neuromuscular re-education, Balance training, Gait training, Patient/Family education, Self Care, Stair training, Cryotherapy, and Manual therapy   PLAN FOR NEXT SESSION: FOTO, progress posterior shoulder strength and ankle strength as needed, possible heel cords box.  Update 01/19/2022, [name] has not returned to PT so we will close out his chart and DC.  He is welcome to return if requested.       Farley Ly, PT, MPT 11/06/2021, 3:32 PM

## 2021-11-07 DIAGNOSIS — J3089 Other allergic rhinitis: Secondary | ICD-10-CM | POA: Diagnosis not present

## 2021-11-07 DIAGNOSIS — M5137 Other intervertebral disc degeneration, lumbosacral region: Secondary | ICD-10-CM | POA: Diagnosis not present

## 2021-11-07 DIAGNOSIS — M9903 Segmental and somatic dysfunction of lumbar region: Secondary | ICD-10-CM | POA: Diagnosis not present

## 2021-11-07 DIAGNOSIS — J3081 Allergic rhinitis due to animal (cat) (dog) hair and dander: Secondary | ICD-10-CM | POA: Diagnosis not present

## 2021-11-07 DIAGNOSIS — M5136 Other intervertebral disc degeneration, lumbar region: Secondary | ICD-10-CM | POA: Diagnosis not present

## 2021-11-07 DIAGNOSIS — M47814 Spondylosis without myelopathy or radiculopathy, thoracic region: Secondary | ICD-10-CM | POA: Diagnosis not present

## 2021-11-07 DIAGNOSIS — M9902 Segmental and somatic dysfunction of thoracic region: Secondary | ICD-10-CM | POA: Diagnosis not present

## 2021-11-07 DIAGNOSIS — M9904 Segmental and somatic dysfunction of sacral region: Secondary | ICD-10-CM | POA: Diagnosis not present

## 2021-11-07 DIAGNOSIS — J301 Allergic rhinitis due to pollen: Secondary | ICD-10-CM | POA: Diagnosis not present

## 2021-11-15 DIAGNOSIS — U071 COVID-19: Secondary | ICD-10-CM | POA: Diagnosis not present

## 2021-11-15 DIAGNOSIS — J309 Allergic rhinitis, unspecified: Secondary | ICD-10-CM | POA: Diagnosis not present

## 2021-11-19 ENCOUNTER — Encounter: Payer: Medicare PPO | Admitting: Rehabilitative and Restorative Service Providers"

## 2021-11-21 DIAGNOSIS — U071 COVID-19: Secondary | ICD-10-CM | POA: Diagnosis not present

## 2021-11-26 DIAGNOSIS — F411 Generalized anxiety disorder: Secondary | ICD-10-CM | POA: Diagnosis not present

## 2021-11-26 DIAGNOSIS — U071 COVID-19: Secondary | ICD-10-CM | POA: Diagnosis not present

## 2021-11-26 DIAGNOSIS — R5383 Other fatigue: Secondary | ICD-10-CM | POA: Diagnosis not present

## 2021-11-26 DIAGNOSIS — J3089 Other allergic rhinitis: Secondary | ICD-10-CM | POA: Diagnosis not present

## 2021-11-26 DIAGNOSIS — J3081 Allergic rhinitis due to animal (cat) (dog) hair and dander: Secondary | ICD-10-CM | POA: Diagnosis not present

## 2021-11-26 DIAGNOSIS — J301 Allergic rhinitis due to pollen: Secondary | ICD-10-CM | POA: Diagnosis not present

## 2021-11-27 ENCOUNTER — Encounter: Payer: Medicare PPO | Admitting: Rehabilitative and Restorative Service Providers"

## 2021-12-05 ENCOUNTER — Emergency Department (HOSPITAL_COMMUNITY): Payer: Medicare PPO

## 2021-12-05 ENCOUNTER — Inpatient Hospital Stay (HOSPITAL_COMMUNITY)
Admission: EM | Admit: 2021-12-05 | Discharge: 2021-12-25 | DRG: 056 | Disposition: A | Discharge status: Hospice | Payer: Medicare PPO | Attending: Internal Medicine | Admitting: Internal Medicine

## 2021-12-05 ENCOUNTER — Other Ambulatory Visit: Payer: Self-pay

## 2021-12-05 ENCOUNTER — Encounter (HOSPITAL_COMMUNITY): Payer: Self-pay

## 2021-12-05 DIAGNOSIS — E870 Hyperosmolality and hypernatremia: Secondary | ICD-10-CM | POA: Diagnosis present

## 2021-12-05 DIAGNOSIS — G0490 Encephalitis and encephalomyelitis, unspecified: Secondary | ICD-10-CM | POA: Diagnosis not present

## 2021-12-05 DIAGNOSIS — E7800 Pure hypercholesterolemia, unspecified: Secondary | ICD-10-CM | POA: Diagnosis present

## 2021-12-05 DIAGNOSIS — Z885 Allergy status to narcotic agent status: Secondary | ICD-10-CM

## 2021-12-05 DIAGNOSIS — C61 Malignant neoplasm of prostate: Secondary | ICD-10-CM | POA: Diagnosis not present

## 2021-12-05 DIAGNOSIS — R410 Disorientation, unspecified: Secondary | ICD-10-CM | POA: Diagnosis not present

## 2021-12-05 DIAGNOSIS — Z8249 Family history of ischemic heart disease and other diseases of the circulatory system: Secondary | ICD-10-CM

## 2021-12-05 DIAGNOSIS — F419 Anxiety disorder, unspecified: Secondary | ICD-10-CM | POA: Diagnosis present

## 2021-12-05 DIAGNOSIS — I959 Hypotension, unspecified: Secondary | ICD-10-CM | POA: Diagnosis not present

## 2021-12-05 DIAGNOSIS — J988 Other specified respiratory disorders: Secondary | ICD-10-CM | POA: Diagnosis not present

## 2021-12-05 DIAGNOSIS — A8100 Creutzfeldt-Jakob disease, unspecified: Principal | ICD-10-CM | POA: Diagnosis present

## 2021-12-05 DIAGNOSIS — Z6823 Body mass index (BMI) 23.0-23.9, adult: Secondary | ICD-10-CM

## 2021-12-05 DIAGNOSIS — J9811 Atelectasis: Secondary | ICD-10-CM | POA: Diagnosis not present

## 2021-12-05 DIAGNOSIS — Z043 Encounter for examination and observation following other accident: Secondary | ICD-10-CM | POA: Diagnosis not present

## 2021-12-05 DIAGNOSIS — G9349 Other encephalopathy: Secondary | ICD-10-CM | POA: Diagnosis present

## 2021-12-05 DIAGNOSIS — E43 Unspecified severe protein-calorie malnutrition: Secondary | ICD-10-CM | POA: Diagnosis present

## 2021-12-05 DIAGNOSIS — Z880 Allergy status to penicillin: Secondary | ICD-10-CM

## 2021-12-05 DIAGNOSIS — Z7901 Long term (current) use of anticoagulants: Secondary | ICD-10-CM | POA: Diagnosis not present

## 2021-12-05 DIAGNOSIS — R001 Bradycardia, unspecified: Secondary | ICD-10-CM | POA: Diagnosis present

## 2021-12-05 DIAGNOSIS — N281 Cyst of kidney, acquired: Secondary | ICD-10-CM | POA: Diagnosis not present

## 2021-12-05 DIAGNOSIS — J0100 Acute maxillary sinusitis, unspecified: Secondary | ICD-10-CM | POA: Diagnosis not present

## 2021-12-05 DIAGNOSIS — W19XXXA Unspecified fall, initial encounter: Secondary | ICD-10-CM | POA: Diagnosis present

## 2021-12-05 DIAGNOSIS — S0990XA Unspecified injury of head, initial encounter: Secondary | ICD-10-CM | POA: Diagnosis not present

## 2021-12-05 DIAGNOSIS — I6523 Occlusion and stenosis of bilateral carotid arteries: Secondary | ICD-10-CM | POA: Diagnosis not present

## 2021-12-05 DIAGNOSIS — E876 Hypokalemia: Secondary | ICD-10-CM | POA: Diagnosis present

## 2021-12-05 DIAGNOSIS — R0602 Shortness of breath: Secondary | ICD-10-CM | POA: Diagnosis not present

## 2021-12-05 DIAGNOSIS — G9340 Encephalopathy, unspecified: Secondary | ICD-10-CM | POA: Diagnosis present

## 2021-12-05 DIAGNOSIS — Z515 Encounter for palliative care: Secondary | ICD-10-CM | POA: Diagnosis not present

## 2021-12-05 DIAGNOSIS — G9389 Other specified disorders of brain: Secondary | ICD-10-CM | POA: Diagnosis present

## 2021-12-05 DIAGNOSIS — Z781 Physical restraint status: Secondary | ICD-10-CM | POA: Diagnosis not present

## 2021-12-05 DIAGNOSIS — R338 Other retention of urine: Secondary | ICD-10-CM | POA: Diagnosis present

## 2021-12-05 DIAGNOSIS — R0689 Other abnormalities of breathing: Secondary | ICD-10-CM | POA: Diagnosis not present

## 2021-12-05 DIAGNOSIS — Z8616 Personal history of COVID-19: Secondary | ICD-10-CM | POA: Diagnosis not present

## 2021-12-05 DIAGNOSIS — Z86711 Personal history of pulmonary embolism: Secondary | ICD-10-CM

## 2021-12-05 DIAGNOSIS — R011 Cardiac murmur, unspecified: Secondary | ICD-10-CM | POA: Diagnosis not present

## 2021-12-05 DIAGNOSIS — N400 Enlarged prostate without lower urinary tract symptoms: Secondary | ICD-10-CM | POA: Insufficient documentation

## 2021-12-05 DIAGNOSIS — R4701 Aphasia: Secondary | ICD-10-CM | POA: Diagnosis present

## 2021-12-05 DIAGNOSIS — Z825 Family history of asthma and other chronic lower respiratory diseases: Secondary | ICD-10-CM

## 2021-12-05 DIAGNOSIS — N1330 Unspecified hydronephrosis: Secondary | ICD-10-CM | POA: Diagnosis not present

## 2021-12-05 DIAGNOSIS — S199XXA Unspecified injury of neck, initial encounter: Secondary | ICD-10-CM | POA: Diagnosis not present

## 2021-12-05 DIAGNOSIS — G8929 Other chronic pain: Secondary | ICD-10-CM | POA: Diagnosis present

## 2021-12-05 DIAGNOSIS — R1312 Dysphagia, oropharyngeal phase: Secondary | ICD-10-CM | POA: Diagnosis present

## 2021-12-05 DIAGNOSIS — K219 Gastro-esophageal reflux disease without esophagitis: Secondary | ICD-10-CM | POA: Diagnosis present

## 2021-12-05 DIAGNOSIS — R252 Cramp and spasm: Secondary | ICD-10-CM | POA: Diagnosis present

## 2021-12-05 DIAGNOSIS — N390 Urinary tract infection, site not specified: Secondary | ICD-10-CM | POA: Diagnosis present

## 2021-12-05 DIAGNOSIS — R319 Hematuria, unspecified: Secondary | ICD-10-CM | POA: Diagnosis present

## 2021-12-05 DIAGNOSIS — I700 Atherosclerosis of aorta: Secondary | ICD-10-CM | POA: Diagnosis not present

## 2021-12-05 DIAGNOSIS — Z8669 Personal history of other diseases of the nervous system and sense organs: Secondary | ICD-10-CM | POA: Diagnosis not present

## 2021-12-05 DIAGNOSIS — T380X5A Adverse effect of glucocorticoids and synthetic analogues, initial encounter: Secondary | ICD-10-CM | POA: Diagnosis present

## 2021-12-05 DIAGNOSIS — K5730 Diverticulosis of large intestine without perforation or abscess without bleeding: Secondary | ICD-10-CM | POA: Diagnosis not present

## 2021-12-05 DIAGNOSIS — D6861 Antiphospholipid syndrome: Secondary | ICD-10-CM | POA: Diagnosis present

## 2021-12-05 DIAGNOSIS — R471 Dysarthria and anarthria: Secondary | ICD-10-CM | POA: Diagnosis present

## 2021-12-05 DIAGNOSIS — Z66 Do not resuscitate: Secondary | ICD-10-CM | POA: Diagnosis not present

## 2021-12-05 DIAGNOSIS — I080 Rheumatic disorders of both mitral and aortic valves: Secondary | ICD-10-CM | POA: Diagnosis present

## 2021-12-05 DIAGNOSIS — J320 Chronic maxillary sinusitis: Principal | ICD-10-CM

## 2021-12-05 DIAGNOSIS — I2699 Other pulmonary embolism without acute cor pulmonale: Secondary | ICD-10-CM | POA: Diagnosis not present

## 2021-12-05 DIAGNOSIS — Y92239 Unspecified place in hospital as the place of occurrence of the external cause: Secondary | ICD-10-CM | POA: Diagnosis not present

## 2021-12-05 DIAGNOSIS — U071 COVID-19: Secondary | ICD-10-CM | POA: Diagnosis not present

## 2021-12-05 DIAGNOSIS — N179 Acute kidney failure, unspecified: Secondary | ICD-10-CM | POA: Diagnosis present

## 2021-12-05 DIAGNOSIS — R5383 Other fatigue: Secondary | ICD-10-CM | POA: Diagnosis not present

## 2021-12-05 DIAGNOSIS — R451 Restlessness and agitation: Secondary | ICD-10-CM | POA: Diagnosis not present

## 2021-12-05 DIAGNOSIS — Z888 Allergy status to other drugs, medicaments and biological substances status: Secondary | ICD-10-CM

## 2021-12-05 DIAGNOSIS — R9431 Abnormal electrocardiogram [ECG] [EKG]: Secondary | ICD-10-CM | POA: Diagnosis not present

## 2021-12-05 DIAGNOSIS — D1803 Hemangioma of intra-abdominal structures: Secondary | ICD-10-CM | POA: Diagnosis not present

## 2021-12-05 DIAGNOSIS — R4182 Altered mental status, unspecified: Secondary | ICD-10-CM | POA: Diagnosis not present

## 2021-12-05 DIAGNOSIS — K6389 Other specified diseases of intestine: Secondary | ICD-10-CM | POA: Diagnosis not present

## 2021-12-05 DIAGNOSIS — D72829 Elevated white blood cell count, unspecified: Secondary | ICD-10-CM | POA: Diagnosis present

## 2021-12-05 DIAGNOSIS — Z4682 Encounter for fitting and adjustment of non-vascular catheter: Secondary | ICD-10-CM | POA: Diagnosis not present

## 2021-12-05 DIAGNOSIS — Z79899 Other long term (current) drug therapy: Secondary | ICD-10-CM

## 2021-12-05 DIAGNOSIS — R52 Pain, unspecified: Secondary | ICD-10-CM | POA: Diagnosis not present

## 2021-12-05 DIAGNOSIS — G4700 Insomnia, unspecified: Secondary | ICD-10-CM | POA: Diagnosis present

## 2021-12-05 DIAGNOSIS — N401 Enlarged prostate with lower urinary tract symptoms: Secondary | ICD-10-CM | POA: Diagnosis present

## 2021-12-05 LAB — URINALYSIS, ROUTINE W REFLEX MICROSCOPIC
Bilirubin Urine: NEGATIVE
Glucose, UA: NEGATIVE mg/dL
Hgb urine dipstick: NEGATIVE
Ketones, ur: NEGATIVE mg/dL
Leukocytes,Ua: NEGATIVE
Nitrite: NEGATIVE
Protein, ur: NEGATIVE mg/dL
Specific Gravity, Urine: 1.014 (ref 1.005–1.030)
pH: 7 (ref 5.0–8.0)

## 2021-12-05 LAB — COMPREHENSIVE METABOLIC PANEL
ALT: 17 U/L (ref 0–44)
AST: 25 U/L (ref 15–41)
Albumin: 3.9 g/dL (ref 3.5–5.0)
Alkaline Phosphatase: 48 U/L (ref 38–126)
Anion gap: 10 (ref 5–15)
BUN: 17 mg/dL (ref 8–23)
CO2: 24 mmol/L (ref 22–32)
Calcium: 9.5 mg/dL (ref 8.9–10.3)
Chloride: 107 mmol/L (ref 98–111)
Creatinine, Ser: 1.03 mg/dL (ref 0.61–1.24)
GFR, Estimated: >60 mL/min (ref >60)
Glucose, Bld: 113 mg/dL — ABNORMAL HIGH (ref 70–99)
Potassium: 3.7 mmol/L (ref 3.5–5.1)
Sodium: 141 mmol/L (ref 135–145)
Total Bilirubin: 0.8 mg/dL (ref 0.3–1.2)
Total Protein: 6.7 g/dL (ref 6.5–8.1)

## 2021-12-05 LAB — CBC
HCT: 41.1 % (ref 39.0–52.0)
Hemoglobin: 13.8 g/dL (ref 13.0–17.0)
MCH: 30.5 pg (ref 26.0–34.0)
MCHC: 33.6 g/dL (ref 30.0–36.0)
MCV: 90.7 fL (ref 80.0–100.0)
Platelets: 205 10*3/uL (ref 150–400)
RBC: 4.53 MIL/uL (ref 4.22–5.81)
RDW: 12.5 % (ref 11.5–15.5)
WBC: 5.1 10*3/uL (ref 4.0–10.5)
nRBC: 0 % (ref 0.0–0.2)

## 2021-12-05 MED ORDER — LORAZEPAM 2 MG/ML IJ SOLN
0.5000 mg | Freq: Once | INTRAMUSCULAR | Status: AC
  Administered 2021-12-05: 0.5 mg via INTRAVENOUS
  Filled 2021-12-05: qty 1

## 2021-12-05 NOTE — ED Triage Notes (Signed)
Three weeks ago after being diagnosed with covid. Pt. Is having slow to answer questions, brain fog., progressively getting worse. Pt. Is also starting to wander.  Hx of PE.  Wife would like to have a neuro consult. Alert and oriented X3.

## 2021-12-05 NOTE — ED Provider Triage Note (Signed)
Emergency Medicine Provider Triage Evaluation Note  Spencer Ross , a 71 y.o. male  was evaluated in triage.  Pt complains of AMS for the past 3 weeks since having COVID. Wife states he is having trouble with his vision, processing, hearing, and recent memory. Seems confused and is wandering around the house. No falls or syncope. Patient acknowledges what wife is saying but has no complaints of his own at this time. Went to a doctor that specializes in [place] symptoms, who gave him IV vitamins and ivermectin. Hx of PE, on xarelto  Review of Systems  Positive: AMS Negative: CP, SOB, lightheadedness  Physical Exam  BP (!) 142/84 (BP Location: Right Arm)   Pulse 70   Temp 98.5 F (36.9 C) (Oral)   Resp 16   Ht 6' 0.5" (1.842 m)   Wt 80.7 kg   SpO2 99%   BMI 23.81 kg/m  Gen:   Awake, no distress   Resp:  Normal effort  MSK:   Moves extremities without difficulty  Other:  Gait slightly off balance  Medical Decision Making  Medically screening exam initiated at 3:14 PM.  Appropriate orders placed.  Pedro Earls was informed that the remainder of the evaluation will be completed by another provider, this initial triage assessment does not replace that evaluation, and the importance of remaining in the ED until their evaluation is complete.     Anurag Scarfo T, PA-C 12/05/21 1517

## 2021-12-05 NOTE — ED Notes (Signed)
Pt back from MRI at this time

## 2021-12-05 NOTE — ED Notes (Signed)
Pt in MRI at this time 

## 2021-12-05 NOTE — ED Provider Notes (Signed)
[facility] EMERGENCY DEPARTMENT Provider Note   CSN: [id number] Arrival date & time: 12/05/21  1443     History {Add pertinent medical, surgical, social history, OB history to HPI:1} No chief complaint on file.   Spencer Ross is a 71 y.o. male.  Pt is a 71 yo male with a pmhx significant for PE (on Xarelto), BPH, and high cholesterol.  Pt's family and friend gives most of the hx.  Pt was diagnosed with covid about 3 weeks ago.  He is on blood thinners, so was not put on paxlovid.  His doctor also did not prescribe molnupiravir.  He did get prescribed zithromax, decadron, vitamins and ivermectin (by a Covid "specialist").  Pt's family said he's been more confused since he was diagnosed.  He sleeps a lot and has not had a good appetite.  Pt's family said he is not his normal self.        Home Medications Prior to Admission medications   Medication Sig Start Date End Date Taking? Authorizing Provider  benzonatate (TESSALON) 200 MG capsule Take 200 mg by mouth 3 (three) times daily as needed. 11/21/21  Yes [provider]  busPIRone (BUSPAR) 7.5 MG tablet Take 7.5 mg by mouth daily. 10/13/21  Yes [provider]  dexamethasone (DECADRON) 6 MG tablet Take 6 mg by mouth once. 11/15/21  Yes [provider]  predniSONE (DELTASONE) 5 MG tablet Take by mouth. 12/02/21  Yes [provider]  Ascorbic Acid (VITAMIN C) 1000 MG tablet Take 1,000 mg by mouth daily.    [provider]  cholecalciferol (VITAMIN D3) 25 MCG (1000 UNIT) tablet Take 1,000 Units by mouth daily.    [provider]  magnesium citrate SOLN Take by mouth once. Pt dilutes and uses as a spray.    [provider]  Multiple Vitamin (MULTIVITAMIN WITH MINERALS) TABS tablet Take 1 tablet by mouth daily.    [provider]  omeprazole (PRILOSEC) 20 MG capsule Take 20 mg by mouth daily.    [provider]  Rivaroxaban (XARELTO) 20 MG  TABS tablet Take 1 tablet (20 mg total) by mouth daily with supper. Start taking March 29th, 2015 04/23/13   Theodis Blaze, MD  saw palmetto 160 MG capsule Take 160 mg by mouth daily.    [provider]  traZODone (DESYREL) 50 MG tablet Take 50 mg by mouth at bedtime as needed for sleep. Pt takes 1/2 pill    [provider]  zinc gluconate 50 MG tablet Take 50 mg by mouth daily.    [provider]      Allergies    Diphenhydramine, Penicillins, and Trazodone hcl    Review of Systems   Review of Systems  Constitutional:  Positive for activity change, appetite change and fatigue.  Neurological:        Confusion    Physical Exam Updated Vital Signs BP 137/80   Pulse (!) 54   Temp 98.6 F (37 C) (Oral)   Resp 12   Ht 6' 0.5" (1.842 m)   Wt 80.7 kg   SpO2 98%   BMI 23.81 kg/m  Physical Exam Vitals and nursing note reviewed.  Constitutional:      Appearance: Normal appearance.  HENT:     Head: Normocephalic and atraumatic.     Right Ear: External ear normal.     Left Ear: External ear normal.     Nose: Nose normal.     Mouth/Throat:  Mouth: Mucous membranes are moist.  Eyes:     Extraocular Movements: Extraocular movements intact.     Conjunctiva/sclera: Conjunctivae normal.     Pupils: Pupils are equal, round, and reactive to light.  Cardiovascular:     Rate and Rhythm: Normal rate and regular rhythm.     Pulses: Normal pulses.     Heart sounds: Normal heart sounds.  Pulmonary:     Effort: Pulmonary effort is normal.     Breath sounds: Normal breath sounds.  Abdominal:     General: Abdomen is flat. Bowel sounds are normal.     Palpations: Abdomen is soft.  Musculoskeletal:        General: Normal range of motion.     Cervical back: Normal range of motion and neck supple.  Skin:    General: Skin is warm.     Capillary Refill: Capillary refill takes less than 2 seconds.  Neurological:     General: No focal deficit present.     Mental  Status: He is alert and oriented to person, place, and time.  Psychiatric:        Mood and Affect: Mood normal.        Behavior: Behavior normal.     ED Results / Procedures / Treatments   Labs (all labs ordered are listed, but only abnormal results are displayed) Labs Reviewed  COMPREHENSIVE METABOLIC PANEL - Abnormal; Notable for the following components:      Result Value   Glucose, Bld 113 (*)    All other components within normal limits  URINALYSIS, ROUTINE W REFLEX MICROSCOPIC - Abnormal; Notable for the following components:   APPearance CLOUDY (*)    All other components within normal limits  CBC    EKG EKG Interpretation  Date/Time:  Friday December 05 2021 14:48:15 EST Ventricular Rate:  68 PR Interval:  170 QRS Duration: 100 QT Interval:  412 QTC Calculation: 438 R Axis:   54 Text Interpretation: Normal sinus rhythm Normal ECG When compared with ECG of 30-Mar-2013 20:53, PREVIOUS ECG IS PRESENT No significant change since last tracing Confirmed by Isla Pence [phone]) on 12/05/2021 9:16:00 PM  Radiology CT Head Wo Contrast  Result Date: 12/05/2021 CLINICAL DATA:  Altered mental status, recent COVID diagnosis. EXAM: CT HEAD WITHOUT CONTRAST TECHNIQUE: Contiguous axial images were obtained from the base of the skull through the vertex without intravenous contrast. RADIATION DOSE REDUCTION: This exam was performed according to the departmental dose-optimization program which includes automated exposure control, adjustment of the mA and/or kV according to patient size and/or use of iterative reconstruction technique. COMPARISON:  None Available. FINDINGS: Brain: The brainstem, cerebellum, cerebral peduncles, thalami, basal ganglia, basilar cisterns, and ventricular system appear within normal limits. No intracranial hemorrhage, mass lesion, or acute CVA. Vascular: There is atherosclerotic calcification of the cavernous carotid arteries bilaterally. Skull: Unremarkable  Sinuses/Orbits: Acute right maxillary sinusitis with chronic bilateral ethmoid and left maxillary sinusitis and mild chronic left frontal sinusitis. Other: No supplemental non-categorized findings. IMPRESSION: 1. No acute intracranial findings. 2. Acute right maxillary sinusitis with chronic bilateral ethmoid and left maxillary sinusitis and mild chronic left frontal sinusitis. 3. Atherosclerosis. Electronically Signed   By: Van Clines M.D.   On: 12/05/2021 16:19    Procedures Procedures  {Document cardiac monitor, telemetry assessment procedure when appropriate:1}  Medications Ordered in ED Medications  LORazepam (ATIVAN) injection 0.5 mg (0.5 mg Intravenous Given 12/05/21 2044)    ED Course/ Medical Decision Making/ A&P  Medical Decision Making Amount and/or Complexity of Data Reviewed Radiology: ordered.  Risk Prescription drug management.   This patient presents to the ED for concern of confusion, this involves an extensive number of treatment options, and is a complaint that carries with it a high risk of complications and morbidity.  The differential diagnosis includes infection, cva, long covid   Co morbidities that complicate the patient evaluation  PE (on Xarelto), BPH, and high cholesterol   Additional history obtained:  Additional history obtained from epic chart review External records from outside source obtained and reviewed including family   Lab Tests:  I Ordered, and personally interpreted labs.  The pertinent results include:  cbc nl, cmp nl, ua nl   Imaging Studies ordered:  I ordered imaging studies including ct head/MRI head  I independently visualized and interpreted imaging which showed  CT head: 1. No acute intracranial findings.  2. Acute right maxillary sinusitis with chronic bilateral ethmoid  and left maxillary sinusitis and mild chronic left frontal  sinusitis.  3. Atherosclerosis.   I agree with the  radiologist interpretation   Cardiac Monitoring:  The patient was maintained on a cardiac monitor.  I personally viewed and interpreted the cardiac monitored which showed an underlying rhythm of: nsr   Medicines ordered and prescription drug management:  I ordered medication including ***  for ***  Reevaluation of the patient after these medicines showed that the patient {resolved/improved/worsened:23923::"improved"} I have reviewed the patients home medicines and have made adjustments as needed   Test Considered:  ***   Critical Interventions:  ***   Consultations Obtained:  I requested consultation with the ***,  and discussed lab and imaging findings as well as pertinent plan - they recommend: ***   Problem List / ED Course:  Sinusitis:  likely from covid.  Pt d/c with augmentin and flonase.   Reevaluation:  After the interventions noted above, I reevaluated the patient and found that they have :improved   Social Determinants of Health:  Lives at home with wife   Dispostion:  After consideration of the diagnostic results and the patients response to treatment, I feel that the patent would benefit from ***.    {Document critical care time when appropriate:1} {Document review of labs and clinical decision tools ie heart score, Chads2Vasc2 etc:1}  {Document your independent review of radiology images, and any outside records:1} {Document your discussion with family members, caretakers, and with consultants:1} {Document social determinants of health affecting pt's care:1} {Document your decision making why or why not admission, treatments were needed:1} Final Clinical Impression(s) / ED Diagnoses Final diagnoses:  Right maxillary sinusitis    Rx / DC Orders ED Discharge Orders     None

## 2021-12-05 NOTE — ED Provider Notes (Incomplete)
[facility] EMERGENCY DEPARTMENT Provider Note   CSN: [id number] Arrival date & time: 12/05/21  1443     History {Add pertinent medical, surgical, social history, OB history to HPI:1} No chief complaint on file.   Spencer Ross is a 71 y.o. male.  Pt is a 71 yo male with a pmhx significant for PE (on Xarelto), BPH, and high cholesterol.  Pt's family and friend gives most of the hx.  Pt was diagnosed with covid about 3 weeks ago.  He is on blood thinners, so was not put on paxlovid.  His doctor also did not prescribe molnupiravir.  He did get prescribed zithromax, decadron, vitamins and ivermectin (by a Covid "specialist").  Pt's family said he's been more confused since he was diagnosed.  He sleeps a lot and has not had a good appetite.  Pt's family said he is not his normal self.        Home Medications Prior to Admission medications   Medication Sig Start Date End Date Taking? Authorizing Provider  benzonatate (TESSALON) 200 MG capsule Take 200 mg by mouth 3 (three) times daily as needed. 11/21/21  Yes [provider]  busPIRone (BUSPAR) 7.5 MG tablet Take 7.5 mg by mouth daily. 10/13/21  Yes [provider]  dexamethasone (DECADRON) 6 MG tablet Take 6 mg by mouth once. 11/15/21  Yes [provider]  predniSONE (DELTASONE) 5 MG tablet Take by mouth. 12/02/21  Yes [provider]  Ascorbic Acid (VITAMIN C) 1000 MG tablet Take 1,000 mg by mouth daily.    [provider]  cholecalciferol (VITAMIN D3) 25 MCG (1000 UNIT) tablet Take 1,000 Units by mouth daily.    [provider]  magnesium citrate SOLN Take by mouth once. Pt dilutes and uses as a spray.    [provider]  Multiple Vitamin (MULTIVITAMIN WITH MINERALS) TABS tablet Take 1 tablet by mouth daily.    [provider]  omeprazole (PRILOSEC) 20 MG capsule Take 20 mg by mouth daily.    [provider]  Rivaroxaban (XARELTO) 20 MG  TABS tablet Take 1 tablet (20 mg total) by mouth daily with supper. Start taking March 29th, 2015 04/23/13   Theodis Blaze, MD  saw palmetto 160 MG capsule Take 160 mg by mouth daily.    [provider]  traZODone (DESYREL) 50 MG tablet Take 50 mg by mouth at bedtime as needed for sleep. Pt takes 1/2 pill    [provider]  zinc gluconate 50 MG tablet Take 50 mg by mouth daily.    [provider]      Allergies    Diphenhydramine, Penicillins, and Trazodone hcl    Review of Systems   Review of Systems  Constitutional:  Positive for activity change, appetite change and fatigue.  Neurological:        Confusion    Physical Exam Updated Vital Signs BP 137/80   Pulse (!) 54   Temp 98.6 F (37 C) (Oral)   Resp 12   Ht 6' 0.5" (1.842 m)   Wt 80.7 kg   SpO2 98%   BMI 23.81 kg/m  Physical Exam Vitals and nursing note reviewed.  Constitutional:      Appearance: Normal appearance.  HENT:     Head: Normocephalic and atraumatic.     Right Ear: External ear normal.     Left Ear: External ear normal.     Nose: Nose normal.     Mouth/Throat:  Mouth: Mucous membranes are moist.  Eyes:     Extraocular Movements: Extraocular movements intact.     Conjunctiva/sclera: Conjunctivae normal.     Pupils: Pupils are equal, round, and reactive to light.  Cardiovascular:     Rate and Rhythm: Normal rate and regular rhythm.     Pulses: Normal pulses.     Heart sounds: Normal heart sounds.  Pulmonary:     Effort: Pulmonary effort is normal.     Breath sounds: Normal breath sounds.  Abdominal:     General: Abdomen is flat. Bowel sounds are normal.     Palpations: Abdomen is soft.  Musculoskeletal:        General: Normal range of motion.     Cervical back: Normal range of motion and neck supple.  Skin:    General: Skin is warm.     Capillary Refill: Capillary refill takes less than 2 seconds.  Neurological:     General: No focal deficit present.     Mental  Status: He is alert and oriented to person, place, and time.  Psychiatric:        Mood and Affect: Mood normal.        Behavior: Behavior normal.     ED Results / Procedures / Treatments   Labs (all labs ordered are listed, but only abnormal results are displayed) Labs Reviewed  COMPREHENSIVE METABOLIC PANEL - Abnormal; Notable for the following components:      Result Value   Glucose, Bld 113 (*)    All other components within normal limits  URINALYSIS, ROUTINE W REFLEX MICROSCOPIC - Abnormal; Notable for the following components:   APPearance CLOUDY (*)    All other components within normal limits  CBC    EKG EKG Interpretation  Date/Time:  Friday December 05 2021 14:48:15 EST Ventricular Rate:  68 PR Interval:  170 QRS Duration: 100 QT Interval:  412 QTC Calculation: 438 R Axis:   54 Text Interpretation: Normal sinus rhythm Normal ECG When compared with ECG of 30-Mar-2013 20:53, PREVIOUS ECG IS PRESENT No significant change since last tracing Confirmed by Isla Pence [phone]) on 12/05/2021 9:16:00 PM  Radiology CT Head Wo Contrast  Result Date: 12/05/2021 CLINICAL DATA:  Altered mental status, recent COVID diagnosis. EXAM: CT HEAD WITHOUT CONTRAST TECHNIQUE: Contiguous axial images were obtained from the base of the skull through the vertex without intravenous contrast. RADIATION DOSE REDUCTION: This exam was performed according to the departmental dose-optimization program which includes automated exposure control, adjustment of the mA and/or kV according to patient size and/or use of iterative reconstruction technique. COMPARISON:  None Available. FINDINGS: Brain: The brainstem, cerebellum, cerebral peduncles, thalami, basal ganglia, basilar cisterns, and ventricular system appear within normal limits. No intracranial hemorrhage, mass lesion, or acute CVA. Vascular: There is atherosclerotic calcification of the cavernous carotid arteries bilaterally. Skull: Unremarkable  Sinuses/Orbits: Acute right maxillary sinusitis with chronic bilateral ethmoid and left maxillary sinusitis and mild chronic left frontal sinusitis. Other: No supplemental non-categorized findings. IMPRESSION: 1. No acute intracranial findings. 2. Acute right maxillary sinusitis with chronic bilateral ethmoid and left maxillary sinusitis and mild chronic left frontal sinusitis. 3. Atherosclerosis. Electronically Signed   By: Van Clines M.D.   On: 12/05/2021 16:19    Procedures Procedures  {Document cardiac monitor, telemetry assessment procedure when appropriate:1}  Medications Ordered in ED Medications  LORazepam (ATIVAN) injection 0.5 mg (0.5 mg Intravenous Given 12/05/21 2044)    ED Course/ Medical Decision Making/ A&P  Medical Decision Making Amount and/or Complexity of Data Reviewed Radiology: ordered.  Risk Prescription drug management.   This patient presents to the ED for concern of confusion, this involves an extensive number of treatment options, and is a complaint that carries with it a high risk of complications and morbidity.  The differential diagnosis includes infection, cva, long covid   Co morbidities that complicate the patient evaluation  PE (on Xarelto), BPH, and high cholesterol   Additional history obtained:  Additional history obtained from epic chart review External records from outside source obtained and reviewed including family   Lab Tests:  I Ordered, and personally interpreted labs.  The pertinent results include:  cbc nl, cmp nl, ua nl   Imaging Studies ordered:  I ordered imaging studies including ct head/MRI head  I independently visualized and interpreted imaging which showed  CT head: 1. No acute intracranial findings.  2. Acute right maxillary sinusitis with chronic bilateral ethmoid  and left maxillary sinusitis and mild chronic left frontal  sinusitis.  3. Atherosclerosis.  MRI head: Cortical  restricted diffusion primarily in the right cerebral hemisphere, bilateral caudate heads, and anterior putamina, which are nonspecific but concerning for Creutzfeldt-Jakob disease. The differential includes other encephalopathies, such as autoimmune encephalitis or hepatic encephalopathy. A lumbar puncture is recommended.  I agree with the radiologist interpretation   Cardiac Monitoring:  The patient was maintained on a cardiac monitor.  I personally viewed and interpreted the cardiac monitored which showed an underlying rhythm of: nsr   Medicines ordered and prescription drug management:  I ordered medication including ***  for ***  Reevaluation of the patient after these medicines showed that the patient {resolved/improved/worsened:23923::"improved"} I have reviewed the patients home medicines and have made adjustments as needed   Test Considered:  ***   Critical Interventions:  ***   Consultations Obtained:  I requested consultation with the ***,  and discussed lab and imaging findings as well as pertinent plan - they recommend: ***   Problem List / ED Course:  Sinusitis:  likely from covid.  Pt d/c with augmentin and flonase.   Reevaluation:  After the interventions noted above, I reevaluated the patient and found that they have :improved   Social Determinants of Health:  Lives at home with wife   Dispostion:  After consideration of the diagnostic results and the patients response to treatment, I feel that the patent would benefit from ***.    {Document critical care time when appropriate:1} {Document review of labs and clinical decision tools ie heart score, Chads2Vasc2 etc:1}  {Document your independent review of radiology images, and any outside records:1} {Document your discussion with family members, caretakers, and with consultants:1} {Document social determinants of health affecting pt's care:1} {Document your decision making why or why not  admission, treatments were needed:1} Final Clinical Impression(s) / ED Diagnoses Final diagnoses:  Right maxillary sinusitis    Rx / DC Orders ED Discharge Orders     None

## 2021-12-05 NOTE — ED Notes (Signed)
Report given to MRI staff at this time

## 2021-12-05 NOTE — ED Notes (Signed)
MRI tech informed that patient has received medicine and ready to go to MRI.

## 2021-12-06 ENCOUNTER — Inpatient Hospital Stay (HOSPITAL_COMMUNITY): Payer: Medicare PPO

## 2021-12-06 ENCOUNTER — Encounter (HOSPITAL_COMMUNITY): Payer: Self-pay | Admitting: Internal Medicine

## 2021-12-06 DIAGNOSIS — Z66 Do not resuscitate: Secondary | ICD-10-CM | POA: Diagnosis not present

## 2021-12-06 DIAGNOSIS — G9349 Other encephalopathy: Secondary | ICD-10-CM

## 2021-12-06 DIAGNOSIS — G9389 Other specified disorders of brain: Secondary | ICD-10-CM | POA: Diagnosis present

## 2021-12-06 DIAGNOSIS — N400 Enlarged prostate without lower urinary tract symptoms: Secondary | ICD-10-CM | POA: Diagnosis not present

## 2021-12-06 DIAGNOSIS — G0490 Encephalitis and encephalomyelitis, unspecified: Secondary | ICD-10-CM | POA: Diagnosis present

## 2021-12-06 DIAGNOSIS — U071 COVID-19: Secondary | ICD-10-CM

## 2021-12-06 DIAGNOSIS — R4182 Altered mental status, unspecified: Secondary | ICD-10-CM

## 2021-12-06 DIAGNOSIS — R1312 Dysphagia, oropharyngeal phase: Secondary | ICD-10-CM | POA: Diagnosis not present

## 2021-12-06 DIAGNOSIS — T380X5A Adverse effect of glucocorticoids and synthetic analogues, initial encounter: Secondary | ICD-10-CM | POA: Diagnosis present

## 2021-12-06 DIAGNOSIS — E7800 Pure hypercholesterolemia, unspecified: Secondary | ICD-10-CM | POA: Diagnosis present

## 2021-12-06 DIAGNOSIS — Z885 Allergy status to narcotic agent status: Secondary | ICD-10-CM | POA: Diagnosis not present

## 2021-12-06 DIAGNOSIS — R338 Other retention of urine: Secondary | ICD-10-CM | POA: Diagnosis present

## 2021-12-06 DIAGNOSIS — G9340 Encephalopathy, unspecified: Secondary | ICD-10-CM | POA: Diagnosis present

## 2021-12-06 DIAGNOSIS — J988 Other specified respiratory disorders: Secondary | ICD-10-CM | POA: Diagnosis not present

## 2021-12-06 DIAGNOSIS — W19XXXA Unspecified fall, initial encounter: Secondary | ICD-10-CM | POA: Diagnosis present

## 2021-12-06 DIAGNOSIS — A8100 Creutzfeldt-Jakob disease, unspecified: Secondary | ICD-10-CM | POA: Diagnosis present

## 2021-12-06 DIAGNOSIS — R011 Cardiac murmur, unspecified: Secondary | ICD-10-CM | POA: Diagnosis not present

## 2021-12-06 DIAGNOSIS — N179 Acute kidney failure, unspecified: Secondary | ICD-10-CM | POA: Diagnosis present

## 2021-12-06 DIAGNOSIS — E870 Hyperosmolality and hypernatremia: Secondary | ICD-10-CM | POA: Diagnosis present

## 2021-12-06 DIAGNOSIS — Y92239 Unspecified place in hospital as the place of occurrence of the external cause: Secondary | ICD-10-CM | POA: Diagnosis not present

## 2021-12-06 DIAGNOSIS — R52 Pain, unspecified: Secondary | ICD-10-CM | POA: Diagnosis not present

## 2021-12-06 DIAGNOSIS — I2699 Other pulmonary embolism without acute cor pulmonale: Secondary | ICD-10-CM | POA: Diagnosis not present

## 2021-12-06 DIAGNOSIS — I080 Rheumatic disorders of both mitral and aortic valves: Secondary | ICD-10-CM | POA: Diagnosis present

## 2021-12-06 DIAGNOSIS — Z8616 Personal history of COVID-19: Secondary | ICD-10-CM | POA: Diagnosis not present

## 2021-12-06 DIAGNOSIS — R4701 Aphasia: Secondary | ICD-10-CM | POA: Diagnosis present

## 2021-12-06 DIAGNOSIS — R451 Restlessness and agitation: Secondary | ICD-10-CM | POA: Diagnosis not present

## 2021-12-06 DIAGNOSIS — Z781 Physical restraint status: Secondary | ICD-10-CM | POA: Diagnosis not present

## 2021-12-06 DIAGNOSIS — Z7901 Long term (current) use of anticoagulants: Secondary | ICD-10-CM | POA: Diagnosis not present

## 2021-12-06 DIAGNOSIS — R001 Bradycardia, unspecified: Secondary | ICD-10-CM | POA: Diagnosis present

## 2021-12-06 DIAGNOSIS — E876 Hypokalemia: Secondary | ICD-10-CM | POA: Diagnosis present

## 2021-12-06 DIAGNOSIS — N390 Urinary tract infection, site not specified: Secondary | ICD-10-CM | POA: Diagnosis present

## 2021-12-06 DIAGNOSIS — Z880 Allergy status to penicillin: Secondary | ICD-10-CM | POA: Diagnosis not present

## 2021-12-06 DIAGNOSIS — Z515 Encounter for palliative care: Secondary | ICD-10-CM | POA: Diagnosis not present

## 2021-12-06 DIAGNOSIS — E43 Unspecified severe protein-calorie malnutrition: Secondary | ICD-10-CM | POA: Diagnosis present

## 2021-12-06 DIAGNOSIS — Z86711 Personal history of pulmonary embolism: Secondary | ICD-10-CM | POA: Diagnosis not present

## 2021-12-06 DIAGNOSIS — K219 Gastro-esophageal reflux disease without esophagitis: Secondary | ICD-10-CM | POA: Diagnosis present

## 2021-12-06 DIAGNOSIS — D6861 Antiphospholipid syndrome: Secondary | ICD-10-CM | POA: Diagnosis present

## 2021-12-06 LAB — HIV ANTIBODY (ROUTINE TESTING W REFLEX): HIV Screen 4th Generation wRfx: NONREACTIVE

## 2021-12-06 LAB — AMMONIA: Ammonia: 11 umol/L (ref 9–35)

## 2021-12-06 LAB — GLUCOSE, CAPILLARY: Glucose-Capillary: 139 mg/dL — ABNORMAL HIGH (ref 70–99)

## 2021-12-06 MED ORDER — STERILE WATER FOR INJECTION IJ SOLN
INTRAMUSCULAR | Status: AC
  Filled 2021-12-06: qty 10

## 2021-12-06 MED ORDER — BUSPIRONE HCL 10 MG PO TABS: 15.0000 mg | ORAL_TABLET | Freq: Two times a day (BID) | ORAL | Status: DC | PRN

## 2021-12-06 MED ORDER — SODIUM CHLORIDE 0.9% FLUSH
3.0000 mL | Freq: Two times a day (BID) | INTRAVENOUS | Status: DC
  Administered 2021-12-06 – 2021-12-25 (×31): 3 mL via INTRAVENOUS

## 2021-12-06 MED ORDER — LORAZEPAM 2 MG/ML IJ SOLN
1.0000 mg | Freq: Once | INTRAMUSCULAR | Status: AC
  Administered 2021-12-06: 1 mg via INTRAVENOUS
  Filled 2021-12-06: qty 1

## 2021-12-06 MED ORDER — DEXTROSE 5 % IV SOLN
800.0000 mg | Freq: Three times a day (TID) | INTRAVENOUS | Status: DC
  Administered 2021-12-06 (×3): 800 mg via INTRAVENOUS
  Filled 2021-12-06 (×5): qty 16

## 2021-12-06 MED ORDER — ACETAMINOPHEN 650 MG RE SUPP: 650.0000 mg | Freq: Four times a day (QID) | RECTAL | Status: DC | PRN

## 2021-12-06 MED ORDER — SODIUM CHLORIDE 0.9 % IV SOLN: INTRAVENOUS | Status: DC

## 2021-12-06 MED ORDER — PANTOPRAZOLE SODIUM 40 MG PO TBEC
40.0000 mg | DELAYED_RELEASE_TABLET | Freq: Every day | ORAL | Status: DC
  Administered 2021-12-06 – 2021-12-07 (×2): 40 mg via ORAL
  Filled 2021-12-06 (×4): qty 1

## 2021-12-06 MED ORDER — LORAZEPAM 2 MG/ML IJ SOLN: 2.0000 mg | INTRAMUSCULAR | Status: DC | PRN

## 2021-12-06 MED ORDER — ZIPRASIDONE MESYLATE 20 MG IM SOLR
20.0000 mg | Freq: Once | INTRAMUSCULAR | Status: AC
  Administered 2021-12-06: 20 mg via INTRAMUSCULAR
  Filled 2021-12-06: qty 20

## 2021-12-06 MED ORDER — LORAZEPAM 2 MG/ML IJ SOLN
1.0000 mg | INTRAMUSCULAR | Status: DC | PRN
  Administered 2021-12-06 – 2021-12-08 (×5): 1 mg via INTRAVENOUS
  Filled 2021-12-06 (×5): qty 1

## 2021-12-06 MED ORDER — HALOPERIDOL LACTATE 5 MG/ML IJ SOLN
2.0000 mg | Freq: Once | INTRAMUSCULAR | Status: AC
  Administered 2021-12-06: 2 mg via INTRAVENOUS
  Filled 2021-12-06: qty 1

## 2021-12-06 MED ORDER — HEPARIN SODIUM (PORCINE) 5000 UNIT/ML IJ SOLN
5000.0000 [IU] | Freq: Three times a day (TID) | INTRAMUSCULAR | Status: DC
  Administered 2021-12-06 – 2021-12-07 (×4): 5000 [IU] via SUBCUTANEOUS
  Filled 2021-12-06 (×5): qty 1

## 2021-12-06 MED ORDER — IOHEXOL 350 MG/ML SOLN
80.0000 mL | Freq: Once | INTRAVENOUS | Status: AC | PRN
  Administered 2021-12-06: 80 mL via INTRAVENOUS

## 2021-12-06 MED ORDER — ETOMIDATE 2 MG/ML IV SOLN: 8.0000 mg | Freq: Once | INTRAVENOUS | Status: DC

## 2021-12-06 MED ORDER — TRAZODONE HCL 50 MG PO TABS
25.0000 mg | ORAL_TABLET | Freq: Every evening | ORAL | Status: DC | PRN
  Filled 2021-12-06: qty 1

## 2021-12-06 MED ORDER — BUSPIRONE HCL 15 MG PO TABS
15.0000 mg | ORAL_TABLET | Freq: Two times a day (BID) | ORAL | Status: DC
  Administered 2021-12-06 – 2021-12-08 (×3): 15 mg via ORAL
  Filled 2021-12-06 (×7): qty 1

## 2021-12-06 MED ORDER — ACETAMINOPHEN 325 MG PO TABS: 650.0000 mg | ORAL_TABLET | Freq: Four times a day (QID) | ORAL | Status: DC | PRN

## 2021-12-06 MED ORDER — SODIUM CHLORIDE 0.9 % IV SOLN
250.0000 mL | INTRAVENOUS | Status: DC | PRN
  Administered 2021-12-18: 250 mL via INTRAVENOUS

## 2021-12-06 MED ORDER — SODIUM CHLORIDE 0.9 % IV SOLN
1000.0000 mg | INTRAVENOUS | Status: DC
  Administered 2021-12-06 – 2021-12-09 (×4): 1000 mg via INTRAVENOUS
  Filled 2021-12-06 (×7): qty 16

## 2021-12-06 MED ORDER — SODIUM CHLORIDE 0.9% FLUSH: 3.0000 mL | INTRAVENOUS | Status: DC | PRN

## 2021-12-06 NOTE — ED Notes (Signed)
Pt up to use urinal, pt confused and unwilling to get back in the bed. Staff at bedside for 30 minutes trying to help him get back into bed with wife. Patient is increasingly disoriented and becoming more agitated.

## 2021-12-06 NOTE — ED Notes (Signed)
O2 was at 87% at this time on RA. O2 at 2LPM [place] initiated and O2 increased to 92-95% on the monitor. Will continue to monitor.

## 2021-12-06 NOTE — ED Notes (Addendum)
Pt in 4-point soft restraints at time of RN shift change - all are applied appropriately, good color, . Pt withdrawals/kicks out when blankets restraints are adjusted. No real purposeful movement, does not follow commands. Continuous EEG in progress. MD reported that LP was attempted (skin just cleaned - pt not stuck), but unsuccessful due to pt level of agitation and will defer to Neurology for further sedation decisions for LP.

## 2021-12-06 NOTE — Assessment & Plan Note (Signed)
Follows with Dr. Alinda Money. No h/o urinary retention and is currently comfortable  Plan Continue home regimen

## 2021-12-06 NOTE — Procedures (Signed)
Patient Name: Spencer Ross  MRN: [id number]  Epilepsy Attending: Lora Havens  Referring Physician/Provider: Lorenza Chick, MD  Duration: 12/06/2021 0526 to 12/07/2021 3149  Patient history: 71yo M with ams. EEG to evaluate for seizure  Level of alertness: Awake, asleep  AEDs during EEG study: None  Technical aspects: This EEG study was done with scalp electrodes positioned according to the 10-20 International system of electrode placement. Electrical activity was reviewed with band pass filter of 1-'70Hz'$ , sensitivity of 7 uV/mm, display speed of 27m/sec with a '60Hz'$  notched filter applied as appropriate. EEG data were recorded continuously and digitally stored.  Video monitoring was available and reviewed as appropriate.  Description: The posterior dominant rhythm consists of 9 Hz activity of moderate voltage (25-35 uV) seen predominantly in posterior head regions, symmetric and reactive to eye opening and eye closing. Sleep was characterized by sleep spindles (12-'14hz'$ ), maximal fronto-central region.EEG also showed near continuous 3 to 6 Hz theta-delta slowing in right fronto-temporal region which at times appeared sharply contoured. Hyperventilation and photic stimulation were not performed.     ABNORMALITY - Continuous slow, right fronto-temporal region   IMPRESSION: This study is suggestive of cortical dysfunction arising from right fronto-temporal region likely secondary to underlying structural abnormality, post-ictal state. No seizures or epileptiform discharges were seen throughout the recording.  Mystic Labo OBarbra Sarks

## 2021-12-06 NOTE — Progress Notes (Signed)
NEUROLOGY CONSULTATION PROGRESS NOTE   Date of service: December 06, 2021 Patient Name: Spencer Ross MRN:  [id number] DOB:  08/13/1950  Brief HPI  Spencer Ross is a 71 y.o. 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  MRI Brain with diffusion restriction in the cortical ribboning pattern. More notable on the R hemisphere.  EEG with R hemispheric slowing but no seizures.   Interval Hx   Continues to have poor attention.  Vitals   Vitals:   12/06/21 0700 12/06/21 0845 12/06/21 0945 12/06/21 1330  BP: (!) 140/84  117/76 118/73  Pulse: 84  69 77  Resp: '16  15 10  '$ Temp:  98.1 F (36.7 C)    TempSrc:  Oral    SpO2: 96%  99% 93%  Weight:      Height:         Body mass index is 23.81 kg/m.  Physical Exam   General: Laying comfortably in bed; in no acute distress.  HENT: Normal oropharynx and mucosa. Normal external appearance of ears and nose.  Neck: Supple, no pain or tenderness  CV: No JVD. No peripheral edema.  Pulmonary: Symmetric Chest rise. Normal respiratory effort.  Abdomen: Soft to touch, non-tender.  Ext: No cyanosis, edema, or deformity  Skin: No rash. Normal palpation of skin.   Musculoskeletal: Normal digits and nails by inspection. No clubbing.   Neurologic Examination  Mental status/Cognition: Alert, oriented to self, place, month and year, good attention.  Speech/language: Fluent, comprehension intact, object naming intact, repetition intact. Cranial nerves:   CN II Pupils equal and reactive to light, no VF deficits    CN III,IV,VI EOM intact, no gaze preference or deviation, no nystagmus    CN V normal sensation in V1, V2, and V3 segments bilaterally    CN VII no asymmetry, no nasolabial fold flattening    CN VIII normal hearing to speech    CN IX & X normal palatal elevation, no uvular deviation   CN XI 5/5 head  turn and 5/5 shoulder shrug bilaterally    CN XII midline tongue protrusion    Motor:  Muscle bulk: normal, tone normal, pronator drift none tremor none Mvmt Root Nerve  Muscle Right Left Comments  SA C5/6 Ax Deltoid 5 5   EF C5/6 Mc Biceps 5 5   EE C6/7/8 Rad Triceps 5 5   WF C6/7 Med FCR     WE C7/8 PIN ECU     F Ab C8/T1 U ADM/FDI 5 5   HF L1/2/3 Fem Illopsoas 5 5   KE L2/3/4 Fem Quad 5 5   DF L4/5 D Peron Tib Ant 5 5   PF S1/2 Tibial Grc/Sol 5 5    Reflexes:  Right Left Comments  Pectoralis      Biceps (C5/6) 2 2   Brachioradialis (C5/6) 2 2    Triceps (C6/7) 2 2    Patellar (L3/4) 2 2    Achilles (S1)      Hoffman      Plantar     Jaw jerk    Sensation:  Light touch Intact throughout   Pin prick    Temperature    Vibration   Proprioception    Coordination/Complex Motor:  - Finger to Nose intact BL - Heel to shin intact BL - Rapid alternating movement are normal - Gait:  deferred.  Labs   Basic Metabolic Panel:  Lab Results  Component Value Date   NA 141 12/05/2021   K 3.7 12/05/2021   CO2 24 12/05/2021   GLUCOSE 113 (H) 12/05/2021   BUN 17 12/05/2021   CREATININE 1.03 12/05/2021   CALCIUM 9.5 12/05/2021   GFRNONAA >60 12/05/2021   GFRAA >90 04/01/2013   HbA1c: No results found for: "HGBA1C" LDL: No results found for: "LDLCALC" Urine Drug Screen: No results found for: "LABOPIA", "COCAINSCRNUR", "LABBENZ", "AMPHETMU", "THCU", "LABBARB"  Alcohol Level No results found for: "ETH" No results found for: "PHENYTOIN", "ZONISAMIDE", "LAMOTRIGINE", "LEVETIRACETA" No results found for: "PHENYTOIN", "PHENOBARB", "VALPROATE", "CBMZ"  Imaging and Diagnostic studies   MR BRAIN WO CONTRAST  Cortical restricted diffusion primarily in the right cerebral hemisphere, bilateral caudate heads, and anterior putamina, which are nonspecific but concerning for Creutzfeldt-Jakob disease. The differential includes other encephalopathies, such as autoimmune encephalitis  or hepatic encephalopathy. A lumbar puncture is recommended.  LTM EEG: This study is suggestive of cortical dysfunction arising from right fronto-temporal region likely secondary to underlying structural abnormality. No seizures or epileptiform discharges were seen throughout the recording.    Impression   Spencer Ross is a 71 y.o. 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 MRI Brain with diffusion restriction in the cortical ribboning pattern. More notable on the R hemisphere. EEG with R hemispheric slowing but no seizures. CT Chest A/P with no occult malignancy.  Unable to get LP given Xarelto with last dose on 12/04/21 in PM.  Recommendations  - IV solumedrol '1000mg'$  daily x 5 doses - continue acyclovir until LP rules out HSV - continue LTM overnight. Will discontinue if no seizures tomorrow AM. - LP with protein, glucose, cell counts in tubes 1 and 4, oligoclonal bands, IgG index, 14-3-3, RT quic, meningitis encephalitis panel, autoimmune encephalitis panel, fungal culture, VDRL, cryptococcal antigen) will talk to pathology lab first due to concern for CJD - Serum autoimmune encephalitis panel ______________________________________________________________________   Thank you for the opportunity to take part in the care of this patient. If you have any further questions, please contact the neurology consultation attending.  Signed,  [place] Pager Number [phone]

## 2021-12-06 NOTE — Assessment & Plan Note (Signed)
No active complaints.   Plan Continue PPI.

## 2021-12-06 NOTE — ED Notes (Signed)
Patient is resting comfortably. Warm blanket provided at this time.

## 2021-12-06 NOTE — ED Notes (Signed)
Report given to Danielle, RN.  

## 2021-12-06 NOTE — Progress Notes (Signed)
Received call from Lab, due to possible contamination of the CSF, cannot run the Chemistry - will have senior management work on it in the AM.. notified covering MD.

## 2021-12-06 NOTE — Progress Notes (Signed)
Pharmacy Antibiotic Note  Spencer Ross is a 71 y.o. 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  Pharmacy has been consulted for acyclovir dosing.  Plan: Acyclovir '800mg'$  IV q8 hours  Height: 6' 0.5" (184.2 cm) Weight: 80.7 kg (178 lb) IBW/kg (Calculated) : 78.75  Temp (24hrs), Avg:98.4 F (36.9 C), Min:98 F (36.7 C), Max:98.6 F (37 C)  Recent Labs  Lab 12/05/21 1527  WBC 5.1  CREATININE 1.03    Estimated Creatinine Clearance: 76.5 mL/min (by C-G formula based on SCr of 1.03 mg/dL).    Allergies  Allergen Reactions   Diphenhydramine     Other reaction(s): prostate swelling, increase urination   Penicillins Other (See Comments)    "childhood reaction from mother"   Trazodone Hcl     Other reaction(s): weak stream    Antimicrobials this admission: Acyclovir 11/11>>  Microbiology results: pending  Thank you for allowing pharmacy to be a part of this patient's care.  Jodean Lima Isaak Delmundo 12/06/2021 5:28 AM

## 2021-12-06 NOTE — ED Provider Notes (Signed)
  [facility] EMERGENCY DEPARTMENT Provider Note   CSN: [id number] Arrival date & time: 12/05/21  1443  Procedures .Lumbar Puncture  Date/Time: 12/06/2021 9:06 PM  Performed by: Renard Matter, MD Authorized by: Fransico Meadow, MD   Consent:    Consent obtained:  Verbal and written   Consent given by:  Spouse   Risks, benefits, and alternatives were discussed: yes     Risks discussed:  Bleeding, infection, pain, repeat procedure, nerve damage and headache   Alternatives discussed:  No treatment, delayed treatment and observation Universal protocol:    Procedure explained and questions answered to patient or proxy's satisfaction: yes     Relevant documents present and verified: yes     Test results available: yes     Imaging studies available: yes     Required blood products, implants, devices, and special equipment available: yes     Immediately prior to procedure a time out was called: yes     Site/side marked: yes     Patient identity confirmed:  Arm band Pre-procedure details:    Procedure purpose:  Diagnostic   Preparation: Patient was prepped and draped in usual sterile fashion   Anesthesia:    Anesthesia method:  Local infiltration   Local anesthetic:  Lidocaine 1% w/o epi Procedure details:    Lumbar space:  L3-L4 interspace   Patient position:  Sitting   Needle gauge:  20   Ultrasound guidance: no     Number of attempts:  3   Fluid appearance:  Clear   Tubes of fluid:  4   Total volume (ml):  24 Post-procedure details:    Puncture site:  Direct pressure applied and adhesive bandage applied   Procedure completion:  Tolerated well, no immediate complications      Renard Matter, MD 12/07/21 0003    Fransico Meadow, MD 12/07/21 1245

## 2021-12-06 NOTE — Progress Notes (Signed)
On examination this evening, patient's aphasia is markedly worse compared to the day prior. 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  No charge same-day note

## 2021-12-06 NOTE — ED Notes (Signed)
Pt transported to MRI 

## 2021-12-06 NOTE — ED Notes (Signed)
Patient had an episode of incontinence. Patient cleaned, linens changed, condom catheter in place.

## 2021-12-06 NOTE — ED Notes (Signed)
Patient returned from CT

## 2021-12-06 NOTE — ED Notes (Signed)
Pt transported to CT ?

## 2021-12-06 NOTE — ED Notes (Signed)
Pt is resting comfortably at this time. Pt has equal chest rise and fall. No acute distress noted at this time.

## 2021-12-06 NOTE — Progress Notes (Signed)
LTM EEG hooked up and running - no initial skin breakdown - push button tested - [facility] monitoring.  

## 2021-12-06 NOTE — Consult Note (Signed)
Neurology Consultation Reason for Consult: Altered mental status Requesting Physician: Adella Hare  CC: Altered mental status  History is obtained from: Patient and chart review, attempted to reach family but was unable to do so  HPI: Spencer Ross is a 71 y.o. retired Clinical biochemist with past medical history significant for antiphospholipid syndrome on Xarelto, chronic joint pains, recent COVID-19 infection  He has had progressive confusion since he had COVID-19 about 3 weeks ago, additionally with some wandering behavior.  I was unable to reach family personally for history overnight but per ED provider he was not prescribed small new peer reviewer but was given Zithromax, Decadron, vitamins and ivermectin (though his wife was treated with Paxlovid).  He was vaccinated x2 with 1 booster.  Decadron was changed to prednisone started at 25 mg daily and now on 15 mg daily, per history obtained by admitting MD  ROS: Limited by mental status, denies any current pain, notes he is having some word finding issues  Past Medical History:  Diagnosis Date   Hypercholesteremia    Pulmonary embolism (HCC)    Past Surgical History:  Procedure Laterality Date   KNEE SURGERY     right   Current Outpatient Medications  Medication Instructions   ascorbic acid (VITAMIN C) 1,000 mg, Oral, Daily   benzonatate (TESSALON) 200 mg, 3 times daily PRN   bismuth subsalicylate (PEPTO BISMOL) 262 MG/15ML suspension 30 mLs, Oral, Every 6 hours PRN   busPIRone (BUSPAR) 15 mg, Oral, 2 times daily PRN   Calcium-Magnesium-Vitamin D (CALCIUM MAGNESIUM PO) 5 mLs, Oral, Daily, Oral liquid   Cholecalciferol (VITAMIN D3) 125 MCG (5000 UT) TABS 1 tablet, Oral, Daily   dexamethasone (DECADRON) 6 mg,  Once   IVERMECTIN PO 18 mg, Oral, Daily, Ivermectin oil '10mg'$ /ml suspension.    Multiple Vitamin (MULTIVITAMIN WITH MINERALS) TABS tablet 1 tablet, Daily   omeprazole (PRILOSEC) 20 mg, Daily   predniSONE (DELTASONE)  5-15 mg, Oral, See admin instructions, Take 15 mg for 5 days, then take 10 mg for 5 days, and then take 5 mg for 5 days and then stop.   rivaroxaban (XARELTO) 20 mg, Oral, Daily with supper, Start taking March 29th, 2015   saw palmetto 160 mg, Daily   traZODone (DESYREL) 25 mg, Oral, At bedtime PRN   zinc gluconate 50 mg, Oral, Daily     Family History  Problem Relation Age of Onset   Heart disease Father    Asthma Paternal Grandfather      Social History:  reports that he has never smoked. He has never used smokeless tobacco. He reports that he does not drink alcohol and does not use drugs.   Exam: Current vital signs: BP 137/80   Pulse (!) 54   Temp 98.6 F (37 C) (Oral)   Resp 12   Ht 6' 0.5" (1.842 m)   Wt 80.7 kg   SpO2 98%   BMI 23.81 kg/m  Vital signs in last 24 hours: Temp:  [98.5 F (36.9 C)-98.6 F (37 C)] 98.6 F (37 C) (11/10 2021) Pulse Rate:  [51-70] 54 (11/10 2345) Resp:  [11-18] 12 (11/10 2345) BP: (110-167)/(79-95) 137/80 (11/10 2345) SpO2:  [94 %-99 %] 98 % (11/10 2345) Weight:  [80.7 kg] 80.7 kg (11/10 1452)   Physical Exam  Constitutional: Appears well-developed and well-nourished.  Psych: Affect somewhat flat Eyes: No scleral injection HENT: No oropharyngeal obstruction.  MSK: no joint deformities.  Cardiovascular: Normal rate and regular rhythm. Perfusing extremities well Respiratory:  Effort normal, non-labored breathing GI: Soft.  No distension. There is no tenderness.  Skin: Warm dry and intact visible skin  Neuro: Mental Status: Patient is awake, alert, oriented to person, place, month, year, but vague on details of situation Attention history.  This word finding difficulties and sometimes makes word paraphasic errors such as stuck instead of struck No neglect.  Significant motor apraxia, unable to copy complex finger movements.  Cranial Nerves: II: Difficulty counting fingers in the right lower field.  Pupils are equal, round, and  reactive to light.   III,IV, VI: EOMI without ptosis or diploplia.  Saccadic pursuits. V: Facial sensation is symmetric to light touch VII: Facial movement is symmetric.  Occasional tic upwards of the left face VIII: hearing is intact to voice X: Uvula elevates symmetrically XI: Shoulder shrug is symmetric. XII: tongue is midline without atrophy or fasciculations.  Motor: Tone is increased in the bilateral upper extremities especially, and spastic, right upper extremity worse than the left. Bulk is normal. 5/5 strength was present in all four extremities.  At times appears to have almost waxy cataplexy Sensory: Sensation is symmetric to light touch and temperature in the arms and legs. Deep Tendon Reflexes: 3+ in the right brachioradialis, 2+ in the left brachioradialis, 3+ bilateral patellar's, 2+ bilateral Achilles Cerebellar: FNF and HKS are intact bilaterally (requires multiple repetitions and demonstration to be able to complete heel-to-shin) Gait:  Deferred in acute setting   I have reviewed labs in epic and the results pertinent to this consultation are:  Basic Metabolic Panel: Recent Labs  Lab 12/05/21 1527  NA 141  K 3.7  CL 107  CO2 24  GLUCOSE 113*  BUN 17  CREATININE 1.03  CALCIUM 9.5    CBC: Recent Labs  Lab 12/05/21 1527  WBC 5.1  HGB 13.8  HCT 41.1  MCV 90.7  PLT 205    Coagulation Studies: No results for input(s): "LABPROT", "INR" in the last 72 hours.    I have reviewed the images obtained: MRI brain: Cortical restricted diffusion primarily in the right cerebral hemisphere, bilateral caudate heads, and anterior putamina, which are nonspecific but concerning for Creutzfeldt-Jakob disease. The differential includes other encephalopathies, such as autoimmune encephalitis or hepatic encephalopathy. A lumbar puncture is recommended.  Impression: Concern for autoimmune encephalitis versus viral encephalitis versus CJD.  With his history of  antiphospholipid syndrome, he is at high risk of autoimmune processes  Recommendations: - CT chest abdomen pelvis for malignancy screening - Overnight EEG  - Consider MRI brain w/ contrast after EEG complete - Acyclovir to cover empirically for possible HSV though more concern for autoimmune encephalitis - Methylprednisolone 1 g daily x 5 days   - LP once safe from anticoag prespective (last dose Xarelto 12/04/2021 at [place])  - LP with protein, glucose, cell counts in tubes 1 and 4, oligoclonal bands, IgG index, 14-3-3, RT quic, meningitis encephalitis panel, autoimmune encephalitis panel, fungal culture, VDRL, cryptococcal antigen) will talk to pathology lab first due to concern for CJD - Serum autoimmune encephalitis panel  Lesleigh Noe MD-PhD [facility] [phone] Available 7 PM to 7 AM, outside of these hours please call Neurologist on call as listed on Amion.

## 2021-12-06 NOTE — Progress Notes (Addendum)
Patient admitted to the hospital earlier this morning by Dr. Linda Hedges  Patient seen and examined.  He was extremely agitated earlier today and was climbing out of bed.  He did receive sedative medication.  At the time of my assessment, the patient is in bed and is sleeping.  Briefly does open eyes to voice, but falls back asleep.  Currently has EEG wires on.  Assessment/plan  Acute encephalopathy -Patient's wife describes progressive confusion and agitation over the past few weeks since he was diagnosed with COVID -Initial CT head was unremarkable for intracranial process -MRI brain shows cortical restricted diffusion primarily in the right cerebral hemisphere, bilateral caudate heads and anterior putamina. -Neurology following -MRI of the brain with contrast has been ordered -He is on continuous EEG -He has been started on high-dose Solu-Medrol 1 g daily x5 days -Empirically started on acyclovir -Plans are for lumbar puncture once safe from anticoagulation perspective (last dose of Xarelto on 12/04/2021) -Until then, continue to treat supportively.  Will likely need to use chemical restraints.  Will need close observation by staff  Recent COVID-19 infection -First diagnosed approximately 3 weeks ago -He has been vaccinated -He was taking steroids as well as ivermectin prior to admission  Antiphospholipid antibody syndrome -On chronic anticoagulation -Xarelto on hold for now in order to prepare for lumbar puncture  History of pulmonary embolus -Patient had PE approximately 8 years ago -With underlying antiphospholipid antibody, recommendations are for lifelong anticoagulation -Anticoagulation currently on hold in preparation for probable lumbar puncture. -Currently on subcu heparin  Anxiety -Continue on home dose of BuSpar  Updated patient's wife over the phone  Spencer Ross  Addendum 15:30: Informed by staff, the patient had an unwitnessed fall.  He was found on the floor and  was moved back to the stretcher.  When he was moved back to the stretcher, it was noted that he was moving all 4 extremities independently.  He was very agitated.  He did receive a dose of Ativan and Geodon.  At the time of my arrival, patient was medicated/sleeping.  Pupils are pinpoint and equal bilaterally.  He does not have any obvious bruising in his extremities or his trunk.  Difficult to assess for tenderness due to his mental status.  He does not have any obvious deformities in his lower extremities.  Since patient did have an unwitnessed fall, and recent anticoagulation, will check CT head, C-spine.  We will also check x-ray of pelvis.  Patient's wife was updated over the phone. Continue close monitoring.  [employer]

## 2021-12-06 NOTE — ED Notes (Signed)
LP successful without escalation to conscious sedation with administration of etomidate. Pt cooperative at this time, however, does not appear to have high level of understanding of what is being communicated with him (makes eye contact, but does not consistently follow directions). Pt cleaned and returned to bed. Restraints removed for procedure and remain off at this time. Wife at bedside. Sitter order placed.

## 2021-12-06 NOTE — ED Notes (Signed)
Patient sustained an unwitnessed fall. Patient was found on the ground by staff. Patient placed back in bed. Patient moving all extremities. MD notified.

## 2021-12-06 NOTE — Assessment & Plan Note (Signed)
Patient with h/o bilateral PE approximately 6 years ago. He has been on Xarelto for a long time.  Plan Hold Xarelto due to possible LP  SQ heparin for prophylaxis

## 2021-12-06 NOTE — Subjective & Objective (Signed)
Spencer Ross, a 71 y/o, followed for BPH, GERD was diagnosed with Covid 19 approximately 3 weeks ago. He had had Vaccine x 2 and booster x1. He reports he was symptomatic with sore throat, visual changes, "brain fog," weakness. He denies any respiratory symptoms. He was not started on Paxlovid as he takes Xarelto. He was started on invermectin, decadron later changed to prednisone starting at 25 mg daily and currently on 15 mg daily. He has had progressive confusion, cognitive slowing and some behavior changes prompting his presentation to the [facility] ED for further evaluation.

## 2021-12-06 NOTE — Assessment & Plan Note (Signed)
Patient with increased cognitive retardation, increased somnolence and per family some behavorial change. Labs w/o evidence of bacterial infection or metabolic derangement. 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 He has been on steroids starting with decadron then prednisone taper.  Plan Stop invermectin as a non-proven therapy  Hold Xarelto until neurology consult determines if LP is indicated  Hold prednisone until neurology consult recommendations are formulated in regard to treatment, I.e high dose       steroids

## 2021-12-06 NOTE — ED Notes (Signed)
Patient brought back from MRI, unable to complete MRI due to EEG leads not being compatible with MRI.

## 2021-12-06 NOTE — ED Notes (Signed)
Patient is resting comfortably. 

## 2021-12-06 NOTE — H&P (Signed)
History and Physical    JONATHON TAN WRU:[id number] DOB: 11/22/1950 DOA: 12/05/2021  DOS: the patient was seen and examined on 12/05/2021  PCP: Lujean Amel, MD   Patient coming from: Home  I have personally briefly reviewed patient's old medical records in [place]  Mr. Ervine, a 71 y/o, followed for BPH, GERD was diagnosed with Covid 19 approximately 3 weeks ago. He had had Vaccine x 2 and booster x1. He reports he was symptomatic with sore throat, visual changes, "brain fog," weakness. He denies any respiratory symptoms. He was not started on Paxlovid as he takes Xarelto. He was started on invermectin, decadron later changed to prednisone starting at 25 mg daily and currently on 15 mg daily. He has had progressive confusion, cognitive slowing and some behavior changes prompting his presentation to the [facility] ED for further evaluation.   ED Course: afebrile, 137/72  HR 60 RR 16. EDP exam unremarkable except for cognitive slowing. Lab: CBCD nl, Cmet glucose 113, CT head - no acute findings, chronic sinusitis noted. MRI - restricted diffusion pattern right cerebralhemispher, bilateral cuadate heads, anterior putamina. Differential Dx per radiology: Creutzfield-0Jakob vs autoimmune encephalopathy vs hepatic encephalopathy and Lumbar puncture recommended. Neurology consult is pending. TRH is called to admit patient for continued evaluation and treatment of encephalopathy  Review of Systems:  Review of Systems  Constitutional:  Negative for chills, fever and weight loss.  HENT: Negative.    Eyes: Negative.   Respiratory:  Negative for cough, shortness of breath and wheezing.   Cardiovascular:  Negative for chest pain and claudication.  Gastrointestinal:  Negative for heartburn, nausea and vomiting.  Genitourinary: Negative.        Slow stream 2/2 BPH  Musculoskeletal: Negative.   Neurological:        "Brain fog" with increased somnolence, changed behavior  Endo/Heme/Allergies:  Negative.     Past Medical History:  Diagnosis Date   Hypercholesteremia    Pulmonary embolism [facility])     Past Surgical History:  Procedure Laterality Date   KNEE SURGERY     right    Soc Hx - married 59 years, Two sons, one daughter, 6 grandchildren. Lives with his wife. 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    reports that he has never smoked. He has never used smokeless tobacco. He reports that he does not drink alcohol and does not use drugs.  Allergies  Allergen Reactions   Diphenhydramine     Other reaction(s): prostate swelling, increase urination   Penicillins Other (See Comments)    "childhood reaction from mother"   Trazodone Hcl     Other reaction(s): weak stream    Family History  Problem Relation Age of Onset   Heart disease Father    Asthma Paternal Grandfather     Prior to Admission medications   Medication Sig Start Date End Date Taking? Authorizing Provider  ascorbic acid (VITAMIN C) 500 MG tablet Take 1,000 mg by mouth daily.   Yes [provider]  bismuth subsalicylate (PEPTO BISMOL) 262 MG/15ML suspension Take 30 mLs by mouth every 6 (six) hours as needed for diarrhea or loose stools.   Yes [provider]  busPIRone (BUSPAR) 7.5 MG tablet Take 15 mg by mouth 2 (two) times daily as needed (anxiety). 10/13/21  Yes [provider]  Calcium-Magnesium-Vitamin D (CALCIUM MAGNESIUM PO) Take 5 mLs by mouth daily. Oral liquid   Yes [provider]  Cholecalciferol (VITAMIN D3) 125 MCG (5000 UT) TABS  Take 1 tablet by mouth daily.   Yes [provider]  IVERMECTIN PO Take 18 mg by mouth daily. Ivermectin oil '10mg'$ /ml suspension.   Yes [provider]  predniSONE (DELTASONE) 5 MG tablet Take 5-15 mg by mouth See admin instructions. Take 15 mg for 5 days, then take 10 mg for 5 days, and then take 5 mg for 5 days and then stop. 12/02/21  Yes [provider]  Rivaroxaban (XARELTO) 20 MG TABS tablet Take  1 tablet (20 mg total) by mouth daily with supper. Start taking March 29th, 2015 Patient taking differently: Take 20 mg by mouth daily with supper. 04/23/13  Yes Theodis Blaze, MD  traZODone (DESYREL) 50 MG tablet Take 25 mg by mouth at bedtime as needed for sleep.   Yes [provider]  zinc gluconate 50 MG tablet Take 50 mg by mouth daily.   Yes [provider]  benzonatate (TESSALON) 200 MG capsule Take 200 mg by mouth 3 (three) times daily as needed. Patient not taking: Reported on 12/06/2021 11/21/21   [provider]  dexamethasone (DECADRON) 6 MG tablet Take 6 mg by mouth once. Patient not taking: Reported on 12/06/2021 11/15/21   [provider]  Multiple Vitamin (MULTIVITAMIN WITH MINERALS) TABS tablet Take 1 tablet by mouth daily. Patient not taking: Reported on 12/06/2021    [provider]  omeprazole (PRILOSEC) 20 MG capsule Take 20 mg by mouth daily. Patient not taking: Reported on 12/06/2021    [provider]  saw palmetto 160 MG capsule Take 160 mg by mouth daily. Patient not taking: Reported on 12/06/2021    [provider]    Physical Exam: Vitals:   12/06/21 0100 12/06/21 0130 12/06/21 0145 12/06/21 0200  BP: (!) 152/81 (!) 125/93 (!) 141/87   Pulse: (!) 58 (!) 51 (!) 57 77  Resp: '15 16 10 '$ (!) 23  Temp:      TempSrc:      SpO2: 98% 96% 97% 97%  Weight:      Height:        Physical Exam Vitals and nursing note reviewed.  Constitutional:      General: He is not in acute distress.    Appearance: Normal appearance. He is normal weight. He is not toxic-appearing.     Comments: Awake, conversant but slow in forming his thoughts and answers.  HENT:     Head: Normocephalic and atraumatic.     Nose: Nose normal.     Mouth/Throat:     Mouth: Mucous membranes are moist.     Pharynx: Oropharynx is clear.  Eyes:     Extraocular Movements: Extraocular movements intact.     Conjunctiva/sclera: Conjunctivae  normal.     Pupils: Pupils are equal, round, and reactive to light.  Cardiovascular:     Rate and Rhythm: Normal rate and regular rhythm.     Pulses: Normal pulses.     Heart sounds: Normal heart sounds.  Pulmonary:     Effort: Pulmonary effort is normal.     Breath sounds: Normal breath sounds. No wheezing, rhonchi or rales.  Abdominal:     General: Abdomen is flat. Bowel sounds are normal.     Palpations: Abdomen is soft.  Musculoskeletal:        General: No swelling. Normal range of motion.     Cervical back: Normal range of motion and neck supple. No rigidity.     Right lower leg: No edema.  Left lower leg: No edema.  Lymphadenopathy:     Cervical: Cervical adenopathy present.  Skin:    General: Skin is warm and dry.     Coloration: Skin is not jaundiced.  Neurological:     Comments: Awake and responsive. Follows commands. CN II-XII nl facial symmetry and movement, PERRLA, EOMI, no visual field cuts MS - 4/4 and equal DTR- 2+  Cognition - oriented to place, date, context. Able to converse - relate details about his family, his work history, his medical problems but is slow to form thoughts and answers.      Labs on Admission: I have personally reviewed following labs and imaging studies  CBC: Recent Labs  Lab 12/05/21 1527  WBC 5.1  HGB 13.8  HCT 41.1  MCV 90.7  PLT 109   Basic Metabolic Panel: Recent Labs  Lab 12/05/21 1527  NA 141  K 3.7  CL 107  CO2 24  GLUCOSE 113*  BUN 17  CREATININE 1.03  CALCIUM 9.5   GFR: Estimated Creatinine Clearance: 76.5 mL/min (by C-G formula based on SCr of 1.03 mg/dL). Liver Function Tests: Recent Labs  Lab 12/05/21 1527  AST 25  ALT 17  ALKPHOS 48  BILITOT 0.8  PROT 6.7  ALBUMIN 3.9   No results for input(s): "LIPASE", "AMYLASE" in the last 168 hours. No results for input(s): "AMMONIA" in the last 168 hours. Coagulation Profile: No results for input(s): "INR", "PROTIME" in the last 168 hours. Cardiac  Enzymes: No results for input(s): "CKTOTAL", "CKMB", "CKMBINDEX", "TROPONINI" in the last 168 hours. BNP (last 3 results) No results for input(s): "PROBNP" in the last 8760 hours. HbA1C: No results for input(s): "HGBA1C" in the last 72 hours. CBG: No results for input(s): "GLUCAP" in the last 168 hours. Lipid Profile: No results for input(s): "CHOL", "HDL", "LDLCALC", "TRIG", "CHOLHDL", "LDLDIRECT" in the last 72 hours. Thyroid Function Tests: No results for input(s): "TSH", "T4TOTAL", "FREET4", "T3FREE", "THYROIDAB" in the last 72 hours. Anemia Panel: No results for input(s): "VITAMINB12", "FOLATE", "FERRITIN", "TIBC", "IRON", "RETICCTPCT" in the last 72 hours. Urine analysis:    Component Value Date/Time   COLORURINE YELLOW 12/05/2021 1557   APPEARANCEUR CLOUDY (A) 12/05/2021 1557   LABSPEC 1.014 12/05/2021 1557   PHURINE 7.0 12/05/2021 1557   GLUCOSEU NEGATIVE 12/05/2021 1557   HGBUR NEGATIVE 12/05/2021 1557   BILIRUBINUR NEGATIVE 12/05/2021 1557   KETONESUR NEGATIVE 12/05/2021 1557   PROTEINUR NEGATIVE 12/05/2021 1557   NITRITE NEGATIVE 12/05/2021 1557   LEUKOCYTESUR NEGATIVE 12/05/2021 1557    Radiological Exams on Admission: I have personally reviewed images MR BRAIN WO CONTRAST  Result Date: 12/05/2021 CLINICAL DATA:  Altered mental status, stroke suspected EXAM: MRI HEAD WITHOUT CONTRAST TECHNIQUE: Multiplanar, multiecho pulse sequences of the brain and surrounding structures were obtained without intravenous contrast. COMPARISON:  None Available. FINDINGS: Brain: Cortical restricted diffusion with ADC correlate in the right cerebral hemisphere (series 5, image 73, 85, and 90 for example). Possible involvement in the posterior left frontal lobe (series 7, image 53). These areas are associated with mildly increased T2 hyperintense signal but no cortical swelling. Mildly increased signal on diffusion-weighted imaging in the bilateral caudate heads (series 5, image 83) and, to  a lesser extent, the anterior putamen. No acute hemorrhage, mass, mass effect, or midline shift. No hydrocephalus or extra-axial collection. Cerebral volume is within normal limits for age. Scattered T2 hyperintense signal in the periventricular white matter, likely the sequela of mild chronic small vessel ischemic disease. Vascular: Normal arterial  flow voids. Skull and upper cervical spine: Normal marrow signal. Sinuses/Orbits: Mucosal thickening in the ethmoid air cells. No acute finding in the orbits. Other: The mastoids are well aerated. IMPRESSION: Cortical restricted diffusion primarily in the right cerebral hemisphere, bilateral caudate heads, and anterior putamina, which are nonspecific but concerning for Creutzfeldt-Jakob disease. The differential includes other encephalopathies, such as autoimmune encephalitis or hepatic encephalopathy. A lumbar puncture is recommended. Electronically Signed   By: Merilyn Baba M.D.   On: 12/05/2021 23:50   CT Head Wo Contrast  Result Date: 12/05/2021 CLINICAL DATA:  Altered mental status, recent COVID diagnosis. EXAM: CT HEAD WITHOUT CONTRAST TECHNIQUE: Contiguous axial images were obtained from the base of the skull through the vertex without intravenous contrast. RADIATION DOSE REDUCTION: This exam was performed according to the departmental dose-optimization program which includes automated exposure control, adjustment of the mA and/or kV according to patient size and/or use of iterative reconstruction technique. COMPARISON:  None Available. FINDINGS: Brain: The brainstem, cerebellum, cerebral peduncles, thalami, basal ganglia, basilar cisterns, and ventricular system appear within normal limits. No intracranial hemorrhage, mass lesion, or acute CVA. Vascular: There is atherosclerotic calcification of the cavernous carotid arteries bilaterally. Skull: Unremarkable Sinuses/Orbits: Acute right maxillary sinusitis with chronic bilateral ethmoid and left maxillary  sinusitis and mild chronic left frontal sinusitis. Other: No supplemental non-categorized findings. IMPRESSION: 1. No acute intracranial findings. 2. Acute right maxillary sinusitis with chronic bilateral ethmoid and left maxillary sinusitis and mild chronic left frontal sinusitis. 3. Atherosclerosis. Electronically Signed   By: Van Clines M.D.   On: 12/05/2021 16:19    EKG: I have personally reviewed EKG: NSR, nl EKG  Assessment/Plan Active Problems:   Acute encephalopathy   Pulmonary emboli (HCC)   GERD (gastroesophageal reflux disease)   BPH (benign prostatic hyperplasia)    Assessment and Plan: Acute encephalopathy Patient with increased cognitive retardation, increased somnolence and per family some behavorial change. Labs w/o evidence of bacterial infection or metabolic derangement. 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 He has been on steroids starting with decadron then prednisone taper.  Plan Stop invermectin as a non-proven therapy  Hold Xarelto until neurology consult determines if LP is indicated  Hold prednisone until neurology consult recommendations are formulated in regard to treatment, I.e high dose       steroids  BPH (benign prostatic hyperplasia) Follows with Dr. Alinda Money. No h/o urinary retention and is currently comfortable  Plan Continue home regimen  GERD (gastroesophageal reflux disease) No active complaints.   Plan Continue PPI.  Pulmonary emboli [facility]) Patient with h/o bilateral PE approximately 6 years ago. He has been on Xarelto for a long time.  Plan Hold Xarelto due to possible LP  SQ heparin for prophylaxis       DVT prophylaxis: SQ Heparin Code Status: Full Code Family Communication: left message for Mrs. [place]fields  Disposition Plan: home when medically stable  Consults called: Neurology - consult request per EDP, Dr. Curly Shores  Admission status: Inpatient, Med-Surg   Adella Hare, MD [facility] 12/06/2021, 2:14 AM

## 2021-12-07 DIAGNOSIS — G9340 Encephalopathy, unspecified: Secondary | ICD-10-CM | POA: Diagnosis not present

## 2021-12-07 DIAGNOSIS — I2699 Other pulmonary embolism without acute cor pulmonale: Secondary | ICD-10-CM

## 2021-12-07 DIAGNOSIS — N400 Enlarged prostate without lower urinary tract symptoms: Secondary | ICD-10-CM

## 2021-12-07 DIAGNOSIS — D6861 Antiphospholipid syndrome: Secondary | ICD-10-CM

## 2021-12-07 DIAGNOSIS — R4182 Altered mental status, unspecified: Secondary | ICD-10-CM | POA: Diagnosis not present

## 2021-12-07 LAB — CRYPTOCOCCAL ANTIGEN, CSF: Crypto Ag: NEGATIVE

## 2021-12-07 LAB — RPR: RPR Ser Ql: NONREACTIVE

## 2021-12-07 LAB — MENINGITIS/ENCEPHALITIS PANEL (CSF)

## 2021-12-07 LAB — VITAMIN B12: Vitamin B-12: 426 pg/mL (ref 180–914)

## 2021-12-07 LAB — TSH: TSH: 0.906 u[IU]/mL (ref 0.350–4.500)

## 2021-12-07 LAB — MRSA NEXT GEN BY PCR, NASAL: MRSA by PCR Next Gen: NOT DETECTED

## 2021-12-07 LAB — HIV ANTIBODY (ROUTINE TESTING W REFLEX): HIV Screen 4th Generation wRfx: NONREACTIVE

## 2021-12-07 MED ORDER — RIVAROXABAN 20 MG PO TABS
20.0000 mg | ORAL_TABLET | Freq: Every day | ORAL | Status: DC
  Filled 2021-12-07: qty 1

## 2021-12-07 MED ORDER — HEPARIN (PORCINE) 25000 UT/250ML-% IV SOLN
1100.0000 [IU]/h | INTRAVENOUS | Status: DC
  Administered 2021-12-08 – 2021-12-09 (×3): 1300 [IU]/h via INTRAVENOUS
  Administered 2021-12-10: 1200 [IU]/h via INTRAVENOUS
  Administered 2021-12-10: 1300 [IU]/h via INTRAVENOUS
  Administered 2021-12-11 – 2021-12-14 (×5): 1200 [IU]/h via INTRAVENOUS
  Administered 2021-12-15 – 2021-12-17 (×3): 1100 [IU]/h via INTRAVENOUS
  Filled 2021-12-07 (×13): qty 250

## 2021-12-07 MED ORDER — INSULIN ASPART 100 UNIT/ML IJ SOLN
0.0000 [IU] | Freq: Three times a day (TID) | INTRAMUSCULAR | Status: DC
  Administered 2021-12-08: 2 [IU] via SUBCUTANEOUS

## 2021-12-07 MED ORDER — HALOPERIDOL LACTATE 5 MG/ML IJ SOLN
2.0000 mg | Freq: Once | INTRAMUSCULAR | Status: AC
  Administered 2021-12-07: 2 mg via INTRAMUSCULAR
  Filled 2021-12-07: qty 1

## 2021-12-07 NOTE — Progress Notes (Signed)
PROGRESS NOTE    Spencer Ross  WYO:[id number] DOB: [birth year] DOA: 12/05/2021 PCP: Lujean Amel, MD    Brief Narrative:  71 year old male with a history of antiphospholipid antibody, pulmonary embolism in the past on chronic anticoagulation, recently had COVID-19 infection approximately 3 weeks ago.  His wife reported that he had progressive confusion over the past few weeks.  He was brought to the hospital for evaluation where MRI showed cortical restricted diffusion primarily in the right cerebral hemisphere, bilateral caudate heads and anterior putamina.  Neurology following.  He underwent lumbar puncture with several CSF studies in process.  Infectious meningitis/encephalitis panel noted to be negative.  He was started on high-dose IV steroids.  Overall mental status appears to be improving.   Assessment & Plan:   Principal Problem:   Acute encephalopathy Active Problems:   Pulmonary emboli (HCC)   Antiphospholipid syndrome (HCC)   GERD (gastroesophageal reflux disease)   BPH (benign prostatic hyperplasia)    Acute encephalopathy -Patient's wife describes progressive confusion and agitation over the past few weeks since he was diagnosed with COVID -Initial CT head was unremarkable for intracranial process -MRI brain shows cortical restricted diffusion primarily in the right cerebral hemisphere, bilateral caudate heads and anterior putamina. -Neurology following -MRI of the brain with contrast has been ordered -He is on continuous EEG, but does not appear to have had epileptiform discharges -Lumbar puncture performed on 11/11.  Several studies are still currently in process -He has been started on high-dose Solu-Medrol 1 g daily x5 days, currently day 2 of 5 -Empirically treated with acyclovir, but this was discontinued when HSV on CSF was negative -Overall mentation is improving today -Await further results from lumbar puncture and input from neurology   Recent COVID-19  infection -First diagnosed approximately 3 weeks ago -He has been vaccinated -He was taking steroids as well as ivermectin prior to admission   Antiphospholipid antibody syndrome -On chronic anticoagulation -Xarelto was on hold for lumbar puncture -Discussed with neurology, will resume today   History of pulmonary embolus -Patient had PE approximately 8 years ago -With underlying antiphospholipid antibody, recommendations are for lifelong anticoagulation -He is anticoagulated with Xarelto   Anxiety -Continue on home dose of BuSpar   DVT prophylaxis:  rivaroxaban (XARELTO) tablet 20 mg  Code Status: Full code Family Communication: Updated patient's wife Disposition Plan: Status is: Inpatient Remains inpatient appropriate because: Continued work-up of encephalopathy     Consultants:  Neurology  Procedures:  Lumbar puncture 11/11  Antimicrobials:      Subjective: Patient is laying in bed, he wakes up to voice.  He is able to carry on appropriate conversation.  He has not required further Ativan.  Does not have much recollection of events from yesterday.  Objective: Vitals:   12/07/21 0300 12/07/21 0750 12/07/21 1139 12/07/21 1518  BP: (!) 133/108 129/81 136/81 129/76  Pulse: 79  77 78  Resp: '16  17 20  '$ Temp: (!) 97.3 F (36.3 C) 97.6 F (36.4 C) 98.2 F (36.8 C) 98.2 F (36.8 C)  TempSrc: Oral Oral Oral Oral  SpO2: 97%  94% 94%  Weight:      Height:        Intake/Output Summary (Last 24 hours) at 12/07/2021 1854 Last data filed at 12/07/2021 1519 Gross per 24 hour  Intake 1273.4 ml  Output 1350 ml  Net -76.6 ml   Filed Weights   12/05/21 1452 12/06/21 2217  Weight: 80.7 kg 81.3 kg    Examination:  General exam: Appears calm and comfortable  Respiratory system: Clear to auscultation. Respiratory effort normal. Cardiovascular system: S1 & S2 heard, RRR. No JVD, murmurs, rubs, gallops or clicks. No pedal edema. Gastrointestinal system: Abdomen is  nondistended, soft and nontender. No organomegaly or masses felt. Normal bowel sounds heard. Central nervous system: Alert and oriented. No focal neurological deficits. Extremities: Symmetric 5 x 5 power. Skin: No rashes, lesions or ulcers Psychiatry: He is awake, alert, calm, still mildly confused at times but able to carry on appropriate conversation    Data Reviewed: I have personally reviewed following labs and imaging studies  CBC: Recent Labs  Lab 12/05/21 1527  WBC 5.1  HGB 13.8  HCT 41.1  MCV 90.7  PLT 478   Basic Metabolic Panel: Recent Labs  Lab 12/05/21 1527  NA 141  K 3.7  CL 107  CO2 24  GLUCOSE 113*  BUN 17  CREATININE 1.03  CALCIUM 9.5   GFR: Estimated Creatinine Clearance: 78.9 mL/min (by C-G formula based on SCr of 1.03 mg/dL). Liver Function Tests: Recent Labs  Lab 12/05/21 1527  AST 25  ALT 17  ALKPHOS 48  BILITOT 0.8  PROT 6.7  ALBUMIN 3.9   No results for input(s): "LIPASE", "AMYLASE" in the last 168 hours. Recent Labs  Lab 12/06/21 2307  AMMONIA 11   Coagulation Profile: No results for input(s): "INR", "PROTIME" in the last 168 hours. Cardiac Enzymes: No results for input(s): "CKTOTAL", "CKMB", "CKMBINDEX", "TROPONINI" in the last 168 hours. BNP (last 3 results) No results for input(s): "PROBNP" in the last 8760 hours. HbA1C: No results for input(s): "HGBA1C" in the last 72 hours. CBG: Recent Labs  Lab 12/06/21 2214  GLUCAP 139*   Lipid Profile: No results for input(s): "CHOL", "HDL", "LDLCALC", "TRIG", "CHOLHDL", "LDLDIRECT" in the last 72 hours. Thyroid Function Tests: Recent Labs    12/06/21 2307  TSH 0.906   Anemia Panel: Recent Labs    12/06/21 2307  VITAMINB12 426   Sepsis Labs: No results for input(s): "PROCALCITON", "LATICACIDVEN" in the last 168 hours.  Recent Results (from the past 240 hour(s))  CSF culture w Gram Stain     Status: None (Preliminary result)   Collection Time: 12/06/21  9:35 PM    Specimen: CSF; Cerebrospinal Fluid  Result Value Ref Range Status   Specimen Description CSF  Final   Special Requests TUBE 2 POSSIBLE CJD  Final   Gram Stain   Final    CYTOSPIN SMEAR WBC PRESENT, PREDOMINANTLY MONONUCLEAR NO ORGANISMS SEEN Performed at [facility], [address], [place], [place] [address]    Culture PENDING  Incomplete   Report Status PENDING  Incomplete  Culture, fungus without smear     Status: None (Preliminary result)   Collection Time: 12/06/21  9:35 PM   Specimen: CSF; Cerebrospinal Fluid  Result Value Ref Range Status   Specimen Description CSF  Final   Special Requests POSSIBLE CJD  Final   Culture   Final    NO FUNGUS ISOLATED AFTER 1 DAY Performed at [facility], [place] [address] [place], [place] [address]    Report Status PENDING  Incomplete  MRSA Next Gen by PCR, Nasal     Status: None   Collection Time: 12/06/21 11:25 PM   Specimen: Nasal Mucosa; Nasal Swab  Result Value Ref Range Status   MRSA by PCR Next Gen NOT DETECTED NOT DETECTED Final    Comment: (NOTE) The GeneXpert MRSA Assay (FDA approved  for NASAL specimens only), is one component of a comprehensive MRSA colonization surveillance program. It is not intended to diagnose MRSA infection nor to guide or monitor treatment for MRSA infections. Test performance is not FDA approved in patients less than 59 years old. Performed at [facility], [place] [address], [place], [place] [address]          Radiology Studies: CT HEAD WO CONTRAST (5MM)  Result Date: 12/06/2021 CLINICAL DATA:  Neck trauma (Age >= 65y) unwitnessed fall. Patient sustained an unwitnessed fall. Patient was found on the ground by staff. Patient placed back in bed. Patient moving all extremities. MD notified. EXAM: CT HEAD WITHOUT CONTRAST CT CERVICAL SPINE WITHOUT CONTRAST TECHNIQUE: Multidetector CT imaging of the head and cervical spine was performed following the standard protocol without  intravenous contrast. Multiplanar CT image reconstructions of the cervical spine were also generated. RADIATION DOSE REDUCTION: This exam was performed according to the departmental dose-optimization program which includes automated exposure control, adjustment of the mA and/or kV according to patient size and/or use of iterative reconstruction technique. COMPARISON:  None Available. FINDINGS: CT HEAD FINDINGS Brain: No evidence of large-territorial acute infarction. No parenchymal hemorrhage. No mass lesion. No extra-axial collection. No mass effect or midline shift. No hydrocephalus. Basilar cisterns are patent. Vascular: No hyperdense vessel. Atherosclerotic calcifications are present within the cavernous internal carotid arteries. Skull: No acute fracture or focal lesion. Sinuses/Orbits: Paranasal sinuses and mastoid air cells are clear. The orbits are unremarkable. Other: None. CT CERVICAL SPINE FINDINGS Alignment: Normal. Skull base and vertebrae: No acute fracture. No aggressive appearing focal osseous lesion or focal pathologic process. Soft tissues and spinal canal: No prevertebral fluid or swelling. No visible canal hematoma. Upper chest: Unremarkable. Other: Atherosclerotic plaque of the carotid arteries within the neck. IMPRESSION: 1. No acute intracranial abnormality. 2. No acute displaced fracture or traumatic listhesis of the cervical spine. Electronically Signed   By: Iven Finn M.D.   On: 12/06/2021 19:30   CT CERVICAL SPINE WO CONTRAST  Result Date: 12/06/2021 CLINICAL DATA:  Neck trauma (Age >= 65y) unwitnessed fall. Patient sustained an unwitnessed fall. Patient was found on the ground by staff. Patient placed back in bed. Patient moving all extremities. MD notified. EXAM: CT HEAD WITHOUT CONTRAST CT CERVICAL SPINE WITHOUT CONTRAST TECHNIQUE: Multidetector CT imaging of the head and cervical spine was performed following the standard protocol without intravenous contrast. Multiplanar CT  image reconstructions of the cervical spine were also generated. RADIATION DOSE REDUCTION: This exam was performed according to the departmental dose-optimization program which includes automated exposure control, adjustment of the mA and/or kV according to patient size and/or use of iterative reconstruction technique. COMPARISON:  None Available. FINDINGS: CT HEAD FINDINGS Brain: No evidence of large-territorial acute infarction. No parenchymal hemorrhage. No mass lesion. No extra-axial collection. No mass effect or midline shift. No hydrocephalus. Basilar cisterns are patent. Vascular: No hyperdense vessel. Atherosclerotic calcifications are present within the cavernous internal carotid arteries. Skull: No acute fracture or focal lesion. Sinuses/Orbits: Paranasal sinuses and mastoid air cells are clear. The orbits are unremarkable. Other: None. CT CERVICAL SPINE FINDINGS Alignment: Normal. Skull base and vertebrae: No acute fracture. No aggressive appearing focal osseous lesion or focal pathologic process. Soft tissues and spinal canal: No prevertebral fluid or swelling. No visible canal hematoma. Upper chest: Unremarkable. Other: Atherosclerotic plaque of the carotid arteries within the neck. IMPRESSION: 1. No acute intracranial abnormality. 2. No acute displaced fracture or traumatic listhesis of the cervical spine.  Electronically Signed   By: Iven Finn M.D.   On: 12/06/2021 19:30   DG Pelvis Portable  Result Date: 12/06/2021 CLINICAL DATA:  Fall EXAM: PORTABLE PELVIS 1-2 VIEWS COMPARISON:  CT pelvis from 12/06/2021 FINDINGS: Contrast medium in the urinary bladder from recent CT scan. Chronic loss of intervertebral disc height at L5-S1. No fracture or acute bony findings identified. IMPRESSION: 1. No acute findings. 2. Chronic loss of intervertebral disc height at L5-S1. Electronically Signed   By: Van Clines M.D.   On: 12/06/2021 17:37   CT CHEST ABDOMEN PELVIS W CONTRAST  Result Date:  12/06/2021 CLINICAL DATA:  Prostate cancer. Altered mental status. Encephalitis. Evaluate for occult malignancy. EXAM: CT CHEST, ABDOMEN, AND PELVIS WITH CONTRAST TECHNIQUE: Multidetector CT imaging of the chest, abdomen and pelvis was performed following the standard protocol during bolus administration of intravenous contrast. RADIATION DOSE REDUCTION: This exam was performed according to the departmental dose-optimization program which includes automated exposure control, adjustment of the mA and/or kV according to patient size and/or use of iterative reconstruction technique. CONTRAST:  43m OMNIPAQUE IOHEXOL 350 MG/ML SOLN COMPARISON:  CT scan 04/10/2021 and MRI abdomen 05/14/2021 FINDINGS: CT CHEST FINDINGS Cardiovascular: The heart is normal in size. No pericardial effusion. The aorta is normal in caliber. No dissection or atherosclerotic calcification. No definite coronary artery calcifications. Mediastinum/Nodes: Small scattered mediastinal and hilar lymph nodes but no mass or adenopathy. The esophagus is unremarkable. The thyroid gland is unremarkable. Lungs/Pleura: No acute pulmonary process. No worrisome pulmonary lesions or pulmonary nodules. Patchy bibasilar dependent atelectasis. The central tracheobronchial tree is unremarkable. Musculoskeletal: No significant bony findings. CT ABDOMEN PELVIS FINDINGS Hepatobiliary: Stable small hemangioma in the right hepatic lobe in segment 7. No worrisome hepatic lesions or intrahepatic biliary dilatation. The gallbladder is unremarkable. No common bile duct dilatation. Pancreas: No mass, inflammation or ductal dilatation. Spleen: Normal size. No focal lesions. Adrenals/Urinary Tract: Adrenal glands and kidneys are unremarkable. Stable simple left renal cyst. No further imaging evaluation or follow-up is necessary. The bladder is normal. Stomach/Bowel: The stomach, duodenum, small bowel and colon are unremarkable. No acute inflammatory process, mass lesion or  obstructive findings. Stable scattered colonic diverticulosis. Vascular/Lymphatic: Scattered atherosclerotic calcifications involving the abdominal aorta and iliac arteries but no aneurysm or dissection. The major venous structures are patent. No mesenteric or retroperitoneal mass or adenopathy. Reproductive: Mildly enlarged prostate gland with median lobe hypertrophy impressing on the base of the bladder. The seminal vesicles are unremarkable. Other: No pelvic mass or adenopathy. No free pelvic fluid collections. No inguinal mass or adenopathy. No abdominal wall hernia or subcutaneous lesions. Musculoskeletal: No significant bony findings. No lytic or sclerotic bone lesions. IMPRESSION: 1. No acute abdominal/pelvic findings, mass lesion or adenopathy. 2. Stable small right hepatic lobe hemangioma. 3. Stable simple left renal cyst. 4. Mildly enlarged prostate gland with median lobe hypertrophy impressing on the base of the bladder. Electronically Signed   By: PMarijo SanesM.D.   On: 12/06/2021 10:45   Overnight EEG with video  Result Date: 12/06/2021 YLora Havens MD     12/07/2021  9:23 AM Patient Name: JANUBIS FUNDORAMRN: [id number] Attending: PLora HavensReferring Physician/Provider: BLorenza Chick MD Duration: 12/06/2021 0526 to 12/07/2021 0526 Patient history: 89yoM with ams. EEG to evaluate for seizure Level of alertness: Awake, asleep AEDs during EEG study: None Technical aspects: This EEG study was done with scalp electrodes positioned according to the 10-20 International system of electrode placement. Electrical activity was  reviewed with band pass filter of 1-'70Hz'$ , sensitivity of 7 uV/mm, display speed of 37m/sec with a '60Hz'$  notched filter applied as appropriate. EEG data were recorded continuously and digitally stored.  Video monitoring was available and reviewed as appropriate. Description: The posterior dominant rhythm consists of 9 Hz activity of moderate voltage (25-35  uV) seen predominantly in posterior head regions, symmetric and reactive to eye opening and eye closing. Sleep was characterized by sleep spindles (12-'14hz'$ ), maximal fronto-central region.EEG also showed near continuous 3 to 6 Hz theta-delta slowing in right fronto-temporal region which at times appeared sharply contoured. Hyperventilation and photic stimulation were not performed.   ABNORMALITY - Continuous slow, right fronto-temporal region IMPRESSION: This study is suggestive of cortical dysfunction arising from right fronto-temporal region likely secondary to underlying structural abnormality, post-ictal state. No seizures or epileptiform discharges were seen throughout the recording. PLora Havens  MR BRAIN WO CONTRAST  Result Date: 12/05/2021 CLINICAL DATA:  Altered mental status, stroke suspected EXAM: MRI HEAD WITHOUT CONTRAST TECHNIQUE: Multiplanar, multiecho pulse sequences of the brain and surrounding structures were obtained without intravenous contrast. COMPARISON:  None Available. FINDINGS: Brain: Cortical restricted diffusion with ADC correlate in the right cerebral hemisphere (series 5, image 73, 85, and 90 for example). Possible involvement in the posterior left frontal lobe (series 7, image 53). These areas are associated with mildly increased T2 hyperintense signal but no cortical swelling. Mildly increased signal on diffusion-weighted imaging in the bilateral caudate heads (series 5, image 83) and, to a lesser extent, the anterior putamen. No acute hemorrhage, mass, mass effect, or midline shift. No hydrocephalus or extra-axial collection. Cerebral volume is within normal limits for age. Scattered T2 hyperintense signal in the periventricular white matter, likely the sequela of mild chronic small vessel ischemic disease. Vascular: Normal arterial flow voids. Skull and upper cervical spine: Normal marrow signal. Sinuses/Orbits: Mucosal thickening in the ethmoid air cells. No acute finding  in the orbits. Other: The mastoids are well aerated. IMPRESSION: Cortical restricted diffusion primarily in the right cerebral hemisphere, bilateral caudate heads, and anterior putamina, which are nonspecific but concerning for Creutzfeldt-Jakob disease. The differential includes other encephalopathies, such as autoimmune encephalitis or hepatic encephalopathy. A lumbar puncture is recommended. Electronically Signed   By: AMerilyn BabaM.D.   On: 12/05/2021 23:50        Scheduled Meds:  busPIRone  15 mg Oral BID   etomidate  8 mg Intravenous Once   pantoprazole  40 mg Oral Daily   rivaroxaban  20 mg Oral Daily   sodium chloride flush  3 mL Intravenous Q12H   Continuous Infusions:  sodium chloride     sodium chloride 50 mL/hr at 12/07/21 1655   methylPREDNISolone (SOLU-MEDROL) injection 1,000 mg (12/07/21 1048)     LOS: 1 day    Time spent: 344ms    JeKathie DikeMD [facility]   If 7PM-7AM, please contact night-coverage [email or web address]  12/07/2021, 6:54 PM

## 2021-12-07 NOTE — Progress Notes (Signed)
ANTICOAGULATION CONSULT NOTE - Initial Consult  Pharmacy Consult for heparin Indication:  APLS w/ h/o VTE  Allergies  Allergen Reactions   Diphenhydramine     Other reaction(s): prostate swelling, increase urination   Penicillins Other (See Comments)    "childhood reaction from mother"   Trazodone Hcl     Other reaction(s): weak stream    Patient Measurements: Height: '6\' 5"'$  (195.6 cm) Weight: 81.3 kg (179 lb 3.7 oz) IBW/kg (Calculated) : 89.1  Vital Signs: Temp: 98.1 F (36.7 C) (11/12 1943) Temp Source: Oral (11/12 1943) BP: 129/76 (11/12 1518) Pulse Rate: 78 (11/12 1518)  Labs: Recent Labs    12/05/21 1527  HGB 13.8  HCT 41.1  PLT 205  CREATININE 1.03    Estimated Creatinine Clearance: 78.9 mL/min (by C-G formula based on SCr of 1.03 mg/dL).   Medical History: Past Medical History:  Diagnosis Date   Hypercholesteremia    Pulmonary embolism (HCC)     Medications:  Medications Prior to Admission  Medication Sig Dispense Refill Last Dose   ascorbic acid (VITAMIN C) 500 MG tablet Take 1,000 mg by mouth daily.   Past Week   bismuth subsalicylate (PEPTO BISMOL) 262 MG/15ML suspension Take 30 mLs by mouth every 6 (six) hours as needed for diarrhea or loose stools.   Past Month   busPIRone (BUSPAR) 7.5 MG tablet Take 15 mg by mouth 2 (two) times daily as needed (anxiety).   12/05/2021   Calcium-Magnesium-Vitamin D (CALCIUM MAGNESIUM PO) Take 5 mLs by mouth daily. Oral liquid   Past Week   Cholecalciferol (VITAMIN D3) 125 MCG (5000 UT) TABS Take 1 tablet by mouth daily.   12/05/2021   IVERMECTIN PO Take 18 mg by mouth daily. Ivermectin oil '10mg'$ /ml suspension.   12/05/2021 at 0900   predniSONE (DELTASONE) 5 MG tablet Take 5-15 mg by mouth See admin instructions. Take 15 mg for 5 days, then take 10 mg for 5 days, and then take 5 mg for 5 days and then stop.   12/05/2021   Rivaroxaban (XARELTO) 20 MG TABS tablet Take 1 tablet (20 mg total) by mouth daily with supper.  Start taking March 29th, 2015 (Patient taking differently: Take 20 mg by mouth daily with supper.) 30 tablet 3 12/04/2021 at 1930   traZODone (DESYREL) 50 MG tablet Take 25 mg by mouth at bedtime as needed for sleep.      zinc gluconate 50 MG tablet Take 50 mg by mouth daily.   12/05/2021   benzonatate (TESSALON) 200 MG capsule Take 200 mg by mouth 3 (three) times daily as needed. (Patient not taking: Reported on 12/06/2021)   Not Taking   dexamethasone (DECADRON) 6 MG tablet Take 6 mg by mouth once. (Patient not taking: Reported on 12/06/2021)   Not Taking   Multiple Vitamin (MULTIVITAMIN WITH MINERALS) TABS tablet Take 1 tablet by mouth daily. (Patient not taking: Reported on 12/06/2021)   Not Taking   omeprazole (PRILOSEC) 20 MG capsule Take 20 mg by mouth daily. (Patient not taking: Reported on 12/06/2021)   Not Taking   saw palmetto 160 MG capsule Take 160 mg by mouth daily. (Patient not taking: Reported on 12/06/2021)   Not Taking   Scheduled:   busPIRone  15 mg Oral BID   etomidate  8 mg Intravenous Once   [START ON 12/08/2021] insulin aspart  0-15 Units Subcutaneous TID WC   pantoprazole  40 mg Oral Daily   sodium chloride flush  3 mL Intravenous Q12H  Infusions:   sodium chloride     sodium chloride 50 mL/hr at 12/07/21 1655   methylPREDNISolone (SOLU-MEDROL) injection 1,000 mg (12/07/21 1048)    Assessment: 71yo male admitted w/ AMS after Covid Dx, now to start heparin after pt refused PO Xarelto for APLS w/ h/o VTE; last dose of Xarelto taken 11/9.  Goal of Therapy:  Heparin level 0.3-0.7 units/ml aPTT 66-102 seconds Monitor platelets by anticoagulation protocol: Yes   Plan:  Heparin 1300 units/hr. Monitor heparin levels, aPTT (while Xarelto affects anti-Xa assay), and CBC.  Wynona Neat, PharmD, BCPS  12/07/2021,11:13 PM

## 2021-12-07 NOTE — Progress Notes (Signed)
Neurology Progress Note   Subjective: -Complaining of some headache in the back right side of the head  Exam: Vitals:   12/07/21 1518 12/07/21 1943  BP: 129/76   Pulse: 78   Resp: 20 12  Temp: 98.2 F (36.8 C)   SpO2: 94% 96%   Gen: In bed, comfortable  Resp: non-labored breathing, no grossly audible wheezing Cardiac: Perfusing extremities well  Abd: soft, nt Head: Slightly tender to palpation of the occipital nerve on the right side.  Headache does not improve with laying the patient flat  Neuro: MS: Still having some aphasia plus minus motor apraxia but markedly improved from my evaluation last night.  For example on finger-nose testing he will touch his nose but seems uncertain about touching my finger and cannot figure out what I am asking him to do.  However he is texting a neighbor on his phone about neighborhood board related issues.  He is oriented to age, year, month, [employer], does not recall meeting me before, reports he is here because of [place].  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  On review of the literature, there is some data that high-dose steroids is the most effective treatment.  I did consider adding IVIG, but holding off right now due to improvement as this treatment would not be without risk, especially due to his history of blood clotting issues.  With the anticipation that we may pursue either plasma exchange or IVIG if he is not improving tomorrow, I have additionally sent out a broad range of  autoimmune labs as below to help guide future management, noting that my previously ordered serum autoimmune encephalitis panel was not collected  LGI1 antibody encephalitis: acute treatment comparisons and outcome  [email or web address]   Recommendations:  # c/f LGI-1 encephalitis due to clinical appearance of faciobrachial dystonic seizures - Discontinued acyclovir due to negative M/E panel - Pulse dose steroids, continue 11/11 - 11/15 - May add IVIG vs PLEX if not improving, - D/C LTM EEG  Workup summary: Negative CTA chest/abdomen/pelvis with contrast for malignancy screening  LTM negative for seizures  CSF studies:    WBC 0/0, RBC 0/0, protein and glucose pending due to concern for CJD M/E panel negative (cryptococcus, CMV, enterovirus, initiation coli K1, H. influenzae, HSV-1, HSV-2, HHV-6, human per echovirus, Listeria, Neisseria, Streptococcus agalactiae and pneumoniae, VZV)  Gram stain negative Bacterial culture, fungal culture pending  Pending: Beta Isoform Creutzfeldt Jacob disease, 14 3 3, RT quic   Oligoclonal bands, IgG index   Autoimmune encephalitis panel on CSF   Lyme antigen CSF  Serum studies:   TSH normal, ammonia normal, B12 426,  Pending: MMA in process, B1 in process,   HIV negative, RPR negative   Serum autoimmune encephalitis panel ordered but not collected, reordering   Lyme antigen serum   ANA, Anti-dsDNA, anti-smith, SSA/SSB   Serum paraneoplastic panel  Video examples of FBDS shared with bedside nurse and sitter overnight on 11/12, I have asked them to monitor frequency of these events   [email or web address]    #  Risk of prandial hyperglycemia - SSI (start with moderate scale given thin patient, increase to resistant scale if needed) - Continue pantoprazole  # Hx of DVT / APLS  - Patient refused Xarelto Per chart documentation, will start heparin drip in case line is needed for  plasma exchange  Neurology will continue to follow, thank you for involving [place] in the care of this patient  Lesleigh Noe MD-PhD [facility] [phone]  Available 7 AM to 7 PM, outside these hours please contact Neurologist on call listed on AMION

## 2021-12-07 NOTE — Plan of Care (Signed)
  Problem: Clinical Measurements: Goal: Will remain free from infection Outcome: Progressing Goal: Cardiovascular complication will be avoided Outcome: Progressing   Problem: Coping: Goal: Level of anxiety will decrease Outcome: Progressing   

## 2021-12-07 NOTE — Procedures (Signed)
Patient Name: Spencer Ross  MRN: [id number]  Epilepsy Attending: Lora Havens  Referring Physician/Provider: Lorenza Chick, MD  Duration: 12/07/2021 0526 to 12/07/2021 2323   Patient history: 71yo M with ams. EEG to evaluate for seizure   Level of alertness: Awake, asleep   AEDs during EEG study: None   Technical aspects: This EEG study was done with scalp electrodes positioned according to the 10-20 International system of electrode placement. Electrical activity was reviewed with band pass filter of 1-'70Hz'$ , sensitivity of 7 uV/mm, display speed of 52m/sec with a '60Hz'$  notched filter applied as appropriate. EEG data were recorded continuously and digitally stored.  Video monitoring was available and reviewed as appropriate.   Description: The posterior dominant rhythm consists of 9 Hz activity of moderate voltage (25-35 uV) seen predominantly in posterior head regions, symmetric and reactive to eye opening and eye closing. Sleep was characterized by sleep spindles (12-'14hz'$ ), maximal fronto-central region. EEG also showed near continuous 3 to 6 Hz theta-delta slowing in right fronto-temporal region which at times appeared sharply contoured. Hyperventilation and photic stimulation were not performed.      ABNORMALITY - Continuous slow, right fronto-temporal region    IMPRESSION: This study is suggestive of cortical dysfunction arising from right fronto-temporal region likely secondary to underlying structural abnormality, post-ictal state. No seizures or definite epileptiform discharges were seen throughout the recording.   Charbel Los OBarbra Sarks

## 2021-12-07 NOTE — Progress Notes (Signed)
LTM EEG disconnected - no skin breakdown at unhook. [facility] notified.   

## 2021-12-08 DIAGNOSIS — G0490 Encephalitis and encephalomyelitis, unspecified: Secondary | ICD-10-CM

## 2021-12-08 DIAGNOSIS — D6861 Antiphospholipid syndrome: Secondary | ICD-10-CM | POA: Diagnosis not present

## 2021-12-08 DIAGNOSIS — N400 Enlarged prostate without lower urinary tract symptoms: Secondary | ICD-10-CM | POA: Diagnosis not present

## 2021-12-08 DIAGNOSIS — G9340 Encephalopathy, unspecified: Secondary | ICD-10-CM | POA: Diagnosis not present

## 2021-12-08 DIAGNOSIS — I2699 Other pulmonary embolism without acute cor pulmonale: Secondary | ICD-10-CM | POA: Diagnosis not present

## 2021-12-08 LAB — MAGNESIUM: Magnesium: 2 mg/dL (ref 1.7–2.4)

## 2021-12-08 LAB — COMPREHENSIVE METABOLIC PANEL
ALT: 15 U/L (ref 0–44)
AST: 23 U/L (ref 15–41)
Albumin: 3.7 g/dL (ref 3.5–5.0)
Alkaline Phosphatase: 46 U/L (ref 38–126)
Anion gap: 10 (ref 5–15)
BUN: 21 mg/dL (ref 8–23)
CO2: 22 mmol/L (ref 22–32)
Calcium: 9.2 mg/dL (ref 8.9–10.3)
Chloride: 111 mmol/L (ref 98–111)
Creatinine, Ser: 0.87 mg/dL (ref 0.61–1.24)
GFR, Estimated: >60 mL/min (ref >60)
Glucose, Bld: 94 mg/dL (ref 70–99)
Potassium: 3.2 mmol/L — ABNORMAL LOW (ref 3.5–5.1)
Sodium: 143 mmol/L (ref 135–145)
Total Bilirubin: 0.7 mg/dL (ref 0.3–1.2)
Total Protein: 6.6 g/dL (ref 6.5–8.1)

## 2021-12-08 LAB — CBC
HCT: 41.1 % (ref 39.0–52.0)
Hemoglobin: 13.7 g/dL (ref 13.0–17.0)
MCH: 30.3 pg (ref 26.0–34.0)
MCHC: 33.3 g/dL (ref 30.0–36.0)
MCV: 90.9 fL (ref 80.0–100.0)
Platelets: 235 10*3/uL (ref 150–400)
RBC: 4.52 MIL/uL (ref 4.22–5.81)
RDW: 13 % (ref 11.5–15.5)
WBC: 13.7 10*3/uL — ABNORMAL HIGH (ref 4.0–10.5)
nRBC: 0 % (ref 0.0–0.2)

## 2021-12-08 LAB — GLUCOSE, CAPILLARY
Glucose-Capillary: 118 mg/dL — ABNORMAL HIGH (ref 70–99)
Glucose-Capillary: 116 mg/dL — ABNORMAL HIGH (ref 70–99)
Glucose-Capillary: 123 mg/dL — ABNORMAL HIGH (ref 70–99)

## 2021-12-08 LAB — HEPARIN LEVEL (UNFRACTIONATED): Heparin Unfractionated: 0.83 IU/mL — ABNORMAL HIGH (ref 0.30–0.70)

## 2021-12-08 LAB — APTT
aPTT: 72 seconds — ABNORMAL HIGH (ref 24–36)
aPTT: 79 seconds — ABNORMAL HIGH (ref 24–36)

## 2021-12-08 LAB — BLOOD GAS, ARTERIAL
Acid-Base Excess: 0.3 mmol/L (ref 0.0–2.0)
Bicarbonate: 23.3 mmol/L (ref 20.0–28.0)
Drawn by: 53527
O2 Saturation: 96.2 %
Patient temperature: 37.3
pCO2 arterial: 32 mmHg (ref 32–48)
pH, Arterial: 7.47 — ABNORMAL HIGH (ref 7.35–7.45)
pO2, Arterial: 76 mmHg — ABNORMAL LOW (ref 83–108)

## 2021-12-08 LAB — HEMOGLOBIN A1C
Hgb A1c MFr Bld: 5.5 % (ref 4.8–5.6)
Mean Plasma Glucose: 111.15 mg/dL

## 2021-12-08 LAB — PHOSPHORUS: Phosphorus: 3.5 mg/dL (ref 2.5–4.6)

## 2021-12-08 MED ORDER — ZIPRASIDONE MESYLATE 20 MG IM SOLR
20.0000 mg | Freq: Once | INTRAMUSCULAR | Status: DC
  Filled 2021-12-08: qty 20

## 2021-12-08 MED ORDER — DIVALPROEX SODIUM ER 500 MG PO TB24
500.0000 mg | ORAL_TABLET | Freq: Every day | ORAL | Status: DC
  Filled 2021-12-08 (×3): qty 1

## 2021-12-08 MED ORDER — POTASSIUM CHLORIDE IN NACL 40-0.9 MEQ/L-% IV SOLN

## 2021-12-08 MED ORDER — LORAZEPAM 2 MG/ML IJ SOLN
1.0000 mg | INTRAMUSCULAR | Status: DC | PRN
  Administered 2021-12-08 – 2021-12-10 (×3): 2 mg via INTRAVENOUS
  Filled 2021-12-08 (×4): qty 1

## 2021-12-08 MED ORDER — VALPROATE SODIUM 100 MG/ML IV SOLN
500.0000 mg | Freq: Once | INTRAVENOUS | Status: AC
  Administered 2021-12-09: 500 mg via INTRAVENOUS
  Filled 2021-12-08: qty 5

## 2021-12-08 NOTE — Progress Notes (Signed)
Mobility Specialist Progress Note    12/08/21 1229  Mobility  Activity Contraindicated/medical hold   Will f/u when appropriate.   Hildred Alamin Mobility Specialist  Please Psychologist, sport and exercise or Rehab Office at [phone]

## 2021-12-08 NOTE — Progress Notes (Addendum)
Paged MD Memon at 1350 d/t pt pulling Iv out and needing IV ativan. Orders to give IM Geodon until we can get another IV access.  IV access established before Geodon arrived from pharmacy, IV ativan give instead.

## 2021-12-08 NOTE — Progress Notes (Addendum)
Bajandas for heparin Indication:  APLS w/ h/o VTE  Allergies  Allergen Reactions   Diphenhydramine     Other reaction(s): prostate swelling, increase urination   Penicillins Other (See Comments)    "childhood reaction from mother"   Trazodone Hcl     Other reaction(s): weak stream    Patient Measurements: Height: '6\' 5"'$  (195.6 cm) Weight: 81.8 kg (180 lb 5.4 oz) IBW/kg (Calculated) : 89.1  Vital Signs: Temp: 98.8 F (37.1 C) (11/13 1137) Temp Source: Oral (11/13 1137) BP: 150/86 (11/13 1137) Pulse Rate: 86 (11/13 1137)  Labs: Recent Labs    12/05/21 1527 12/08/21 1021  HGB 13.8 13.7  HCT 41.1 41.1  PLT 205 235  APTT  --  72*  HEPARINUNFRC  --  0.83*  CREATININE 1.03  --      Estimated Creatinine Clearance: 79.4 mL/min (by C-G formula based on SCr of 1.03 mg/dL).   Medical History: Past Medical History:  Diagnosis Date   Hypercholesteremia    Pulmonary embolism (HCC)     Medications:  Medications Prior to Admission  Medication Sig Dispense Refill Last Dose   ascorbic acid (VITAMIN C) 500 MG tablet Take 1,000 mg by mouth daily.   Past Week   bismuth subsalicylate (PEPTO BISMOL) 262 MG/15ML suspension Take 30 mLs by mouth every 6 (six) hours as needed for diarrhea or loose stools.   Past Month   busPIRone (BUSPAR) 7.5 MG tablet Take 15 mg by mouth 2 (two) times daily as needed (anxiety).   12/05/2021   Calcium-Magnesium-Vitamin D (CALCIUM MAGNESIUM PO) Take 5 mLs by mouth daily. Oral liquid   Past Week   Cholecalciferol (VITAMIN D3) 125 MCG (5000 UT) TABS Take 1 tablet by mouth daily.   12/05/2021   IVERMECTIN PO Take 18 mg by mouth daily. Ivermectin oil '10mg'$ /ml suspension.   12/05/2021 at 0900   predniSONE (DELTASONE) 5 MG tablet Take 5-15 mg by mouth See admin instructions. Take 15 mg for 5 days, then take 10 mg for 5 days, and then take 5 mg for 5 days and then stop.   12/05/2021   Rivaroxaban (XARELTO) 20 MG TABS  tablet Take 1 tablet (20 mg total) by mouth daily with supper. Start taking March 29th, 2015 (Patient taking differently: Take 20 mg by mouth daily with supper.) 30 tablet 3 12/04/2021 at 1930   traZODone (DESYREL) 50 MG tablet Take 25 mg by mouth at bedtime as needed for sleep.      zinc gluconate 50 MG tablet Take 50 mg by mouth daily.   12/05/2021   benzonatate (TESSALON) 200 MG capsule Take 200 mg by mouth 3 (three) times daily as needed. (Patient not taking: Reported on 12/06/2021)   Not Taking   dexamethasone (DECADRON) 6 MG tablet Take 6 mg by mouth once. (Patient not taking: Reported on 12/06/2021)   Not Taking   Multiple Vitamin (MULTIVITAMIN WITH MINERALS) TABS tablet Take 1 tablet by mouth daily. (Patient not taking: Reported on 12/06/2021)   Not Taking   omeprazole (PRILOSEC) 20 MG capsule Take 20 mg by mouth daily. (Patient not taking: Reported on 12/06/2021)   Not Taking   saw palmetto 160 MG capsule Take 160 mg by mouth daily. (Patient not taking: Reported on 12/06/2021)   Not Taking   Scheduled:   busPIRone  15 mg Oral BID   insulin aspart  0-15 Units Subcutaneous TID WC   pantoprazole  40 mg Oral Daily   sodium chloride  flush  3 mL Intravenous Q12H   Infusions:   sodium chloride     sodium chloride 50 mL/hr at 12/08/21 0721   heparin 1,300 Units/hr (12/08/21 0310)   methylPREDNISolone (SOLU-MEDROL) injection 1,000 mg (12/08/21 0911)    Assessment: 71yo male admitted w/ AMS after Covid Dx, now to start heparin after pt refused PO Xarelto for APLS w/ h/o VTE; last dose of Xarelto taken 11/9.  Heparin level remains still falsely elevated due to recent DOAC at 0.83, aPTT came back therapeutic at 72, on heparin infusion at 1300 units/hr. CBC stable. No s/sx of bleeding or infusion issues.   Goal of Therapy:  Heparin level 0.3-0.7 units/ml aPTT 66-102 seconds Monitor platelets by anticoagulation protocol: Yes   Plan:  Continue heparin infusion at 1300 units/hr. Will  confirm level in 6 hours Monitor heparin levels, aPTT (while Xarelto affects anti-Xa assay), and CBC.  Antonietta Jewel, PharmD, BCCCP Clinical Pharmacist  Phone: [phone] 12/08/2021 12:36 PM  Please check AMION for all [place] phone numbers After 10:00 PM, call [place] [phone]  Confirm aPTT came back therapeutic tonight. Cont current rate and check in AM.  Onnie Boer, PharmD, BCIDP, AAHIVP, CPP Infectious Disease Pharmacist 12/08/2021 9:03 PM

## 2021-12-08 NOTE — TOC Progression Note (Signed)
Transition of Care [facility]) - Progression Note    Patient Details  Name: Spencer Ross MRN: [id number] Date of Birth: Apr 25, 1950  Transition of Care [facility]) CM/SW Contact  Zenon Mayo, RN Phone Number: 12/08/2021, 5:07 PM  Clinical Narrative:    enceph, in restraints, from home with wife, iv abx, hep drip. TOC following.        Expected Discharge Plan and Services                                                 Social Determinants of Health (SDOH) Interventions    Readmission Risk Interventions     No data to display

## 2021-12-08 NOTE — Progress Notes (Signed)
RT NOTE: RT obtained ABG. ABG sent and lab called '@[id number]'$ . RT will continue to monitor as needed.

## 2021-12-08 NOTE — Progress Notes (Addendum)
RN called due to patient being combative and required being held down by police.  IV Haldol 2 mg was given without any improvement, at bedside, patient continues to be a risk to injure himself and staff, nonviolent restraint was started, but this did not restrain patient.  Another 2 mg of Haldol was given, violent restraint was started.  We shall continue to monitor patient and de-escalate restraints as patient's behavior improves.

## 2021-12-08 NOTE — Progress Notes (Signed)
Pt does not follow command and he  will raise his legs up to the staff. He is strong to grab medical lines as well as grabbing the staff hands that impedes care.   Ativan is given to provide bed change and care. Attempted PIV insert

## 2021-12-08 NOTE — Progress Notes (Signed)
Neurology Progress Note   Subjective: Wife and daughter are at the bedside. Safety sitter at the bedside. Patient is in bilateral restraints, mitts and lap belt, very agitated and restless. Patient was able to tell us his name otherwise did not answer any other orientation questions.   Exam: Vitals:   12/08/21 1137 12/08/21 1727  BP: (!) 150/86 (!) 152/82  Pulse: 86 80  Resp: 20 18  Temp: 98.8 F (37.1 C) 99.1 F (37.3 C)  SpO2: 96%    Gen: In bed, comfortable  Resp: non-labored breathing Cardiac: Perfusing extremities well  Abd: soft, nt  Neuro: MS: He is confused, agitated and restless. Able to state his name, unable to answer any other orientation questions. Poor concentration. 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  On review of the literature, there is some data that high-dose steroids is the most effective treatment.  There is also some evidence of using another modality for immune suppression such as plasma exchange or IVIG.  Given his factor V Leiden history, would not use IVIG.  Workup summary: Negative CTA chest/abdomen/pelvis with contrast for malignancy screening  LTM negative for seizures  CSF studies:    WBC 0/0, RBC 0/0, protein and glucose pending due to concern for CJD M/E panel negative (cryptococcus, CMV, enterovirus, initiation coli K1, H. influenzae, HSV-1, HSV-2, HHV-6, human per echovirus, Listeria, Neisseria, Streptococcus agalactiae and pneumoniae, VZV)  Gram stain negative Bacterial culture, fungal culture pending  Pending: Beta Isoform Wynelle Cleveland disease, 14 3 3, RT quic   Oligoclonal bands, IgG index   Autoimmune encephalitis panel on CSF   Lyme antigen CSF  Serum studies:   TSH normal, ammonia  normal, B12 426,  Pending: MMA in process, B1 in process   HIV negative, RPR negative  Recommendations: - Pulse dose steroids, continue 11/11 - 11/15 - Monitor BG while on steroids  - Continue PPI while on steroids  - Will consider Plasma exchange in the next day or so if no clinical improvement seen   Beulah Gandy DNP, ACNPC-AG    Attending Neurohospitalist Addendum Patient seen and examined with APP/Resident. Agree with the history and physical as documented above. Agree with the plan as documented, which I helped formulate. I have independently reviewed the chart, obtained history, review of systems and examined the patient.I have personally reviewed pertinent head/neck/spine imaging (CT/MRI).  Plan discussed with Dr. Roderic Palau Plan also discussed with the patient's wife and daughter at bedside and all questions answered. Please feel free to call with any questions.  -- Amie Portland, MD Neurologist [facility] Pager: [phone]

## 2021-12-08 NOTE — Progress Notes (Signed)
PROGRESS NOTE    Spencer Ross  VZC:[id number] DOB: [birth year] DOA: 12/05/2021 PCP: Lujean Amel, MD    Brief Narrative:  71 year old male with a history of antiphospholipid antibody, pulmonary embolism in the past on chronic anticoagulation, recently had COVID-19 infection approximately 3 weeks ago.  His wife reported that he had progressive confusion over the past few weeks.  He was brought to the hospital for evaluation where MRI showed cortical restricted diffusion primarily in the right cerebral hemisphere, bilateral caudate heads and anterior putamina.  Neurology following.  He underwent lumbar puncture with several CSF studies in process.  Infectious meningitis/encephalitis panel noted to be negative.  He was started on high-dose IV steroids.  Overall mental status appears to be waxing and waning.   Assessment & Plan:   Principal Problem:   Acute encephalopathy Active Problems:   Pulmonary emboli (HCC)   Antiphospholipid syndrome (HCC)   GERD (gastroesophageal reflux disease)   BPH (benign prostatic hyperplasia)    Acute encephalopathy -Patient's wife describes progressive confusion and agitation over the past few weeks since he was diagnosed with COVID -Initial CT head was unremarkable for intracranial process -MRI brain shows cortical restricted diffusion primarily in the right cerebral hemisphere, bilateral caudate heads and anterior putamina. -Neurology following -MRI of the brain with contrast has been ordered -Continuous EEG did not show any epileptiform discharges, discontinued -Lumbar puncture performed on 11/11.  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 -would defer to neurology if IVIG or plasma exchange is indicated -Await further results from lumbar puncture and input from neurology  Work up resulted: -infectious meningitis/encephalitis panel of CSF negative, cryptococcal antigen negative, HIV negative, TSH normal, ammonia normal, B12 426, RPR negative  Work up pending: -IgG CSF, Oligoclonal bands CSF, Protein/glucose CSF, Beta Isoform (CJD) CSF, ANA, anti DS DNA, Sjogren antibodies, anti Smith ab, Thyroid ab, VDRL CSF, bacterial and fungal cultures CSF,    Recent COVID-19 infection -First diagnosed approximately 3 weeks ago -He has been vaccinated -He was taking steroids as well as ivermectin prior to admission   Antiphospholipid antibody syndrome -On chronic anticoagulation -Xarelto was on hold for lumbar puncture -Currently on heparin infusion   History of pulmonary embolus -Patient had PE approximately 8 years ago -With underlying antiphospholipid antibody, recommendations are for lifelong anticoagulation -He is anticoagulated with heparin infusion -Resume Xarelto when able to consistently take p.o. and no further procedures are planned   Anxiety -Continue on home dose of BuSpar   DVT prophylaxis: Heparin infusion  Code Status: Full code Family Communication: Updated patient's wife Disposition Plan: Status is: Inpatient Remains inpatient appropriate because: Continued work-up of encephalopathy     Consultants:  Neurology  Procedures:  Lumbar puncture 11/11  Antimicrobials:      Subjective: His wife reports that he had a good day yesterday.  Met with several family members.  She noted that he did not have any rest.  Became agitated overnight.  Overnight records show that he was trying to get out of bed and had to be held down by security.  Restraints were applied overnight.  This morning, he wakes up to voice.  Knows he is in the hospital and the date,  but speech is very dysarthric and  he falls back asleep.  Objective: Vitals:   12/08/21 0400 12/08/21 0441 12/08/21 0541 12/08/21 0723  BP: 123/69 117/73 117/73 (!) 158/83  Pulse: 60 (!) 54 60 (!) 105  Resp: _0 Temp:  98 F (36.7 C) 97.6 F (36.4 C) 98.1 F (36.7 C)  TempSrc:  Axillary Axillary Axillary  SpO2: 92%     Weight:      Height:        Intake/Output Summary (Last 24 hours) at 12/08/2021 1146 Last data filed at 12/08/2021 0340 Gross per 24 hour  Intake 976.88 ml  Output 1350 ml  Net -373.12 ml   Filed Weights   12/05/21 1452 12/06/21 2217 12/08/21 0214  Weight: 80.7 kg 81.3 kg 81.8 kg    Examination:  General exam: Sleeping on my arrival, wakes up to voice.  Currently in restraints Respiratory system: Clear to auscultation. Respiratory effort normal. Cardiovascular system: S1 & S2 heard, RRR. No JVD, murmurs, rubs, gallops or clicks. No pedal edema. Gastrointestinal system: Abdomen is nondistended, soft and nontender. No organomegaly or masses felt. Normal bowel sounds heard. Central nervous system: Speech is dysarthric.  Pupils are equal and reactive bilaterally, moving all 4 extremities spontaneously Extremities: Symmetric 5 x 5 power. Skin: No rashes, lesions or ulcers Psychiatry: Patient was noted to be agitated overnight.  Currently, he is in restraints.  Speech is difficult to comprehend, but he knows he is in the hospital, he says is 2023.  Cannot answer further questions.    Data Reviewed: I have personally reviewed following labs and imaging studies  CBC: Recent Labs  Lab 12/05/21 1527  WBC 5.1  HGB 13.8  HCT 41.1  MCV 90.7  PLT 224   Basic Metabolic Panel: Recent Labs  Lab 12/05/21 1527  NA 141  K 3.7  CL 107  CO2 24  GLUCOSE 113*  BUN 17  CREATININE 1.03  CALCIUM 9.5   GFR: Estimated Creatinine Clearance: 79.4 mL/min (by C-G formula based on SCr of 1.03 mg/dL). Liver Function Tests: Recent Labs  Lab 12/05/21 1527  AST 25  ALT 17  ALKPHOS 48   BILITOT 0.8  PROT 6.7  ALBUMIN 3.9   No results for input(s): "LIPASE", "AMYLASE" in the last 168 hours. Recent Labs  Lab 12/06/21 2307  AMMONIA 11   Coagulation Profile: No results for input(s): "INR", "PROTIME" in the last 168 hours. Cardiac Enzymes: No results for input(s): "CKTOTAL", "CKMB", "CKMBINDEX", "TROPONINI" in the last 168 hours. BNP (last 3 results) No results for input(s): "PROBNP" in the last 8760 hours. HbA1C: No results for input(s): "HGBA1C" in the last 72 hours. CBG: Recent Labs  Lab 12/06/21 2214 12/08/21 0210 12/08/21 1139  GLUCAP 139* 118* 116*   Lipid Profile: No results for input(s): "CHOL", "HDL", "LDLCALC", "TRIG", "CHOLHDL", "LDLDIRECT" in the last 72 hours. Thyroid Function Tests: Recent Labs    12/06/21 2307  TSH 0.906   Anemia Panel: Recent Labs    12/06/21 2307  VITAMINB12 426   Sepsis Labs: No results for input(s): "PROCALCITON", "LATICACIDVEN" in the last 168 hours.  Recent Results (from the past 240 hour(s))  CSF culture w Gram Stain     Status: None (Preliminary result)   Collection Time: 12/06/21  9:35 PM   Specimen: CSF; Cerebrospinal Fluid  Result Value Ref Range Status   Specimen Description CSF  Final   Special Requests TUBE 2 POSSIBLE CJD  Final   Gram Stain  Final    CYTOSPIN SMEAR WBC PRESENT, PREDOMINANTLY MONONUCLEAR NO ORGANISMS SEEN    Culture   Final    NO GROWTH 2 DAYS Performed at [facility] [address], [place], [place] [address]    Report Status PENDING  Incomplete  Culture, fungus without smear     Status: None (Preliminary result)   Collection Time: 12/06/21  9:35 PM   Specimen: CSF; Cerebrospinal Fluid  Result Value Ref Range Status   Specimen Description CSF  Final   Special Requests POSSIBLE CJD  Final   Culture   Final    NO FUNGUS ISOLATED AFTER 1 DAY Performed at [facility], [place] [address] [place], [place] [address]    Report Status PENDING  Incomplete  MRSA Next  Gen by PCR, Nasal     Status: None   Collection Time: 12/06/21 11:25 PM   Specimen: Nasal Mucosa; Nasal Swab  Result Value Ref Range Status   MRSA by PCR Next Gen NOT DETECTED NOT DETECTED Final    Comment: (NOTE) The GeneXpert MRSA Assay (FDA approved for NASAL specimens only), is one component of a comprehensive MRSA colonization surveillance program. It is not intended to diagnose MRSA infection nor to guide or monitor treatment for MRSA infections. Test performance is not FDA approved in patients less than 64 years old. Performed at [facility], [place] [address], [place], [place] [address]          Radiology Studies: CT HEAD WO CONTRAST (5MM)  Result Date: 12/06/2021 CLINICAL DATA:  Neck trauma (Age >= 65y) unwitnessed fall. Patient sustained an unwitnessed fall. Patient was found on the ground by staff. Patient placed back in bed. Patient moving all extremities. MD notified. EXAM: CT HEAD WITHOUT CONTRAST CT CERVICAL SPINE WITHOUT CONTRAST TECHNIQUE: Multidetector CT imaging of the head and cervical spine was performed following the standard protocol without intravenous contrast. Multiplanar CT image reconstructions of the cervical spine were also generated. RADIATION DOSE REDUCTION: This exam was performed according to the departmental dose-optimization program which includes automated exposure control, adjustment of the mA and/or kV according to patient size and/or use of iterative reconstruction technique. COMPARISON:  None Available. FINDINGS: CT HEAD FINDINGS Brain: No evidence of large-territorial acute infarction. No parenchymal hemorrhage. No mass lesion. No extra-axial collection. No mass effect or midline shift. No hydrocephalus. Basilar cisterns are patent. Vascular: No hyperdense vessel. Atherosclerotic calcifications are present within the cavernous internal carotid arteries. Skull: No acute fracture or focal lesion. Sinuses/Orbits: Paranasal sinuses and mastoid air  cells are clear. The orbits are unremarkable. Other: None. CT CERVICAL SPINE FINDINGS Alignment: Normal. Skull base and vertebrae: No acute fracture. No aggressive appearing focal osseous lesion or focal pathologic process. Soft tissues and spinal canal: No prevertebral fluid or swelling. No visible canal hematoma. Upper chest: Unremarkable. Other: Atherosclerotic plaque of the carotid arteries within the neck. IMPRESSION: 1. No acute intracranial abnormality. 2. No acute displaced fracture or traumatic listhesis of the cervical spine. Electronically Signed   By: Iven Finn M.D.   On: 12/06/2021 19:30   CT CERVICAL SPINE WO CONTRAST  Result Date: 12/06/2021 CLINICAL DATA:  Neck trauma (Age >= 65y) unwitnessed fall. Patient sustained an unwitnessed fall. Patient was found on the ground by staff. Patient placed back in bed. Patient moving all extremities. MD notified. EXAM: CT HEAD WITHOUT CONTRAST CT CERVICAL SPINE WITHOUT CONTRAST TECHNIQUE: Multidetector CT imaging of the head and cervical spine was performed following the standard protocol  without intravenous contrast. Multiplanar CT image reconstructions of the cervical spine were also generated. RADIATION DOSE REDUCTION: This exam was performed according to the departmental dose-optimization program which includes automated exposure control, adjustment of the mA and/or kV according to patient size and/or use of iterative reconstruction technique. COMPARISON:  None Available. FINDINGS: CT HEAD FINDINGS Brain: No evidence of large-territorial acute infarction. No parenchymal hemorrhage. No mass lesion. No extra-axial collection. No mass effect or midline shift. No hydrocephalus. Basilar cisterns are patent. Vascular: No hyperdense vessel. Atherosclerotic calcifications are present within the cavernous internal carotid arteries. Skull: No acute fracture or focal lesion. Sinuses/Orbits: Paranasal sinuses and mastoid air cells are clear. The orbits are  unremarkable. Other: None. CT CERVICAL SPINE FINDINGS Alignment: Normal. Skull base and vertebrae: No acute fracture. No aggressive appearing focal osseous lesion or focal pathologic process. Soft tissues and spinal canal: No prevertebral fluid or swelling. No visible canal hematoma. Upper chest: Unremarkable. Other: Atherosclerotic plaque of the carotid arteries within the neck. IMPRESSION: 1. No acute intracranial abnormality. 2. No acute displaced fracture or traumatic listhesis of the cervical spine. Electronically Signed   By: Iven Finn M.D.   On: 12/06/2021 19:30   DG Pelvis Portable  Result Date: 12/06/2021 CLINICAL DATA:  Fall EXAM: PORTABLE PELVIS 1-2 VIEWS COMPARISON:  CT pelvis from 12/06/2021 FINDINGS: Contrast medium in the urinary bladder from recent CT scan. Chronic loss of intervertebral disc height at L5-S1. No fracture or acute bony findings identified. IMPRESSION: 1. No acute findings. 2. Chronic loss of intervertebral disc height at L5-S1. Electronically Signed   By: Van Clines M.D.   On: 12/06/2021 17:37        Scheduled Meds:  busPIRone  15 mg Oral BID   etomidate  8 mg Intravenous Once   insulin aspart  0-15 Units Subcutaneous TID WC   pantoprazole  40 mg Oral Daily   sodium chloride flush  3 mL Intravenous Q12H   Continuous Infusions:  sodium chloride     sodium chloride 50 mL/hr at 12/08/21 0721   heparin 1,300 Units/hr (12/08/21 0310)   methylPREDNISolone (SOLU-MEDROL) injection 1,000 mg (12/08/21 0911)     LOS: 2 days    Time spent: 42mns    JKathie Dike MD [facility]   If 7PM-7AM, please contact night-coverage [email or web address]  12/08/2021, 11:46 AM

## 2021-12-08 NOTE — Plan of Care (Signed)
  Problem: Safety: Goal: Non-violent Restraint(s) Outcome: Not Progressing   Problem: Education: Goal: Knowledge of General Education information will improve Description: Including pain rating scale, medication(s)/side effects and non-pharmacologic comfort measures Outcome: Not Progressing   Problem: Health Behavior/Discharge Planning: Goal: Ability to manage health-related needs will improve Outcome: Not Progressing   Problem: Clinical Measurements: Goal: Ability to maintain clinical measurements within normal limits will improve Outcome: Not Progressing Goal: Will remain free from infection Outcome: Not Progressing Goal: Diagnostic test results will improve Outcome: Not Progressing Goal: Respiratory complications will improve Outcome: Not Progressing Goal: Cardiovascular complication will be avoided Outcome: Not Progressing   Problem: Activity: Goal: Risk for activity intolerance will decrease Outcome: Not Progressing   Problem: Nutrition: Goal: Adequate nutrition will be maintained Outcome: Not Progressing   Problem: Coping: Goal: Level of anxiety will decrease Outcome: Not Progressing   Problem: Elimination: Goal: Will not experience complications related to bowel motility Outcome: Not Progressing Goal: Will not experience complications related to urinary retention Outcome: Not Progressing   Problem: Pain Managment: Goal: General experience of comfort will improve Outcome: Not Progressing   Problem: Safety: Goal: Ability to remain free from injury will improve Outcome: Not Progressing   Problem: Skin Integrity: Goal: Risk for impaired skin integrity will decrease Outcome: Not Progressing   Problem: Education: Goal: Ability to describe self-care measures that may prevent or decrease complications (Diabetes Survival Skills Education) will improve Outcome: Not Progressing Goal: Individualized Educational Video(s) Outcome: Not Progressing   Problem:  Coping: Goal: Ability to adjust to condition or change in health will improve Outcome: Not Progressing   Problem: Fluid Volume: Goal: Ability to maintain a balanced intake and output will improve Outcome: Not Progressing   Problem: Health Behavior/Discharge Planning: Goal: Ability to identify and utilize available resources and services will improve Outcome: Not Progressing Goal: Ability to manage health-related needs will improve Outcome: Not Progressing   Problem: Metabolic: Goal: Ability to maintain appropriate glucose levels will improve Outcome: Not Progressing   Problem: Nutritional: Goal: Maintenance of adequate nutrition will improve Outcome: Not Progressing Goal: Progress toward achieving an optimal weight will improve Outcome: Not Progressing   Problem: Skin Integrity: Goal: Risk for impaired skin integrity will decrease Outcome: Not Progressing   Problem: Tissue Perfusion: Goal: Adequacy of tissue perfusion will improve Outcome: Not Progressing   Problem: Safety: Goal: Violent Restraint(s) Outcome: Not Progressing

## 2021-12-09 ENCOUNTER — Inpatient Hospital Stay (HOSPITAL_COMMUNITY): Payer: Medicare PPO

## 2021-12-09 ENCOUNTER — Encounter (HOSPITAL_COMMUNITY)
Admission: EM | Disposition: A | Discharge status: Hospice | Payer: Self-pay | Source: Home / Self Care | Attending: Internal Medicine

## 2021-12-09 ENCOUNTER — Encounter (HOSPITAL_COMMUNITY): Payer: Self-pay | Admitting: Internal Medicine

## 2021-12-09 DIAGNOSIS — G9340 Encephalopathy, unspecified: Secondary | ICD-10-CM | POA: Diagnosis not present

## 2021-12-09 DIAGNOSIS — D6861 Antiphospholipid syndrome: Secondary | ICD-10-CM | POA: Diagnosis not present

## 2021-12-09 DIAGNOSIS — I2699 Other pulmonary embolism without acute cor pulmonale: Secondary | ICD-10-CM | POA: Diagnosis not present

## 2021-12-09 DIAGNOSIS — N400 Enlarged prostate without lower urinary tract symptoms: Secondary | ICD-10-CM | POA: Diagnosis not present

## 2021-12-09 HISTORY — PX: INSERTION OF DIALYSIS CATHETER: SHX1324

## 2021-12-09 LAB — THYROID ANTIBODIES
Thyroglobulin Antibody: 4 IU/mL — ABNORMAL HIGH (ref 0.0–0.9)
Thyroperoxidase Ab SerPl-aCnc: 12 IU/mL (ref 0–34)

## 2021-12-09 LAB — BASIC METABOLIC PANEL
Anion gap: 6 (ref 5–15)
BUN: 20 mg/dL (ref 8–23)
CO2: 23 mmol/L (ref 22–32)
Calcium: 8.7 mg/dL — ABNORMAL LOW (ref 8.9–10.3)
Chloride: 110 mmol/L (ref 98–111)
Creatinine, Ser: 0.8 mg/dL (ref 0.61–1.24)
GFR, Estimated: >60 mL/min (ref >60)
Glucose, Bld: 136 mg/dL — ABNORMAL HIGH (ref 70–99)
Potassium: 3.6 mmol/L (ref 3.5–5.1)
Sodium: 139 mmol/L (ref 135–145)

## 2021-12-09 LAB — CBC
HCT: 36.8 % — ABNORMAL LOW (ref 39.0–52.0)
Hemoglobin: 12.5 g/dL — ABNORMAL LOW (ref 13.0–17.0)
MCH: 30.3 pg (ref 26.0–34.0)
MCHC: 34 g/dL (ref 30.0–36.0)
MCV: 89.3 fL (ref 80.0–100.0)
Platelets: 207 10*3/uL (ref 150–400)
RBC: 4.12 MIL/uL — ABNORMAL LOW (ref 4.22–5.81)
RDW: 13 % (ref 11.5–15.5)
WBC: 9.8 10*3/uL (ref 4.0–10.5)
nRBC: 0 % (ref 0.0–0.2)

## 2021-12-09 LAB — GLUCOSE, CAPILLARY
Glucose-Capillary: 101 mg/dL — ABNORMAL HIGH (ref 70–99)
Glucose-Capillary: 107 mg/dL — ABNORMAL HIGH (ref 70–99)
Glucose-Capillary: 115 mg/dL — ABNORMAL HIGH (ref 70–99)
Glucose-Capillary: 133 mg/dL — ABNORMAL HIGH (ref 70–99)
Glucose-Capillary: 93 mg/dL (ref 70–99)

## 2021-12-09 LAB — HEPARIN LEVEL (UNFRACTIONATED): Heparin Unfractionated: 0.77 IU/mL — ABNORMAL HIGH (ref 0.30–0.70)

## 2021-12-09 LAB — SJOGRENS SYNDROME-B EXTRACTABLE NUCLEAR ANTIBODY: SSB (La) (ENA) Antibody, IgG: <0.2 AI (ref 0.0–0.9)

## 2021-12-09 LAB — APTT: aPTT: 80 seconds — ABNORMAL HIGH (ref 24–36)

## 2021-12-09 LAB — ANA W/REFLEX IF POSITIVE: Anti Nuclear Antibody (ANA): NEGATIVE

## 2021-12-09 LAB — SJOGRENS SYNDROME-A EXTRACTABLE NUCLEAR ANTIBODY: SSA (Ro) (ENA) Antibody, IgG: <0.2 AI (ref 0.0–0.9)

## 2021-12-09 LAB — ANTI-DNA ANTIBODY, DOUBLE-STRANDED: ds DNA Ab: <1 IU/mL (ref 0–9)

## 2021-12-09 LAB — ANTI-SMITH ANTIBODY: ENA SM Ab Ser-aCnc: <0.2 AI (ref 0.0–0.9)

## 2021-12-09 SURGERY — INSERTION OF DIALYSIS CATHETER
Anesthesia: Moderate Sedation

## 2021-12-09 MED ORDER — MIDAZOLAM HCL (PF) 5 MG/ML IJ SOLN
INTRAMUSCULAR | Status: DC | PRN
  Administered 2021-12-09 (×3): 2 mg via INTRAVENOUS

## 2021-12-09 MED ORDER — FENTANYL CITRATE (PF) 100 MCG/2ML IJ SOLN
INTRAMUSCULAR | Status: AC
  Filled 2021-12-09: qty 4

## 2021-12-09 MED ORDER — VALPROATE SODIUM 100 MG/ML IV SOLN
500.0000 mg | Freq: Once | INTRAVENOUS | Status: AC
  Administered 2021-12-09: 500 mg via INTRAVENOUS
  Filled 2021-12-09: qty 5

## 2021-12-09 MED ORDER — MIDAZOLAM HCL (PF) 5 MG/ML IJ SOLN
INTRAMUSCULAR | Status: AC
  Filled 2021-12-09: qty 2

## 2021-12-09 MED ORDER — HEPARIN SODIUM (PORCINE) 1000 UNIT/ML IJ SOLN
INTRAMUSCULAR | Status: AC
  Filled 2021-12-09: qty 10

## 2021-12-09 NOTE — Progress Notes (Signed)
PROGRESS NOTE    Spencer Ross  XIP:[id number] DOB: 09-May-1950 DOA: 12/05/2021 PCP: Lujean Amel, MD    Brief Narrative:  71 year old male with a history of antiphospholipid antibody, pulmonary embolism in the past on chronic anticoagulation, recently had COVID-19 infection approximately 3 weeks ago.  His wife reported that he had progressive confusion over the past few weeks.  He was brought to the hospital for evaluation where MRI showed cortical restricted diffusion primarily in the right cerebral hemisphere, bilateral caudate heads and anterior putamina.  Neurology following.  He underwent lumbar puncture with several CSF studies in process.  Infectious meningitis/encephalitis panel noted to be negative.  He was started on high-dose IV steroids.  Overall mental status appears to be waxing and waning. Plan is to start plasma exchange   Assessment & Plan:   Principal Problem:   Acute encephalopathy Active Problems:   Pulmonary emboli (HCC)   Antiphospholipid syndrome (HCC)   GERD (gastroesophageal reflux disease)   BPH (benign prostatic hyperplasia)    Acute encephalopathy -Patient's wife describes progressive confusion and agitation over the past few weeks since he was diagnosed with COVID -Initial CT head was unremarkable for intracranial process -MRI brain shows cortical restricted diffusion primarily in the right cerebral hemisphere, bilateral caudate heads and anterior putamina. -Neurology following -Continuous EEG did not show any epileptiform discharges, discontinued -Lumbar puncture performed on 11/11.  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 -Requested PCCM assistance for catheter placement  Work up resulted: -infectious meningitis/encephalitis panel of CSF negative, cryptococcal antigen negative, HIV negative, TSH normal, ammonia normal, B12 426, RPR negative, ANA neg, Sjogren negative, anti Smith, DS DNA neg, CSF fungal culture no growth  Work up pending: -IgG CSF, Oligoclonal bands CSF, Protein/glucose CSF, Beta Isoform (CJD) CSF, Thyroid ab, VDRL CSF   Recent COVID-19 infection -First diagnosed approximately 3 weeks ago -He has been vaccinated -He was taking steroids as well as ivermectin prior to admission   Antiphospholipid antibody syndrome -On chronic anticoagulation -Xarelto was on hold for lumbar puncture -Currently on heparin infusion   History of pulmonary embolus -Patient had PE approximately 8 years ago -With underlying antiphospholipid antibody, recommendations are for lifelong anticoagulation -He is anticoagulated with heparin infusion -Resume Xarelto when able to consistently take p.o. and no further procedures are planned   Anxiety -Continue on home dose of BuSpar   DVT prophylaxis: Heparin infusion  Code Status: Full code Family Communication: Updated patient's wife and son at the bedside Disposition Plan: Status is: Inpatient Remains inpatient appropriate because: Continued work-up of encephalopathy     Consultants:  Neurology  Procedures:  Lumbar puncture 11/11  Antimicrobials:      Subjective: He was agitated overnight. Family feels he is a little better today as compared to yesterday. Still requiring restraints.  Objective: Vitals:   12/08/21 2312 12/09/21 0400 12/09/21 0800 12/09/21 1200  BP: (!) 151/83 (!) 148/90 (!) 140/90 (!) 150/103  Pulse: 64 68 67 62  Resp: (!) 22 20 (!) 21 19  Temp: 98.8 F (37.1 C) 98 F (36.7 C) 98.6 F (37 C) 98.1  F (36.7  C)  TempSrc: Axillary Axillary Oral Oral  SpO2: 91% 94% 93% 95%  Weight:      Height:        Intake/Output Summary (Last 24 hours) at 12/09/2021 1239 Last data filed at 12/09/2021 6160 Gross per 24 hour  Intake 941.66 ml  Output 300 ml  Net 641.66 ml   Filed Weights   12/05/21 1452 12/06/21 2217 12/08/21 0214  Weight: 80.7 kg 81.3 kg 81.8 kg    Examination:  General exam: He is awake and calm.  Currently in restraints Respiratory system: Clear to auscultation. Respiratory effort normal. Cardiovascular system: S1 & S2 heard, RRR. No JVD, murmurs, rubs, gallops or clicks. No pedal edema. Gastrointestinal system: Abdomen is nondistended, soft and nontender. No organomegaly or masses felt. Normal bowel sounds heard. Central nervous system: Speech is dysarthric.  Pupils are equal and reactive bilaterally, moving all 4 extremities spontaneously Extremities: Symmetric 5 x 5 power. Skin: No rashes, lesions or ulcers Psychiatry: calm, able to answer some simple questions, but just stares when asked to go into further details    Data Reviewed: I have personally reviewed following labs and imaging studies  CBC: Recent Labs  Lab 12/05/21 1527 12/08/21 1021 12/09/21 0020  WBC 5.1 13.7* 9.8  HGB 13.8 13.7 12.5*  HCT 41.1 41.1 36.8*  MCV 90.7 90.9 89.3  PLT 205 235 737   Basic Metabolic Panel: Recent Labs  Lab 12/05/21 1527 12/08/21 1021 12/09/21 0020  NA 141 143 139  K 3.7 3.2* 3.6  CL 107 111 110  CO2 '24 22 23  '$ GLUCOSE 113* 94 136*  BUN '17 21 20  '$ CREATININE 1.03 0.87 0.80  CALCIUM 9.5 9.2 8.7*  MG  --  2.0  --   PHOS  --  3.5  --    GFR: Estimated Creatinine Clearance: 102.3 mL/min (by C-G formula based on SCr of 0.8 mg/dL). Liver Function Tests: Recent Labs  Lab 12/05/21 1527 12/08/21 1021  AST 25 23  ALT 17 15  ALKPHOS 48 46  BILITOT 0.8 0.7  PROT 6.7 6.6  ALBUMIN 3.9 3.7   No results for input(s): "LIPASE", "AMYLASE" in the last 168 hours. Recent  Labs  Lab 12/06/21 2307  AMMONIA 11   Coagulation Profile: No results for input(s): "INR", "PROTIME" in the last 168 hours. Cardiac Enzymes: No results for input(s): "CKTOTAL", "CKMB", "CKMBINDEX", "TROPONINI" in the last 168 hours. BNP (last 3 results) No results for input(s): "PROBNP" in the last 8760 hours. HbA1C: Recent Labs    12/08/21 1021  HGBA1C 5.5   CBG: Recent Labs  Lab 12/08/21 1139 12/08/21 1736 12/09/21 0017 12/09/21 0619 12/09/21 1209  GLUCAP 116* 123* 133* 93 101*   Lipid Profile: No results for input(s): "CHOL", "HDL", "LDLCALC", "TRIG", "CHOLHDL", "LDLDIRECT" in the last 72 hours. Thyroid Function Tests: Recent Labs    12/06/21 2307  TSH 0.906   Anemia Panel: Recent Labs    12/06/21 2307  VITAMINB12 426   Sepsis Labs: No results for input(s): "PROCALCITON", "LATICACIDVEN" in the last 168 hours.  Recent Results (from the past 240 hour(s))  CSF culture w Gram Stain     Status: None (Preliminary result)   Collection Time: 12/06/21  9:35 PM   Specimen: CSF; Cerebrospinal Fluid  Result Value Ref Range Status   Specimen Description CSF  Final   Special Requests TUBE 2 POSSIBLE CJD  Final   Gram Stain   Final    CYTOSPIN SMEAR WBC PRESENT, PREDOMINANTLY  MONONUCLEAR NO ORGANISMS SEEN    Culture   Final    NO GROWTH 3 DAYS Performed at [facility], [place] [address] [place], [place] [address]    Report Status PENDING  Incomplete  Fungus Culture With Stain     Status: None (Preliminary result)   Collection Time: 12/06/21  9:35 PM   Specimen: Lumbar Puncture; Cerebrospinal Fluid  Result Value Ref Range Status   Fungus Stain Final report  Final    Comment: (NOTE) Performed At: [facility] [address] [place], [place] [id number] Rush Farmer MD DQ:[id number]    Fungus (Mycology) Culture PENDING  Incomplete   Fungal Source CSF  Final    Comment: Performed at [facility], [place] [address], [place], [place] [address]   Culture, fungus without smear     Status: None (Preliminary result)   Collection Time: 12/06/21  9:35 PM   Specimen: CSF; Cerebrospinal Fluid  Result Value Ref Range Status   Specimen Description CSF  Final   Special Requests POSSIBLE CJD  Final   Culture   Final    NO FUNGUS ISOLATED AFTER 2 DAYS Performed at [facility], [place] [address] [place], [place] [address]    Report Status PENDING  Incomplete  Fungus Culture Result     Status: None   Collection Time: 12/06/21  9:35 PM  Result Value Ref Range Status   Result 1 Comment  Final    Comment: (NOTE) KOH/Calcofluor preparation:  no fungus observed. Performed At: [facility] [place], [place] [id number] Rush Farmer MD HF:[id number]   MRSA Next Gen by PCR, Nasal     Status: None   Collection Time: 12/06/21 11:25 PM   Specimen: Nasal Mucosa; Nasal Swab  Result Value Ref Range Status   MRSA by PCR Next Gen NOT DETECTED NOT DETECTED Final    Comment: (NOTE) The GeneXpert MRSA Assay (FDA approved for NASAL specimens only), is one component of a comprehensive MRSA colonization surveillance program. It is not intended to diagnose MRSA infection nor to guide or monitor treatment for MRSA infections. Test performance is not FDA approved in patients less than 54 years old. Performed at [facility], [place] [address], [place], [place] [address]          Radiology Studies: No results found.      Scheduled Meds:  busPIRone  15 mg Oral BID   divalproex  500 mg Oral QHS   insulin aspart  0-15 Units Subcutaneous TID WC   pantoprazole  40 mg Oral Daily   sodium chloride flush  3 mL Intravenous Q12H   Continuous Infusions:  sodium chloride     0.9 % NaCl with KCl 40 mEq / L 75 mL/hr at 12/09/21 0213   heparin 1,300 Units/hr (12/09/21 0827)   methylPREDNISolone (SOLU-MEDROL) injection 1,000 mg (12/09/21 0835)     LOS: 3 days    Time spent: 61mns    JKathie Dike MD [facility]   If 7PM-7AM, please contact night-coverage [email or web address]  12/09/2021, 12:39 PM

## 2021-12-09 NOTE — Progress Notes (Signed)
Neurology Progress Note   Subjective: Seen and examined.  Wife at bedside.  Overnight remained agitated.  Discussed with the overnight neurologist who wanted to try Depakote for agitation with the patient refused.   Exam: Vitals:   12/09/21 0400 12/09/21 0800  BP: (!) 148/90 (!) 140/90  Pulse: 68 67  Resp: 20 (!) 21  Temp: 98 F (36.7 C) 98.6 F (37 C)  SpO2: 94% 93%   GEN: Lying in bed in restraints, appears uncomfortable and akathisic HEENT: Normocephalic atraumatic Lungs: Clear Cardiovascular: Regular rhythm Abdomen nondistended nontender Neurological exam He is drowsy, keeps his eyes closed.  Resists eye opening. Later on on insistence, he did open his eyes and track the examiner He was able to name his wife today but other than that did not talk much. Upon asking him if he had pain anywhere, he nodded no but upon specifically asking him if he had chest pain he said yes but could not provide more details. Cranial nerves: Pupils equal round reactive to light, no hindrance of extraocular movements, blinks to threat from both sides, visual fields full. Motor examination moving all 4 extremities spontaneously but does not really follow much in terms of commands to help with the exam Sensation: Withdrawal to noxious stimulation in all 4  Pertinent Labs: CBC    Component Value Date/Time   WBC 9.8 12/09/2021 0020   RBC 4.12 (L) 12/09/2021 0020   HGB 12.5 (L) 12/09/2021 0020   HGB 14.0 07/14/2013 1037   HCT 36.8 (L) 12/09/2021 0020   HCT 42.2 07/14/2013 1037   PLT 207 12/09/2021 0020   PLT 171 07/14/2013 1037   MCV 89.3 12/09/2021 0020   MCV 89.0 07/14/2013 1037   MCH 30.3 12/09/2021 0020   MCHC 34.0 12/09/2021 0020   RDW 13.0 12/09/2021 0020   RDW 13.8 07/14/2013 1037   LYMPHSABS 1.3 07/14/2013 1037   MONOABS 0.3 07/14/2013 1037   EOSABS 0.2 07/14/2013 1037   BASOSABS 0.0 07/14/2013 1037       Latest Ref Rng & Units 12/09/2021   12:20 AM 12/08/2021   10:21 AM  12/05/2021    3:27 PM  BMP  Glucose 70 - 99 mg/dL 136  94  113   BUN 8 - 23 mg/dL '20  21  17   '$ Creatinine 0.61 - 1.24 mg/dL 0.80  0.87  1.03   Sodium 135 - 145 mmol/L 139  143  141   Potassium 3.5 - 5.1 mmol/L 3.6  3.2  3.7   Chloride 98 - 111 mmol/L 110  111  107   CO2 22 - 32 mmol/L '23  22  24   '$ Calcium 8.9 - 10.3 mg/dL 8.7  9.2  9.5      Workup summary: Negative CTA chest/abdomen/pelvis with contrast for malignancy screening  LTM negative for seizures  CSF studies:    WBC 0/0, RBC 0/0, protein and glucose pending due to concern for CJD M/E panel negative (cryptococcus, CMV, enterovirus, initiation coli K1, H. influenzae, HSV-1, HSV-2, HHV-6, human per echovirus, Listeria, Neisseria, Streptococcus agalactiae and pneumoniae, VZV)  Gram stain negative Bacterial culture no growth in 3 days, fungal culture pending  Pending: Beta Isoform Creutzfeldt Jacob disease, 14 3 3, RT quic   Oligoclonal bands, IgG index   Autoimmune encephalitis panel on CSF   Lyme antigen CSF  Serum studies:   TSH normal, ammonia normal, B12 426,  Pending: MMA in process, B1 in process   HIV negative, RPR negative  Antinuclear  antibody negative Sjogren's negative Thyroid antibodies pending    Impression:  71 year old man past history of recent COVID infection 3 weeks ago followed by confusion, also has past history of hyperlipidemia, PE, antiphospholipid antibody syndrome on Xarelto who presented with changes in his personality and bizarre behavior since he got COVID.  MRI brain showed cortical riveting restricted diffusion-primarily in the right cerebral hemisphere as well as in bilateral caudate heads and anterior putamen.  EEG with right hemispheric slowing.  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 He was started on steroids, initially showed some improvement but then again had agitation and continues to have altered mental status.  Literature review reveals some support for using a second modality such as plasma exchange or IVIG in addition to steroids-given his antiphospholipid antibody syndrome, would avoid IVIG for the risk of hypercoagulability. Discussed with the family about starting plasma exchange which they agreed to.   Recommendations: - Pulse dose steroids, continue 11/11 - 11/15 - Monitor BG while on steroids  - Continue PPI while on steroids  -Requesting IR consultation for access for plasma exchange today.  Possibly scheduling plasma exchange today or tomorrow depending on when he gets the line.  Requested dialysis for schedule.  -- Amie Portland, MD Neurologist [facility] Pager: [phone]

## 2021-12-09 NOTE — Progress Notes (Signed)
Spencer Ross for heparin Indication:  APLS w/ h/o VTE  Allergies  Allergen Reactions   Diphenhydramine     Other reaction(s): prostate swelling, increase urination   Penicillins Other (See Comments)    "childhood reaction from mother"   Trazodone Hcl     Other reaction(s): weak stream    Patient Measurements: Height: '6\' 5"'$  (195.6 cm) Weight: 81.8 kg (180 lb 5.4 oz) IBW/kg (Calculated) : 89.1  Vital Signs: Temp: 98.6 F (37 C) (11/14 0800) Temp Source: Oral (11/14 0800) BP: 140/90 (11/14 0800) Pulse Rate: 67 (11/14 0800)  Labs: Recent Labs    12/08/21 1021 12/08/21 2011 12/09/21 0020  HGB 13.7  --  12.5*  HCT 41.1  --  36.8*  PLT 235  --  207  APTT 72* 79* 80*  HEPARINUNFRC 0.83*  --  0.77*  CREATININE 0.87  --  0.80     Estimated Creatinine Clearance: 102.3 mL/min (by C-G formula based on SCr of 0.8 mg/dL).   Medical History: Past Medical History:  Diagnosis Date   Hypercholesteremia    Pulmonary embolism (HCC)     Medications:  Medications Prior to Admission  Medication Sig Dispense Refill Last Dose   ascorbic acid (VITAMIN C) 500 MG tablet Take 1,000 mg by mouth daily.   Past Week   bismuth subsalicylate (PEPTO BISMOL) 262 MG/15ML suspension Take 30 mLs by mouth every 6 (six) hours as needed for diarrhea or loose stools.   Past Month   busPIRone (BUSPAR) 7.5 MG tablet Take 15 mg by mouth 2 (two) times daily as needed (anxiety).   12/05/2021   Calcium-Magnesium-Vitamin D (CALCIUM MAGNESIUM PO) Take 5 mLs by mouth daily. Oral liquid   Past Week   Cholecalciferol (VITAMIN D3) 125 MCG (5000 UT) TABS Take 1 tablet by mouth daily.   12/05/2021   IVERMECTIN PO Take 18 mg by mouth daily. Ivermectin oil '10mg'$ /ml suspension.   12/05/2021 at 0900   predniSONE (DELTASONE) 5 MG tablet Take 5-15 mg by mouth See admin instructions. Take 15 mg for 5 days, then take 10 mg for 5 days, and then take 5 mg for 5 days and then stop.    12/05/2021   Rivaroxaban (XARELTO) 20 MG TABS tablet Take 1 tablet (20 mg total) by mouth daily with supper. Start taking March 29th, 2015 (Patient taking differently: Take 20 mg by mouth daily with supper.) 30 tablet 3 12/04/2021 at 1930   traZODone (DESYREL) 50 MG tablet Take 25 mg by mouth at bedtime as needed for sleep.      zinc gluconate 50 MG tablet Take 50 mg by mouth daily.   12/05/2021   benzonatate (TESSALON) 200 MG capsule Take 200 mg by mouth 3 (three) times daily as needed. (Patient not taking: Reported on 12/06/2021)   Not Taking   dexamethasone (DECADRON) 6 MG tablet Take 6 mg by mouth once. (Patient not taking: Reported on 12/06/2021)   Not Taking   Multiple Vitamin (MULTIVITAMIN WITH MINERALS) TABS tablet Take 1 tablet by mouth daily. (Patient not taking: Reported on 12/06/2021)   Not Taking   omeprazole (PRILOSEC) 20 MG capsule Take 20 mg by mouth daily. (Patient not taking: Reported on 12/06/2021)   Not Taking   saw palmetto 160 MG capsule Take 160 mg by mouth daily. (Patient not taking: Reported on 12/06/2021)   Not Taking   Scheduled:   busPIRone  15 mg Oral BID   divalproex  500 mg Oral QHS  insulin aspart  0-15 Units Subcutaneous TID WC   pantoprazole  40 mg Oral Daily   sodium chloride flush  3 mL Intravenous Q12H   Infusions:   sodium chloride     0.9 % NaCl with KCl 40 mEq / L 75 mL/hr at 12/09/21 3668   heparin 1,300 Units/hr (12/09/21 0827)   methylPREDNISolone (SOLU-MEDROL) injection 1,000 mg (12/09/21 1594)    Assessment: 71yo male admitted w/ AMS after Covid Dx, now to start heparin after pt refused PO Xarelto for APLS w/ h/o VTE; last dose of Xarelto taken 11/9.  Heparin level remains still falsely elevated at 0.77 due to recent DOAC, aPTT continues to be therapeutic at 80, on heparin infusion at 1300 units/hr. Hemoglobin down slightly from 13.7>12.5. No s/sx of bleeding or infusion issues noted.   Goal of Therapy:  Heparin level 0.3-0.7 units/ml aPTT  66-102 seconds Monitor platelets by anticoagulation protocol: Yes   Plan:  Continue heparin infusion at 1300 units/hr. Monitor heparin levels, aPTT (while Xarelto affects anti-Xa assay), and CBC.  Erin Hearing PharmD., BCPS Clinical Pharmacist 12/09/2021 9:35 AM

## 2021-12-09 NOTE — Care Management Important Message (Signed)
Important Message  Patient Details  Name: Spencer Ross MRN: [id number] Date of Birth: 06/04/1950   Medicare Important Message Given:  Yes     Orbie Pyo 12/09/2021, 2:40 PM

## 2021-12-09 NOTE — Procedures (Signed)
Central Venous Catheter Insertion Procedure Note  Spencer Ross  [id number]  [birth year]  Date:12/09/21  Time:4:07 PM   Provider Performing:Christalynn Boise   Procedure: Insertion of Non-tunneled Central Venous Catheter(36556)with US guidance (78242)    Indication(s) Hemodialysis  Consent Risks of the procedure as well as the alternatives and risks of each were explained to the patient and/or caregiver.  Consent for the procedure was obtained and is signed in the bedside chart  Anesthesia Topical only with 1% lidocaine   Timeout Verified patient identification, verified procedure, site/side was marked, verified correct patient position, special equipment/implants available, medications/allergies/relevant history reviewed, required imaging and test results available.  Sterile Technique Maximal sterile technique including full sterile barrier drape, hand hygiene, sterile gown, sterile gloves, mask, hair covering, sterile ultrasound probe cover (if used).  Procedure Description Area of catheter insertion was cleaned with chlorhexidine and draped in sterile fashion.   With real-time ultrasound guidance a HD catheter was placed into the right internal jugular vein.  Nonpulsatile blood flow and easy flushing noted in all ports.  The catheter was sutured in place and sterile dressing applied.  Complications/Tolerance None; patient tolerated the procedure well. Chest X-ray is ordered to verify placement for internal jugular or subclavian cannulation.  Chest x-ray is not ordered for femoral cannulation.  EBL Minimal  Specimen(s) None

## 2021-12-10 ENCOUNTER — Inpatient Hospital Stay (HOSPITAL_COMMUNITY): Payer: Medicare PPO

## 2021-12-10 DIAGNOSIS — G9340 Encephalopathy, unspecified: Secondary | ICD-10-CM | POA: Diagnosis not present

## 2021-12-10 LAB — BASIC METABOLIC PANEL
Anion gap: 7 (ref 5–15)
Anion gap: 10 (ref 5–15)
BUN: 20 mg/dL (ref 8–23)
BUN: 22 mg/dL (ref 8–23)
CO2: 23 mmol/L (ref 22–32)
CO2: 21 mmol/L — ABNORMAL LOW (ref 22–32)
Calcium: 8.7 mg/dL — ABNORMAL LOW (ref 8.9–10.3)
Calcium: 8.7 mg/dL — ABNORMAL LOW (ref 8.9–10.3)
Chloride: 109 mmol/L (ref 98–111)
Chloride: 112 mmol/L — ABNORMAL HIGH (ref 98–111)
Creatinine, Ser: 0.81 mg/dL (ref 0.61–1.24)
Creatinine, Ser: 0.7 mg/dL (ref 0.61–1.24)
GFR, Estimated: >60 mL/min (ref >60)
GFR, Estimated: >60 mL/min (ref >60)
Glucose, Bld: 129 mg/dL — ABNORMAL HIGH (ref 70–99)
Glucose, Bld: 120 mg/dL — ABNORMAL HIGH (ref 70–99)
Potassium: 3.6 mmol/L (ref 3.5–5.1)
Potassium: 3.3 mmol/L — ABNORMAL LOW (ref 3.5–5.1)
Sodium: 139 mmol/L (ref 135–145)
Sodium: 143 mmol/L (ref 135–145)

## 2021-12-10 LAB — CSF CULTURE W GRAM STAIN: Culture: NO GROWTH

## 2021-12-10 LAB — CBC
HCT: 39.2 % (ref 39.0–52.0)
Hemoglobin: 13 g/dL (ref 13.0–17.0)
MCH: 30.1 pg (ref 26.0–34.0)
MCHC: 33.2 g/dL (ref 30.0–36.0)
MCV: 90.7 fL (ref 80.0–100.0)
Platelets: 192 10*3/uL (ref 150–400)
RBC: 4.32 MIL/uL (ref 4.22–5.81)
RDW: 12.8 % (ref 11.5–15.5)
WBC: 10.2 10*3/uL (ref 4.0–10.5)
nRBC: 0 % (ref 0.0–0.2)

## 2021-12-10 LAB — VITAMIN B1: Vitamin B1 (Thiamine): 113.9 nmol/L (ref 66.5–200.0)

## 2021-12-10 LAB — GLUCOSE, CAPILLARY
Glucose-Capillary: 113 mg/dL — ABNORMAL HIGH (ref 70–99)
Glucose-Capillary: 83 mg/dL (ref 70–99)
Glucose-Capillary: 93 mg/dL (ref 70–99)

## 2021-12-10 LAB — HEPARIN LEVEL (UNFRACTIONATED): Heparin Unfractionated: 0.94 IU/mL — ABNORMAL HIGH (ref 0.30–0.70)

## 2021-12-10 LAB — BETA ISOFORM (CREUTZFELDT-JAKOB DISEASE)

## 2021-12-10 LAB — APTT: aPTT: 100 seconds — ABNORMAL HIGH (ref 24–36)

## 2021-12-10 MED ORDER — CALCIUM GLUCONATE-NACL 2-0.675 GM/100ML-% IV SOLN
INTRAVENOUS | Status: AC
  Filled 2021-12-10: qty 100

## 2021-12-10 MED ORDER — DIPHENHYDRAMINE HCL 25 MG PO CAPS: 25.0000 mg | ORAL_CAPSULE | Freq: Four times a day (QID) | ORAL | Status: DC | PRN

## 2021-12-10 MED ORDER — CHLORHEXIDINE GLUCONATE CLOTH 2 % EX PADS
6.0000 | MEDICATED_PAD | Freq: Every day | CUTANEOUS | Status: DC
  Administered 2021-12-10 – 2021-12-25 (×14): 6 via TOPICAL

## 2021-12-10 MED ORDER — HEPARIN SODIUM (PORCINE) 1000 UNIT/ML IJ SOLN
1000.0000 [IU] | Freq: Once | INTRAMUSCULAR | Status: AC
  Administered 2021-12-10: 1000 [IU]

## 2021-12-10 MED ORDER — HEPARIN SODIUM (PORCINE) 1000 UNIT/ML IJ SOLN
INTRAMUSCULAR | Status: AC
  Filled 2021-12-10: qty 3

## 2021-12-10 MED ORDER — ACD FORMULA A 0.73-2.45-2.2 GM/100ML VI SOLN
1000.0000 mL | Status: DC
  Administered 2021-12-10: 1000 mL

## 2021-12-10 MED ORDER — VALPROATE SODIUM 100 MG/ML IV SOLN
500.0000 mg | Freq: Every day | INTRAVENOUS | Status: DC
  Administered 2021-12-10 – 2021-12-11 (×2): 500 mg via INTRAVENOUS
  Filled 2021-12-10 (×4): qty 5

## 2021-12-10 MED ORDER — ACD FORMULA A 0.73-2.45-2.2 GM/100ML VI SOLN
Status: AC
  Filled 2021-12-10: qty 1000

## 2021-12-10 MED ORDER — PANTOPRAZOLE SODIUM 40 MG IV SOLR
40.0000 mg | INTRAVENOUS | Status: DC
  Administered 2021-12-10 – 2021-12-24 (×14): 40 mg via INTRAVENOUS
  Filled 2021-12-10 (×14): qty 10

## 2021-12-10 MED ORDER — CALCIUM CARBONATE ANTACID 500 MG PO CHEW: 2.0000 | CHEWABLE_TABLET | ORAL | Status: DC

## 2021-12-10 MED ORDER — CALCIUM GLUCONATE-NACL 2-0.675 GM/100ML-% IV SOLN
2.0000 g | Freq: Once | INTRAVENOUS | Status: AC
  Administered 2021-12-10: 2000 mg via INTRAVENOUS

## 2021-12-10 MED ORDER — ACETAMINOPHEN 325 MG PO TABS: 650.0000 mg | ORAL_TABLET | ORAL | Status: DC | PRN

## 2021-12-10 MED ORDER — ATROPINE SULFATE 1 MG/10ML IJ SOSY: 0.5000 mg | PREFILLED_SYRINGE | Freq: Once | INTRAMUSCULAR | Status: DC | PRN

## 2021-12-10 MED ORDER — SODIUM CHLORIDE 0.9 % IV SOLN
INTRAVENOUS | Status: AC
  Filled 2021-12-10 (×4): qty 200

## 2021-12-10 MED ORDER — GADOBUTROL 1 MMOL/ML IV SOLN
8.0000 mL | Freq: Once | INTRAVENOUS | Status: AC | PRN
  Administered 2021-12-10: 8 mL via INTRAVENOUS

## 2021-12-10 MED ORDER — HALOPERIDOL LACTATE 5 MG/ML IJ SOLN
2.0000 mg | Freq: Once | INTRAMUSCULAR | Status: DC | PRN
  Filled 2021-12-10: qty 1

## 2021-12-10 NOTE — Progress Notes (Addendum)
PROGRESS NOTE    Spencer Ross  PPI:[id number] DOB: [birth year] DOA: 12/05/2021 PCP: Lujean Amel, MD    Brief Narrative:  71 year old male with a history of antiphospholipid antibody, pulmonary embolism in the past on chronic anticoagulation, recently had COVID-19 infection approximately 3 weeks PTA presented with progressive confusion over the past few weeks. MRI showed cortical restricted diffusion primarily in the right cerebral hemisphere, bilateral caudate heads and anterior putamina.  Neurology following.  He underwent lumbar puncture with several CSF studies in process. Infectious Meningitis/encephalitis panel noted to be negative.  He was started on high-dose IV steroids.  Overall mental status appears to be waxing and waning.  Nontunneled right IJ CVC placed by CCM 11/14 with plans to start plasmapheresis 11/15.   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 -Neurology following and their input much appreciated. -Continuous EEG did not show any epileptiform discharges, discontinued -Lumbar puncture performed on 11/11.  Several studies are still currently in process -He has been started on high-dose Solu-Medrol 1 g daily x5 days, currently day 5 of 5 -Empirically treated with acyclovir, but this was discontinued when HSV on CSF was negative -Overall mentation appears to be waxing and waning but very slowly improving as per bedside RN  report -Concern is that mental status changes may be related to possible LGI1 antibody encephalitis -As per neurology follow-up, plan to start plasma exchange.  Now has right IJ CVC and awaiting family consent.  As per neurology update, will get 5 rounds of Plex (November 15, 17, 20, 22 and 24).  Since patient unable to take p.o. consistently from AMS, changed PPI to IV, bedtime Depakote started for mood stabilization to IV as well.  Work up resulted: -infectious meningitis/encephalitis panel of CSF negative, cryptococcal antigen negative, HIV negative, TSH normal, ammonia normal, B12 426, RPR negative, ANA neg, Sjogren negative, anti Smith, DS DNA neg, CSF fungal culture no growth  Work up pending: -IgG CSF, Oligoclonal bands CSF, Protein/glucose CSF, Beta Isoform (CJD) CSF, Thyroid ab, VDRL CSF   Recent COVID-19 infection -First diagnosed approximately 3 weeks ago -He has been vaccinated -He was taking steroids as well as ivermectin prior to admission -Currently not hypoxic and hemodynamically stable.   Antiphospholipid antibody syndrome -On chronic anticoagulation -Xarelto was on hold for lumbar puncture -Currently on heparin infusion.  Now that s/p LP, consider resuming Xarelto, confirm with neurology regarding no need for further procedures.   History of pulmonary embolus -Patient had PE approximately 8 years ago -With underlying antiphospholipid antibody, recommendations are for lifelong anticoagulation -He is anticoagulated with heparin infusion -Resume Xarelto when able to consistently take p.o. and no further procedures are planned   Anxiety -Continue on home dose of BuSpar  Asymptomatic sinus bradycardia: Telemetry shows SB in the 40s-50s. TSH normal.  Not on rate control medications.  Monitor on telemetry.   DVT prophylaxis: Heparin infusion Code Status: Full code Family Communication: None at side. Disposition Plan: Status is: Inpatient Remains inpatient appropriate  because: Continued work-up and treatment including plasma exchange, of encephalopathy  Consultants:  Neurology PCCM for CVC placement  Procedures:  Lumbar puncture 11/11 RIJ CVC placement 11/14  Antimicrobials:  All discontinued   Subjective: As per RN, alert and oriented x2, following simple commands such as "scoot up in bed".  To me, alert and oriented only to self.  Remains in bilateral soft wrist restraints and a Posey belt.  Has bilateral mittens.  Fidgety in bed.  Safety sitter at bedside and has consumed 2 bites of his breakfast.  Objective: Vitals:   12/09/21 1950 12/09/21 2320 12/10/21 0350 12/10/21 0901  BP: (!) 140/91 139/78 (!) 145/85 (!) 130/95  Pulse: 64 (!) 51 (!) 52 70  Resp: (!) '23 16 17 17  '$ Temp: 97.7 F (36.5 C) 98 F (36.7 C) 98.1 F (36.7 C) 97.8 F (36.6 C)  TempSrc: Oral Oral Oral Oral  SpO2: 94% 94% 92% 97%  Weight:      Height:        Intake/Output Summary (Last 24 hours) at 12/10/2021 5638 Last data filed at 12/10/2021 0500 Gross per 24 hour  Intake 2067.12 ml  Output 1201 ml  Net 866.12 ml   Filed Weights   12/05/21 1452 12/06/21 2217 12/08/21 0214  Weight: 80.7 kg 81.3 kg 81.8 kg    Examination:  General exam: Young male, moderately built and nourished lying with restraints as described above, in bed, fidgety but not agitated. Respiratory system: Clear to auscultation.  No increased work of breathing. Cardiovascular system: S1 and S2 heard, regular bradycardia.  No murmurs, JVD or pedal edema.  Telemetry personally reviewed: SB in the 40s-50s. Gastrointestinal system: Abdomen is nondistended, soft and nontender. No organomegaly or masses felt. Normal bowel sounds heard. Central nervous system: Alert and oriented only to self.  No cranial nerve deficits appreciated. Extremities: Symmetric 5 x 5 power. Skin: No rashes, lesions or ulcers Psychiatry: Does not appear anxious or agitated.  Rest cannot be assessed due to AMS at this  time.    Data Reviewed: I have personally reviewed following labs and imaging studies  CBC: Recent Labs  Lab 12/05/21 1527 12/08/21 1021 12/09/21 0020 12/10/21 0350  WBC 5.1 13.7* 9.8 10.2  HGB 13.8 13.7 12.5* 13.0  HCT 41.1 41.1 36.8* 39.2  MCV 90.7 90.9 89.3 90.7  PLT 205 235 207 937   Basic Metabolic Panel: Recent Labs  Lab 12/05/21 1527 12/08/21 1021 12/09/21 0020 12/10/21 0350  NA 141 143 139 139  K 3.7 3.2* 3.6 3.6  CL 107 111 110 109  CO2 '24 22 23 23  '$ GLUCOSE 113* 94 136* 129*  BUN '17 21 20 20  '$ CREATININE 1.03 0.87 0.80 0.81  CALCIUM 9.5 9.2 8.7* 8.7*  MG  --  2.0  --   --   PHOS  --  3.5  --   --    GFR: Estimated Creatinine Clearance: 101 mL/min (by C-G formula based on SCr of 0.81 mg/dL). Liver Function Tests: Recent Labs  Lab 12/05/21 1527 12/08/21 1021  AST 25 23  ALT 17 15  ALKPHOS 48 46  BILITOT 0.8 0.7  PROT 6.7 6.6  ALBUMIN 3.9 3.7   No results for input(s): "LIPASE", "AMYLASE" in the last 168 hours. Recent Labs  Lab 12/06/21 2307  AMMONIA 11    HbA1C: Recent Labs    12/08/21 1021  HGBA1C 5.5   CBG: Recent Labs  Lab 12/09/21 0619 12/09/21 1209 12/09/21 1646 12/09/21 2126 12/10/21 0607  GLUCAP 93 101* 107* 115* 113*     Recent  Results (from the past 240 hour(s))  CSF culture w Gram Stain     Status: None (Preliminary result)   Collection Time: 12/06/21  9:35 PM   Specimen: CSF; Cerebrospinal Fluid  Result Value Ref Range Status   Specimen Description CSF  Final   Special Requests TUBE 2 POSSIBLE CJD  Final   Gram Stain   Final    CYTOSPIN SMEAR WBC PRESENT, PREDOMINANTLY MONONUCLEAR NO ORGANISMS SEEN    Culture   Final    NO GROWTH 3 DAYS Performed at [facility], [address] [place], [place] [address]    Report Status PENDING  Incomplete  Fungus Culture With Stain     Status: None (Preliminary result)   Collection Time: 12/06/21  9:35 PM   Specimen: Lumbar Puncture; Cerebrospinal Fluid  Result  Value Ref Range Status   Fungus Stain Final report  Final    Comment: (NOTE) Performed At: [facility] [address] [place], [place] [id number] Rush Farmer MD YJ:[id number]    Fungus (Mycology) Culture PENDING  Incomplete   Fungal Source CSF  Final    Comment: Performed at [facility], [place] [address] [place], [place] [address]  Culture, fungus without smear     Status: None (Preliminary result)   Collection Time: 12/06/21  9:35 PM   Specimen: CSF; Cerebrospinal Fluid  Result Value Ref Range Status   Specimen Description CSF  Final   Special Requests POSSIBLE CJD  Final   Culture   Final    NO FUNGUS ISOLATED AFTER 2 DAYS Performed at [facility], [place] [address] [place], [place] [address]    Report Status PENDING  Incomplete  Fungus Culture Result     Status: None   Collection Time: 12/06/21  9:35 PM  Result Value Ref Range Status   Result 1 Comment  Final    Comment: (NOTE) KOH/Calcofluor preparation:  no fungus observed. Performed At: [facility] [place], [place] [id number] Rush Farmer MD VE:[id number]   MRSA Next Gen by PCR, Nasal     Status: None   Collection Time: 12/06/21 11:25 PM   Specimen: Nasal Mucosa; Nasal Swab  Result Value Ref Range Status   MRSA by PCR Next Gen NOT DETECTED NOT DETECTED Final    Comment: (NOTE) The GeneXpert MRSA Assay (FDA approved for NASAL specimens only), is one component of a comprehensive MRSA colonization surveillance program. It is not intended to diagnose MRSA infection nor to guide or monitor treatment for MRSA infections. Test performance is not FDA approved in patients less than 83 years old. Performed at [facility], [place] [address], [place], [place] [address]          Radiology Studies: DG CHEST PORT 1 VIEW  Result Date: 12/09/2021 CLINICAL DATA:  Dialysis catheter placement EXAM: PORTABLE CHEST 1 VIEW COMPARISON:  09/15/2013 FINDINGS: Single frontal view  of the chest demonstrates right internal jugular dialysis catheter tip overlying superior vena cava. The cardiac silhouette is unremarkable. Lung volumes are diminished with patchy left basilar consolidation favoring atelectasis. No effusion or pneumothorax. No acute bony abnormalities. IMPRESSION: 1. No complication after right internal jugular dialysis catheter placement. 2. Low lung volumes, with patchy left basilar atelectasis. Electronically Signed   By: Randa Ngo M.D.   On: 12/09/2021 16:26        Scheduled Meds:  busPIRone  15 mg Oral BID   calcium carbonate  2 tablet Oral Q3H  Chlorhexidine Gluconate Cloth  6 each Topical Daily   divalproex  500 mg Oral QHS   heparin sodium (porcine)  1,000 Units Intracatheter Once   insulin aspart  0-15 Units Subcutaneous TID WC   pantoprazole  40 mg Oral Daily   sodium chloride flush  3 mL Intravenous Q12H   Continuous Infusions:  sodium chloride     0.9 % NaCl with KCl 40 mEq / L 75 mL/hr at 12/10/21 0346   albumin human 25 % 50 g in sodium chloride 0.9 %     calcium gluconate     calcium gluconate     citrate dextrose     citrate dextrose     heparin 1,300 Units/hr (12/10/21 0338)     LOS: 4 days    Time spent: 66mns   AVernell Leep MD,  FWithams[place] F[place] [facility] [address] [facility]  [facility] [place]     To contact the attending provider between 7A-7P or the covering provider during after hours 7P-7A, please log into the web site [email or web address] and access using universal [place] password for that web site. If you do not have the password, please call the hospital operator.

## 2021-12-10 NOTE — Progress Notes (Signed)
Neurology Progress Note   Subjective: Seen and examined in the dialysis unit.    Exam: Vitals:   12/10/21 1008 12/10/21 1017  BP:  129/76  Pulse: 60 63  Resp: 19 14  Temp: 97.9 F (36.6 C)   SpO2: 100% 100%   GEN: Lying in bed in restraints, appears uncomfortable and akathisic HEENT: Normocephalic atraumatic Lungs: Clear Cardiovascular: Regular rhythm Abdomen nondistended nontender Neurological exam Neuro: Awake alert, somewhat slow to respond to questions but was able to tell me his name, his age and the fact that he is in [facility].  He was also told me that the month is November. Speech is mildly dysarthric Poor attention concentration No evidence of aphasia Cranial nerves II to XII intact Motor examination with symmetric strength in all 4 extremities Sensation intact.  Pertinent Labs: CBC    Component Value Date/Time   WBC 10.2 12/10/2021 0350   RBC 4.32 12/10/2021 0350   HGB 13.0 12/10/2021 0350   HGB 14.0 07/14/2013 1037   HCT 39.2 12/10/2021 0350   HCT 42.2 07/14/2013 1037   PLT 192 12/10/2021 0350   PLT 171 07/14/2013 1037   MCV 90.7 12/10/2021 0350   MCV 89.0 07/14/2013 1037   MCH 30.1 12/10/2021 0350   MCHC 33.2 12/10/2021 0350   RDW 12.8 12/10/2021 0350   RDW 13.8 07/14/2013 1037   LYMPHSABS 1.3 07/14/2013 1037   MONOABS 0.3 07/14/2013 1037   EOSABS 0.2 07/14/2013 1037   BASOSABS 0.0 07/14/2013 1037       Latest Ref Rng & Units 12/10/2021    9:24 AM 12/10/2021    3:50 AM 12/09/2021   12:20 AM  BMP  Glucose 70 - 99 mg/dL 120  129  136   BUN 8 - 23 mg/dL '22  20  20   '$ Creatinine 0.61 - 1.24 mg/dL 0.70  0.81  0.80   Sodium 135 - 145 mmol/L 143  139  139   Potassium 3.5 - 5.1 mmol/L 3.3  3.6  3.6   Chloride 98 - 111 mmol/L 112  109  110   CO2 22 - 32 mmol/L '21  23  23   '$ Calcium 8.9 - 10.3 mg/dL 8.7  8.7  8.7      Workup summary: Negative CTA chest/abdomen/pelvis with contrast for malignancy screening  LTM negative for  seizures  CSF studies:    WBC 0/0, RBC 0/0, protein and glucose pending due to concern for CJD M/E panel negative (cryptococcus, CMV, enterovirus, initiation coli K1, H. influenzae, HSV-1, HSV-2, HHV-6, human per echovirus, Listeria, Neisseria, Streptococcus agalactiae and pneumoniae, VZV)  Gram stain negative Bacterial culture no growth in 3 days, fungal culture pending  Pending: Beta Isoform Creutzfeldt Jacob disease, 14 3 3, RT quic   Oligoclonal bands, IgG index   Autoimmune encephalitis panel on CSF   Lyme antigen CSF  Serum studies:   TSH normal, ammonia normal, B12 426,  Pending: MMA in process, B1 in process   HIV negative, RPR negative  Antinuclear antibody negative Sjogren's negative Thyroid antibodies pending    Impression:  71 year old man past history of recent COVID infection 3 weeks ago followed by confusion, also has past history of hyperlipidemia, PE, antiphospholipid antibody syndrome on Xarelto who presented with changes in his personality and bizarre behavior since he got COVID.  MRI brain showed cortical riveting restricted diffusion-primarily in the right cerebral hemisphere as well as in bilateral caudate heads and anterior putamen.  EEG with right hemispheric slowing.  CT chest abdomen pelvis no occult malignancy While in the hospital, neurology team observed movements concerning for facial brachial dystonic seizures raising concern for LGI 1 encephalitis, along with other differentials of CJD based on imaging findings. He was started on steroids, initially showed some improvement but then again had agitation and continues to have altered mental status.  Literature review reveals some support for using a second modality such as plasma exchange or IVIG in addition to steroids-given his antiphospholipid antibody syndrome, would avoid IVIG for the risk of hypercoagulability. Discussed with the family about starting plasma exchange which they agreed  to.  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  Subsequent rounds on 12/12/2021, 12/15/2021 12/17/2021 on 12/19/2021-for a total of 5 rounds of plasma exchange, 1 every other day. Neurology will continue to follow with you Plan discussed with Dr. Algis Liming -- Amie Portland, MD Neurologist [facility] Pager: [phone]

## 2021-12-10 NOTE — Progress Notes (Signed)
ANTICOAGULATION CONSULT NOTE  Pharmacy Consult for heparin Indication:  APLS w/ h/o VTE  Allergies  Allergen Reactions   Diphenhydramine     Other reaction(s): prostate swelling, increase urination   Penicillins Other (See Comments)    "childhood reaction from mother"   Trazodone Hcl     Other reaction(s): weak stream    Patient Measurements: Height: '6\' 5"'$  (195.6 cm) Weight: 81.8 kg (180 lb 5.4 oz) IBW/kg (Calculated) : 89.1  Vital Signs: Temp: 98.1 F (36.7 C) (11/15 0350) Temp Source: Oral (11/15 0350) BP: 145/85 (11/15 0350) Pulse Rate: 52 (11/15 0350)  Labs: Recent Labs    12/08/21 1021 12/08/21 2011 12/09/21 0020 12/10/21 0350  HGB 13.7  --  12.5* 13.0  HCT 41.1  --  36.8* 39.2  PLT 235  --  207 192  APTT 72* 79* 80* 100*  HEPARINUNFRC 0.83*  --  0.77* 0.94*  CREATININE 0.87  --  0.80 0.81     Estimated Creatinine Clearance: 101 mL/min (by C-G formula based on SCr of 0.81 mg/dL).   Medical History: Past Medical History:  Diagnosis Date   Hypercholesteremia    Pulmonary embolism (HCC)     Medications:  Medications Prior to Admission  Medication Sig Dispense Refill Last Dose   ascorbic acid (VITAMIN C) 500 MG tablet Take 1,000 mg by mouth daily.   Past Week   bismuth subsalicylate (PEPTO BISMOL) 262 MG/15ML suspension Take 30 mLs by mouth every 6 (six) hours as needed for diarrhea or loose stools.   Past Month   busPIRone (BUSPAR) 7.5 MG tablet Take 15 mg by mouth 2 (two) times daily as needed (anxiety).   12/05/2021   Calcium-Magnesium-Vitamin D (CALCIUM MAGNESIUM PO) Take 5 mLs by mouth daily. Oral liquid   Past Week   Cholecalciferol (VITAMIN D3) 125 MCG (5000 UT) TABS Take 1 tablet by mouth daily.   12/05/2021   IVERMECTIN PO Take 18 mg by mouth daily. Ivermectin oil '10mg'$ /ml suspension.   12/05/2021 at 0900   predniSONE (DELTASONE) 5 MG tablet Take 5-15 mg by mouth See admin instructions. Take 15 mg for 5 days, then take 10 mg for 5 days, and then  take 5 mg for 5 days and then stop.   12/05/2021   Rivaroxaban (XARELTO) 20 MG TABS tablet Take 1 tablet (20 mg total) by mouth daily with supper. Start taking March 29th, 2015 (Patient taking differently: Take 20 mg by mouth daily with supper.) 30 tablet 3 12/04/2021 at 1930   traZODone (DESYREL) 50 MG tablet Take 25 mg by mouth at bedtime as needed for sleep.      zinc gluconate 50 MG tablet Take 50 mg by mouth daily.   12/05/2021   benzonatate (TESSALON) 200 MG capsule Take 200 mg by mouth 3 (three) times daily as needed. (Patient not taking: Reported on 12/06/2021)   Not Taking   dexamethasone (DECADRON) 6 MG tablet Take 6 mg by mouth once. (Patient not taking: Reported on 12/06/2021)   Not Taking   Multiple Vitamin (MULTIVITAMIN WITH MINERALS) TABS tablet Take 1 tablet by mouth daily. (Patient not taking: Reported on 12/06/2021)   Not Taking   omeprazole (PRILOSEC) 20 MG capsule Take 20 mg by mouth daily. (Patient not taking: Reported on 12/06/2021)   Not Taking   saw palmetto 160 MG capsule Take 160 mg by mouth daily. (Patient not taking: Reported on 12/06/2021)   Not Taking   Scheduled:   busPIRone  15 mg Oral BID  calcium carbonate  2 tablet Oral Q3H   Chlorhexidine Gluconate Cloth  6 each Topical Daily   divalproex  500 mg Oral QHS   heparin sodium (porcine)  1,000 Units Intracatheter Once   insulin aspart  0-15 Units Subcutaneous TID WC   pantoprazole  40 mg Oral Daily   sodium chloride flush  3 mL Intravenous Q12H   Infusions:   sodium chloride     0.9 % NaCl with KCl 40 mEq / L 75 mL/hr at 12/10/21 0346   albumin human 25 % 50 g in sodium chloride 0.9 %     calcium gluconate     calcium gluconate     citrate dextrose     citrate dextrose     heparin 1,300 Units/hr (12/10/21 0338)   methylPREDNISolone (SOLU-MEDROL) injection 1,000 mg (12/09/21 0355)    Assessment: 71yo male admitted w/ AMS after Covid Dx, now to start heparin after pt refused PO Xarelto for APLS w/ h/o  VTE; last dose of Xarelto taken 11/9.  Heparin level remains still falsely elevated at 0.94 due to recent DOAC, aPTT continues to be therapeutic at 100, on heparin infusion at 1300 units/hr. Patient now at upper end of goal, will decrease rate slightly. CBC stable. No s/sx of bleeding or infusion issues noted.    Goal of Therapy:  Heparin level 0.3-0.7 units/ml aPTT 66-102 seconds Monitor platelets by anticoagulation protocol: Yes   Plan:  Reduce heparin infusion to 1200 units/hr. Monitor heparin levels, aPTT (while Xarelto affects anti-Xa assay), and CBC.  Erin Hearing PharmD., BCPS Clinical Pharmacist 12/10/2021 8:34 AM

## 2021-12-11 ENCOUNTER — Inpatient Hospital Stay (HOSPITAL_COMMUNITY): Payer: Medicare PPO

## 2021-12-11 DIAGNOSIS — G0490 Encephalitis and encephalomyelitis, unspecified: Secondary | ICD-10-CM | POA: Diagnosis not present

## 2021-12-11 DIAGNOSIS — G9340 Encephalopathy, unspecified: Secondary | ICD-10-CM | POA: Diagnosis not present

## 2021-12-11 DIAGNOSIS — E876 Hypokalemia: Secondary | ICD-10-CM

## 2021-12-11 LAB — CBC
HCT: 40.2 % (ref 39.0–52.0)
Hemoglobin: 13.8 g/dL (ref 13.0–17.0)
MCH: 30.6 pg (ref 26.0–34.0)
MCHC: 34.3 g/dL (ref 30.0–36.0)
MCV: 89.1 fL (ref 80.0–100.0)
Platelets: 174 10*3/uL (ref 150–400)
RBC: 4.51 MIL/uL (ref 4.22–5.81)
RDW: 12.8 % (ref 11.5–15.5)
WBC: 9.6 10*3/uL (ref 4.0–10.5)
nRBC: 0 % (ref 0.0–0.2)

## 2021-12-11 LAB — METHYLMALONIC ACID, SERUM: Methylmalonic Acid, Quantitative: 95 nmol/L (ref 0–378)

## 2021-12-11 LAB — GLUCOSE, CAPILLARY
Glucose-Capillary: 87 mg/dL (ref 70–99)
Glucose-Capillary: 82 mg/dL (ref 70–99)
Glucose-Capillary: 85 mg/dL (ref 70–99)
Glucose-Capillary: 83 mg/dL (ref 70–99)

## 2021-12-11 LAB — BASIC METABOLIC PANEL
Anion gap: 8 (ref 5–15)
BUN: 18 mg/dL (ref 8–23)
CO2: 22 mmol/L (ref 22–32)
Calcium: 8.6 mg/dL — ABNORMAL LOW (ref 8.9–10.3)
Chloride: 108 mmol/L (ref 98–111)
Creatinine, Ser: 0.83 mg/dL (ref 0.61–1.24)
GFR, Estimated: >60 mL/min (ref >60)
Glucose, Bld: 94 mg/dL (ref 70–99)
Potassium: 3.3 mmol/L — ABNORMAL LOW (ref 3.5–5.1)
Sodium: 138 mmol/L (ref 135–145)

## 2021-12-11 LAB — APTT: aPTT: 100 seconds — ABNORMAL HIGH (ref 24–36)

## 2021-12-11 LAB — MAGNESIUM: Magnesium: 2.3 mg/dL (ref 1.7–2.4)

## 2021-12-11 LAB — HEPARIN LEVEL (UNFRACTIONATED): Heparin Unfractionated: 0.39 IU/mL (ref 0.30–0.70)

## 2021-12-11 MED ORDER — POTASSIUM CHLORIDE CRYS ER 10 MEQ PO TBCR: 30.0000 meq | EXTENDED_RELEASE_TABLET | ORAL | Status: DC

## 2021-12-11 MED ORDER — POTASSIUM CHLORIDE 10 MEQ/100ML IV SOLN
10.0000 meq | INTRAVENOUS | Status: AC
  Administered 2021-12-11 (×6): 10 meq via INTRAVENOUS
  Filled 2021-12-11 (×6): qty 100

## 2021-12-11 NOTE — Progress Notes (Signed)
Ativan given prior to MRI.  Pt a bit agitated. Has Haldol ordered for standby.  HR down to 35 and sustained above 40's. DR Sidney Ace and DR Curly Shores is aware. BP has been stable.   MRI completed.

## 2021-12-11 NOTE — Progress Notes (Signed)
PROGRESS NOTE    Spencer Ross  MPN:[id number] DOB: 1950-08-30 DOA: 12/05/2021 PCP: Lujean Amel, MD    Brief Narrative:  71 year old male with a history of antiphospholipid antibody, pulmonary embolism in the past on chronic anticoagulation, recently had COVID-19 infection approximately 3 weeks PTA presented with progressive confusion over the past few weeks. MRI showed cortical restricted diffusion primarily in the right cerebral hemisphere, bilateral caudate heads and anterior putamina.  Neurology following.  He underwent lumbar puncture with several CSF studies in process. Infectious Meningitis/encephalitis panel noted to be negative.  He was started on high-dose IV steroids.  Overall mental status appears to be waxing and waning.  Nontunneled right IJ CVC placed by CCM 11/14.  Started alternate day PLEX 11/15 through 11/24.  Ongoing waxing and waning mental status.   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  Repeat MRI brain with and without contrast on 11/15 without changes compared to prior. -Neurology following and their input much appreciated.  Discussed with Dr. Rory Percy. -Continuous EEG did not show any epileptiform discharges, discontinued.  Neurology checking repeat EEG. -Lumbar puncture performed on 11/11.  Several studies are still currently in process -Completed 5-day course of high-dose Solu-Medrol 1 g daily on 11/15.  -Empirically  treated with acyclovir, but this was discontinued when HSV on CSF was negative -As per neurology, they observed movements concerning for facial brachial dystonic seizures raising concern for LGI 1 encephalitis, along with other differentials of CJD based on imaging findings.  Some initial improvement with steroids but ongoing waxing and waning AMS, thereby started on PLEX.   -Since patient unable to take p.o. consistently from AMS, changed PPI to IV, bedtime Depakote started for mood stabilization to IV as well.  If tolerates p.o. consistently, can change back to oral. -Ongoing waxing and waning mental status.  As per Dr. Rory Percy, startles easily which can be seen with CJD and checking EEG also for corroborative findings.  Work up resulted: -infectious meningitis/encephalitis panel of CSF negative, cryptococcal antigen negative, HIV negative, TSH normal, ammonia normal, B12 426, RPR negative, ANA neg, Sjogren negative, anti Smith, DS DNA neg, CSF fungal culture no growth  Work up pending: -IgG CSF, Oligoclonal bands CSF, Protein/glucose CSF, Beta Isoform (CJD) CSF, Thyroid ab, VDRL CSF.  As per Dr. Rory Percy, many of these results are being directly coordinated with neurology team.   Recent COVID-19 infection -First diagnosed approximately 3 weeks ago -He has been vaccinated -He was taking steroids as well as ivermectin prior to admission -Currently not hypoxic and hemodynamically stable.   Antiphospholipid antibody syndrome -On chronic anticoagulation -Xarelto was on hold for lumbar puncture -Currently on heparin infusion.  Now that s/p LP, consider resuming Xarelto, confirm with neurology regarding no need for further procedures.  Remains on heparin infusion while patient not taking oral consistently.   History of pulmonary embolus -Patient had PE approximately 8 years ago -With underlying antiphospholipid antibody, recommendations are for lifelong anticoagulation -He is anticoagulated with  heparin infusion -Resume Xarelto when able to consistently take p.o. and no  further procedures are planned   Anxiety -Continue on home dose of BuSpar  Asymptomatic sinus bradycardia: Telemetry shows SB in the 40s-50s. TSH normal.  Not on rate control medications.  Monitor on telemetry.  Remained stable.  No pauses or heart blocks noted.  EKG 11/15 shows sinus bradycardia without acute findings.  Hypokalemia: Replace IV and follow.  Magnesium >2.   DVT prophylaxis: Heparin infusion Code Status: Full code Family Communication: Spouse at bedside. Disposition Plan: Status is: Inpatient Remains inpatient appropriate because: Continued work-up and treatment including plasma exchange, of encephalopathy     Consultants:  Neurology PCCM for CVC placement  Procedures:  Lumbar puncture 11/11 RIJ CVC placement 11/14  Antimicrobials:  All discontinued   Subjective: As per spouse at bedside, patient was much alert and interactive when their son came to visit him between 7-8 PM.  He was more oriented and asking questions.  This morning startles easily to touch.  Alert and oriented only to self and spouse.  Not really following instructions.  Objective: Vitals:   12/11/21 0000 12/11/21 0301 12/11/21 0303 12/11/21 0704  BP: (!) 143/68  (!) 149/82 136/80  Pulse: (!) 44  (!) 57 63  Resp: 19  (!) 22 (!) 23  Temp:  98.3 F (36.8 C)  98.3 F (36.8 C)  TempSrc:  Axillary  Axillary  SpO2: 97%  96% 92%  Weight:      Height:        Intake/Output Summary (Last 24 hours) at 12/11/2021 0900 Last data filed at 12/11/2021 0844 Gross per 24 hour  Intake 1499.89 ml  Output 2450 ml  Net -950.11 ml   Filed Weights   12/06/21 2217 12/08/21 0214 12/10/21 0901  Weight: 81.3 kg 81.8 kg 81.8 kg    Examination:  General exam: Young male, moderately built and nourished lying with restraints as described above, in bed, startles easily when nursing attempts to even check vital signs or MD tries  to examine. Respiratory system: Clear to auscultation.  No increased work of breathing. Cardiovascular system: S1 and S2 heard, regular bradycardia.  No murmurs, JVD or pedal edema.  Telemetry personally reviewed: Overnight SB mostly in the 50s, SR in the 70s this morning.  No pauses or heart blocks noted. Gastrointestinal system: Abdomen is nondistended, soft and nontender. No organomegaly or masses felt. Normal bowel sounds heard. Central nervous system: Alert and oriented to self and spouse at bedside.  No cranial nerve deficits appreciated. Extremities: Symmetric 5 x 5 power. Skin: No rashes, lesions or ulcers Psychiatry: Does not appear anxious or agitated.  Rest cannot be assessed due to AMS at this time.    Data Reviewed: I have personally reviewed following labs and imaging studies  CBC: Recent Labs  Lab 12/05/21 1527 12/08/21 1021 12/09/21 0020 12/10/21 0350 12/11/21 0500  WBC 5.1 13.7* 9.8 10.2 9.6  HGB 13.8 13.7 12.5* 13.0 13.8  HCT 41.1 41.1 36.8* 39.2 40.2  MCV 90.7 90.9 89.3 90.7 89.1  PLT 205 235 207 192 338   Basic Metabolic Panel: Recent Labs  Lab 12/08/21 1021 12/09/21 0020 12/10/21 0350 12/10/21 0924 12/11/21 0530  NA 143 139 139 143 138  K 3.2* 3.6 3.6 3.3* 3.3*  CL 111 110 109 112* 108  CO2 '22 23 23 '$ 21* 22  GLUCOSE 94 136* 129* 120* 94  BUN '21 20 20 22 18  '$ CREATININE 0.87 0.80 0.81 0.70 0.83  CALCIUM 9.2 8.7* 8.7* 8.7* 8.6*  MG 2.0  --   --   --  2.3  PHOS 3.5  --   --   --   --    GFR: Estimated Creatinine Clearance: 98.6 mL/min (by C-G formula based on SCr of 0.83 mg/dL). Liver Function Tests: Recent Labs  Lab 12/05/21 1527 12/06/21 2306 12/08/21 1021  AST 25  --  23  ALT 17  --  15  ALKPHOS 48  --  46  BILITOT 0.8  --  0.7  PROT 6.7  --  6.6  ALBUMIN 3.9 NOT PERFORMED 3.7   No results for input(s): "LIPASE", "AMYLASE" in the last 168 hours. Recent Labs  Lab 12/06/21 2307  AMMONIA 11    HbA1C: Recent Labs    12/08/21 1021   HGBA1C 5.5   CBG: Recent Labs  Lab 12/09/21 2126 12/10/21 0607 12/10/21 1754 12/10/21 2312 12/11/21 0627  GLUCAP 115* 113* 83 93 87     Recent Results (from the past 240 hour(s))  CSF culture w Gram Stain     Status: None   Collection Time: 12/06/21  9:35 PM   Specimen: CSF; Cerebrospinal Fluid  Result Value Ref Range Status   Specimen Description CSF  Final   Special Requests TUBE 2 POSSIBLE CJD  Final   Gram Stain   Final    CYTOSPIN SMEAR WBC PRESENT, PREDOMINANTLY MONONUCLEAR NO ORGANISMS SEEN    Culture   Final    NO GROWTH 3 DAYS Performed at [facility], [place] [address], [place], [place] [address]    Report Status 12/10/2021 FINAL  Final  Fungus Culture With Stain     Status: None (Preliminary result)   Collection Time: 12/06/21  9:35 PM   Specimen: Lumbar Puncture; Cerebrospinal Fluid  Result Value Ref Range Status   Fungus Stain Final report  Final    Comment: (NOTE) Performed At: [facility] [address] [place], [place] [id number] Rush Farmer MD IR:[id number]    Fungus (Mycology) Culture PENDING  Incomplete   Fungal Source CSF  Final    Comment: Performed at [facility], [place] [address], [place], [place] [address]  Culture, fungus without smear     Status: None (Preliminary result)   Collection Time: 12/06/21  9:35 PM   Specimen: CSF; Cerebrospinal Fluid  Result Value Ref Range Status   Specimen Description CSF  Final   Special Requests POSSIBLE CJD  Final   Culture   Final    NO GROWTH 4 DAYS Performed at [facility], [place] [address], [place], [place] [address]    Report Status PENDING  Incomplete  Fungus Culture Result     Status: None   Collection Time: 12/06/21  9:35 PM  Result Value Ref Range Status   Result 1 Comment  Final    Comment: (NOTE) KOH/Calcofluor preparation:  no fungus observed. Performed At: [facility] [place], [place] [id number] Rush Farmer MD FY:[id number]    MRSA Next Gen by PCR, Nasal     Status: None   Collection Time: 12/06/21 11:25 PM   Specimen: Nasal Mucosa; Nasal Swab  Result Value Ref Range Status   MRSA by PCR Next Gen NOT DETECTED NOT DETECTED Final    Comment: (NOTE) The GeneXpert MRSA Assay (FDA approved for NASAL specimens only), is one component of a comprehensive MRSA colonization surveillance program. It is not intended to diagnose MRSA infection nor to guide or monitor treatment for MRSA infections. Test performance is not FDA approved in patients less than 30 years old. Performed  at [facility], [place] [address] [place], [place] [address]          Radiology Studies: MR BRAIN W WO CONTRAST  Result Date: 12/10/2021 CLINICAL DATA:  Delirium EXAM: MRI HEAD WITHOUT AND WITH CONTRAST TECHNIQUE: Multiplanar, multiecho pulse sequences of the brain and surrounding structures were obtained without and with intravenous contrast. CONTRAST:  33m GADAVIST GADOBUTROL 1 MMOL/ML IV SOLN COMPARISON:  12/05/2021 FINDINGS: Brain: Unchanged multifocal abnormal cortical diffusion restriction in the right hemisphere, most evident right parietal lobe. No acute or chronic hemorrhage. Minimal multifocal hyperintense T2-weight signal within the white matter. Mild diffusion restriction also noted within the corpora striata, unchanged. Vascular: Major flow voids are preserved. Skull and upper cervical spine: Normal calvarium and skull base. Visualized upper cervical spine and soft tissues are normal. Sinuses/Orbits:No paranasal sinus fluid levels or advanced mucosal thickening. No mastoid or middle ear effusion. Normal orbits. IMPRESSION: 1. Unchanged diffusion abnormalities of the right hemispheric cortex and the corpora striata. Infectious or inflammatory encephalitides remains of primary concern Electronically Signed   By: KUlyses JarredM.D.   On: 12/10/2021 23:18   DG CHEST PORT 1 VIEW  Result Date: 12/09/2021 CLINICAL DATA:  Dialysis  catheter placement EXAM: PORTABLE CHEST 1 VIEW COMPARISON:  09/15/2013 FINDINGS: Single frontal view of the chest demonstrates right internal jugular dialysis catheter tip overlying superior vena cava. The cardiac silhouette is unremarkable. Lung volumes are diminished with patchy left basilar consolidation favoring atelectasis. No effusion or pneumothorax. No acute bony abnormalities. IMPRESSION: 1. No complication after right internal jugular dialysis catheter placement. 2. Low lung volumes, with patchy left basilar atelectasis. Electronically Signed   By: MRanda NgoM.D.   On: 12/09/2021 16:26        Scheduled Meds:  Chlorhexidine Gluconate Cloth  6 each Topical Daily   pantoprazole (PROTONIX) IV  40 mg Intravenous Q24H   sodium chloride flush  3 mL Intravenous Q12H   Continuous Infusions:  sodium chloride     0.9 % NaCl with KCl 40 mEq / L 75 mL/hr at 12/11/21 0454   heparin 1,200 Units/hr (12/10/21 2321)   potassium chloride 10 mEq (12/11/21 0827)   valproate sodium 500 mg (12/10/21 2345)     LOS: 5 days    Time spent: 365ms   AnVernell LeepMD,  FACP, FHBallard Rehabilitation HospSFSouth County [facility] ServicesCMSouth [facility] Surgery CenterCHBrant [facility]   To contact the attending provider between 7A-7P or the covering provider during after hours 7P-7A, please log into the web site [email or web address] and access using universal [place] password for that web site. If you do not have the password, please call the hospital operator.

## 2021-12-11 NOTE — Progress Notes (Signed)
ANTICOAGULATION CONSULT NOTE  Pharmacy Consult for heparin Indication:  APLS w/ h/o VTE  Allergies  Allergen Reactions   Diphenhydramine     Other reaction(s): prostate swelling, increase urination   Penicillins Other (See Comments)    "childhood reaction from mother"   Trazodone Hcl     Other reaction(s): weak stream    Patient Measurements: Height: '6\' 5"'$  (195.6 cm) Weight: 81.8 kg (180 lb 5.4 oz) IBW/kg (Calculated) : 89.1  Vital Signs: Temp: 98.3 F (36.8 C) (11/16 0704) Temp Source: Axillary (11/16 0704) BP: 136/80 (11/16 0704) Pulse Rate: 63 (11/16 0704)  Labs: Recent Labs    12/09/21 0020 12/10/21 0350 12/10/21 0924 12/11/21 0500 12/11/21 0530  HGB 12.5* 13.0  --  13.8  --   HCT 36.8* 39.2  --  40.2  --   PLT 207 192  --  174  --   APTT 80* 100*  --  100*  --   HEPARINUNFRC 0.77* 0.94*  --  0.39  --   CREATININE 0.80 0.81 0.70  --  0.83     Estimated Creatinine Clearance: 98.6 mL/min (by C-G formula based on SCr of 0.83 mg/dL).   Medical History: Past Medical History:  Diagnosis Date   Hypercholesteremia    Pulmonary embolism (HCC)     Medications:  Medications Prior to Admission  Medication Sig Dispense Refill Last Dose   ascorbic acid (VITAMIN C) 500 MG tablet Take 1,000 mg by mouth daily.   Past Week   bismuth subsalicylate (PEPTO BISMOL) 262 MG/15ML suspension Take 30 mLs by mouth every 6 (six) hours as needed for diarrhea or loose stools.   Past Month   busPIRone (BUSPAR) 7.5 MG tablet Take 15 mg by mouth 2 (two) times daily as needed (anxiety).   12/05/2021   Calcium-Magnesium-Vitamin D (CALCIUM MAGNESIUM PO) Take 5 mLs by mouth daily. Oral liquid   Past Week   Cholecalciferol (VITAMIN D3) 125 MCG (5000 UT) TABS Take 1 tablet by mouth daily.   12/05/2021   IVERMECTIN PO Take 18 mg by mouth daily. Ivermectin oil '10mg'$ /ml suspension.   12/05/2021 at 0900   predniSONE (DELTASONE) 5 MG tablet Take 5-15 mg by mouth See admin instructions. Take 15  mg for 5 days, then take 10 mg for 5 days, and then take 5 mg for 5 days and then stop.   12/05/2021   Rivaroxaban (XARELTO) 20 MG TABS tablet Take 1 tablet (20 mg total) by mouth daily with supper. Start taking March 29th, 2015 (Patient taking differently: Take 20 mg by mouth daily with supper.) 30 tablet 3 12/04/2021 at 1930   traZODone (DESYREL) 50 MG tablet Take 25 mg by mouth at bedtime as needed for sleep.      zinc gluconate 50 MG tablet Take 50 mg by mouth daily.   12/05/2021   benzonatate (TESSALON) 200 MG capsule Take 200 mg by mouth 3 (three) times daily as needed. (Patient not taking: Reported on 12/06/2021)   Not Taking   dexamethasone (DECADRON) 6 MG tablet Take 6 mg by mouth once. (Patient not taking: Reported on 12/06/2021)   Not Taking   Multiple Vitamin (MULTIVITAMIN WITH MINERALS) TABS tablet Take 1 tablet by mouth daily. (Patient not taking: Reported on 12/06/2021)   Not Taking   omeprazole (PRILOSEC) 20 MG capsule Take 20 mg by mouth daily. (Patient not taking: Reported on 12/06/2021)   Not Taking   saw palmetto 160 MG capsule Take 160 mg by mouth daily. (Patient not taking:  Reported on 12/06/2021)   Not Taking   Scheduled:   Chlorhexidine Gluconate Cloth  6 each Topical Daily   pantoprazole (PROTONIX) IV  40 mg Intravenous Q24H   sodium chloride flush  3 mL Intravenous Q12H   Infusions:   sodium chloride     0.9 % NaCl with KCl 40 mEq / L 75 mL/hr at 12/11/21 0454   heparin 1,200 Units/hr (12/10/21 2321)   potassium chloride     valproate sodium 500 mg (12/10/21 2345)    Assessment: 71yo male admitted w/ AMS after Covid Dx, now to start heparin after pt refused PO Xarelto for APLS w/ h/o VTE; last dose of Xarelto taken 11/9.  Heparin level 0.39 no correlating with aptt 100sec - showing rivaroxaban clearance  will dose heparin via heparin level moving forward.  Heparin drip 1200 uts/hr. CBC stable. No s/sx of bleeding or infusion issues noted.    Goal of Therapy:   Heparin level 0.3-0.7 units/ml aPTT 66-102 seconds Monitor platelets by anticoagulation protocol: Yes   Plan:  heparin infusion 1200 units/hr. Daily heparin levels, and CBC  Monitor s/s bleeding     Bonnita Nasuti Pharm.D. CPP, BCPS Clinical Pharmacist [phone] 12/11/2021 7:37 AM

## 2021-12-11 NOTE — Progress Notes (Signed)
Neurology Progress Note   Subjective: Seen and examined - startled very easy this morning when I woke him up.    Exam: Vitals:   12/11/21 0303 12/11/21 0704  BP: (!) 149/82 136/80  Pulse: (!) 57 63  Resp: (!) 22 (!) 23  Temp:  98.3 F (36.8 C)  SpO2: 96% 92%   GEN: Lying in bed in restraints, appears uncomfortable and akathisic HEENT: Normocephalic atraumatic Lungs: Clear Cardiovascular: Regular rhythm Abdomen nondistended nontender Neurological exam Neuro: Morning examination was not awake.  When I woke him up, he had a very prominent startle. He did not follow commands He was nonverbal Cranial nerves unremarkable Motor exam with no discernible weakness   Pertinent Labs: CBC    Component Value Date/Time   WBC 9.6 12/11/2021 0500   RBC 4.51 12/11/2021 0500   HGB 13.8 12/11/2021 0500   HGB 14.0 07/14/2013 1037   HCT 40.2 12/11/2021 0500   HCT 42.2 07/14/2013 1037   PLT 174 12/11/2021 0500   PLT 171 07/14/2013 1037   MCV 89.1 12/11/2021 0500   MCV 89.0 07/14/2013 1037   MCH 30.6 12/11/2021 0500   MCHC 34.3 12/11/2021 0500   RDW 12.8 12/11/2021 0500   RDW 13.8 07/14/2013 1037   LYMPHSABS 1.3 07/14/2013 1037   MONOABS 0.3 07/14/2013 1037   EOSABS 0.2 07/14/2013 1037   BASOSABS 0.0 07/14/2013 1037       Latest Ref Rng & Units 12/11/2021    5:30 AM 12/10/2021    9:24 AM 12/10/2021    3:50 AM  BMP  Glucose 70 - 99 mg/dL 94  120  129   BUN 8 - 23 mg/dL '18  22  20   '$ Creatinine 0.61 - 1.24 mg/dL 0.83  0.70  0.81   Sodium 135 - 145 mmol/L 138  143  139   Potassium 3.5 - 5.1 mmol/L 3.3  3.3  3.6   Chloride 98 - 111 mmol/L 108  112  109   CO2 22 - 32 mmol/L '22  21  23   '$ Calcium 8.9 - 10.3 mg/dL 8.6  8.7  8.7      Workup summary: Negative CTA chest/abdomen/pelvis with contrast for malignancy screening  LTM negative for seizures  CSF studies:    WBC 0/0, RBC 0/0, protein and glucose pending due to concern for CJD M/E panel negative (cryptococcus, CMV,  enterovirus, initiation coli K1, H. influenzae, HSV-1, HSV-2, HHV-6, human per echovirus, Listeria, Neisseria, Streptococcus agalactiae and pneumoniae, VZV)  Gram stain negative Bacterial culture no growth in 3 days, fungal culture pending  Pending: Beta Isoform Creutzfeldt Jacob disease, 14 3 3, RT quic   Oligoclonal bands, IgG index   Autoimmune encephalitis panel on CSF   Lyme antigen CSF  Serum studies:   TSH normal, ammonia normal, B12 426,  Pending: MMA in process, B1 in process   HIV negative, RPR negative  Antinuclear antibody negative Sjogren's negative Thyroid antibodies pending    Impression:  71 year old man past history of recent COVID infection 3 weeks ago followed by confusion, also has past history of hyperlipidemia, PE, antiphospholipid antibody syndrome on Xarelto who presented with changes in his personality and bizarre behavior since he got COVID.  MRI brain showed cortical ribbon restricted diffusion-primarily in the right cerebral hemisphere as well as in bilateral caudate heads and anterior putamen.  EEG with right hemispheric slowing.  CT chest abdomen pelvis no occult malignancy While in the hospital, neurology team observed movements concerning for facial brachial dystonic  seizures raising concern for LGI 1 encephalitis, along with other differentials of CJD based on imaging findings. He was started on steroids, initially showed some improvement but then again had agitation and continues to have altered mental status.  Literature review reveals some support for using a second modality such as plasma exchange or IVIG in addition to steroids-given his antiphospholipid antibody syndrome, would avoid IVIG for the risk of hypercoagulability. Discussed with the family about starting plasma exchange which they agreed to.  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   Recommendations: - Pulse dose steroids, completed  11/11 - 11/15 - Monitor BG while on steroids  - Continue PPI while on steroids  -Plasma exchange-first round on 12/10/2021.  Subsequent rounds on 12/12/2021, 12/15/2021 12/17/2021 on 12/19/2021-for a total of 5 rounds of plasma exchange, 1 every other day. Neurology will continue to follow with you -repeat EEG to look for any evidence of PSWC given easy startling on exam.  Plan discussed with Dr. Algis Liming -- Amie Portland, MD Neurologist [facility] Pager: [phone]

## 2021-12-11 NOTE — Progress Notes (Signed)
EEG complete - results pending 

## 2021-12-12 ENCOUNTER — Encounter (HOSPITAL_COMMUNITY): Payer: Self-pay | Admitting: Internal Medicine

## 2021-12-12 DIAGNOSIS — G9340 Encephalopathy, unspecified: Secondary | ICD-10-CM | POA: Diagnosis not present

## 2021-12-12 DIAGNOSIS — G0490 Encephalitis and encephalomyelitis, unspecified: Secondary | ICD-10-CM | POA: Diagnosis not present

## 2021-12-12 LAB — GLUCOSE, CAPILLARY
Glucose-Capillary: 82 mg/dL (ref 70–99)
Glucose-Capillary: 86 mg/dL (ref 70–99)
Glucose-Capillary: 89 mg/dL (ref 70–99)
Glucose-Capillary: 93 mg/dL (ref 70–99)

## 2021-12-12 LAB — CSF CELL COUNT WITH DIFFERENTIAL
Eosinophils, CSF: 0 % (ref 0–1)
Eosinophils, CSF: 0 % (ref 0–1)
Lymphs, CSF: 0 % — ABNORMAL LOW (ref 40–80)
Lymphs, CSF: 0 % — ABNORMAL LOW (ref 40–80)
Monocyte-Macrophage-Spinal Fluid: 0 % — ABNORMAL LOW (ref 15–45)
Monocyte-Macrophage-Spinal Fluid: 0 % — ABNORMAL LOW (ref 15–45)
RBC Count, CSF: 0 /mm3
RBC Count, CSF: 3 /mm3 — ABNORMAL HIGH
Segmented Neutrophils-CSF: 0 % (ref 0–6)
Segmented Neutrophils-CSF: 0 % (ref 0–6)
Tube #: 1
Tube #: 1
WBC, CSF: 0 /mm3 (ref 0–5)
WBC, CSF: 0 /mm3 (ref 0–5)

## 2021-12-12 LAB — BASIC METABOLIC PANEL
Anion gap: 9 (ref 5–15)
BUN: 19 mg/dL (ref 8–23)
CO2: 24 mmol/L (ref 22–32)
Calcium: 8.9 mg/dL (ref 8.9–10.3)
Chloride: 112 mmol/L — ABNORMAL HIGH (ref 98–111)
Creatinine, Ser: 1.2 mg/dL (ref 0.61–1.24)
GFR, Estimated: >60 mL/min (ref >60)
Glucose, Bld: 106 mg/dL — ABNORMAL HIGH (ref 70–99)
Potassium: 4 mmol/L (ref 3.5–5.1)
Sodium: 145 mmol/L (ref 135–145)

## 2021-12-12 LAB — RENAL FUNCTION PANEL
Albumin: 3.6 g/dL (ref 3.5–5.0)
Anion gap: 11 (ref 5–15)
BUN: 17 mg/dL (ref 8–23)
CO2: 22 mmol/L (ref 22–32)
Calcium: 8.9 mg/dL (ref 8.9–10.3)
Chloride: 111 mmol/L (ref 98–111)
Creatinine, Ser: 0.99 mg/dL (ref 0.61–1.24)
GFR, Estimated: >60 mL/min (ref >60)
Glucose, Bld: 96 mg/dL (ref 70–99)
Phosphorus: 4.6 mg/dL (ref 2.5–4.6)
Potassium: 3.8 mmol/L (ref 3.5–5.1)
Sodium: 144 mmol/L (ref 135–145)

## 2021-12-12 LAB — CBC
HCT: 40.5 % (ref 39.0–52.0)
Hemoglobin: 13.8 g/dL (ref 13.0–17.0)
MCH: 30.4 pg (ref 26.0–34.0)
MCHC: 34.1 g/dL (ref 30.0–36.0)
MCV: 89.2 fL (ref 80.0–100.0)
Platelets: 167 10*3/uL (ref 150–400)
RBC: 4.54 MIL/uL (ref 4.22–5.81)
RDW: 13 % (ref 11.5–15.5)
WBC: 9 10*3/uL (ref 4.0–10.5)
nRBC: 0 % (ref 0.0–0.2)

## 2021-12-12 LAB — HEPARIN LEVEL (UNFRACTIONATED): Heparin Unfractionated: 0.41 IU/mL (ref 0.30–0.70)

## 2021-12-12 LAB — IGG CSF INDEX
Albumin CSF-mCnc: 16 mg/dL (ref 15–55)
Albumin: 4.4 g/dL (ref 3.9–4.9)
CSF IgG Index: 0.5 (ref 0.0–0.7)
IgG (Immunoglobin G), Serum: 1331 mg/dL (ref 603–1613)
IgG, CSF: 2.4 mg/dL (ref 0.0–10.3)
IgG/Alb Ratio, CSF: 0.15 (ref 0.00–0.25)

## 2021-12-12 LAB — VDRL, CSF: VDRL Quant, CSF: NONREACTIVE

## 2021-12-12 LAB — AMMONIA: Ammonia: 24 umol/L (ref 9–35)

## 2021-12-12 LAB — VALPROIC ACID LEVEL: Valproic Acid Lvl: 28 ug/mL — ABNORMAL LOW (ref 50.0–100.0)

## 2021-12-12 MED ORDER — ACETAMINOPHEN 325 MG PO TABS: 650.0000 mg | ORAL_TABLET | ORAL | Status: DC | PRN

## 2021-12-12 MED ORDER — CALCIUM CARBONATE ANTACID 500 MG PO CHEW
2.0000 | CHEWABLE_TABLET | ORAL | Status: DC
  Administered 2021-12-12: 400 mg via ORAL
  Filled 2021-12-12: qty 2

## 2021-12-12 MED ORDER — DIPHENHYDRAMINE HCL 25 MG PO CAPS: 25.0000 mg | ORAL_CAPSULE | Freq: Four times a day (QID) | ORAL | Status: DC | PRN

## 2021-12-12 MED ORDER — VALPROATE SODIUM 100 MG/ML IV SOLN
1000.0000 mg | Freq: Once | INTRAVENOUS | Status: AC
  Administered 2021-12-12: 1000 mg via INTRAVENOUS
  Filled 2021-12-12: qty 10

## 2021-12-12 MED ORDER — VALPROATE SODIUM 100 MG/ML IV SOLN
500.0000 mg | Freq: Two times a day (BID) | INTRAVENOUS | Status: DC
  Administered 2021-12-13 – 2021-12-20 (×16): 500 mg via INTRAVENOUS
  Filled 2021-12-12 (×19): qty 5

## 2021-12-12 MED ORDER — SODIUM CHLORIDE 0.9 % IV SOLN
INTRAVENOUS | Status: AC
  Filled 2021-12-12 (×4): qty 200

## 2021-12-12 MED ORDER — LORAZEPAM 2 MG/ML IJ SOLN: 1.0000 mg | INTRAMUSCULAR | Status: DC | PRN

## 2021-12-12 MED ORDER — HEPARIN SODIUM (PORCINE) 1000 UNIT/ML IJ SOLN
INTRAMUSCULAR | Status: AC
  Filled 2021-12-12: qty 3

## 2021-12-12 MED ORDER — VALPROATE SODIUM 100 MG/ML IV SOLN: 500.0000 mg | Freq: Two times a day (BID) | INTRAVENOUS | Status: DC

## 2021-12-12 MED ORDER — CALCIUM GLUCONATE-NACL 2-0.675 GM/100ML-% IV SOLN
2.0000 g | Freq: Once | INTRAVENOUS | Status: AC
  Administered 2021-12-12: 2000 mg via INTRAVENOUS
  Filled 2021-12-12: qty 100

## 2021-12-12 MED ORDER — ACD FORMULA A 0.73-2.45-2.2 GM/100ML VI SOLN
1000.0000 mL | Status: DC
  Administered 2021-12-12: 1000 mL
  Filled 2021-12-12: qty 1000

## 2021-12-12 MED ORDER — HEPARIN SODIUM (PORCINE) 1000 UNIT/ML IJ SOLN: 1000.0000 [IU] | Freq: Once | INTRAMUSCULAR | Status: DC

## 2021-12-12 NOTE — Progress Notes (Signed)
  Transition of Care [facility]) Screening Note   Patient Details  Name: Spencer Ross Date of Birth: 1950-06-03   [facility]) CM/SW Contact:    Pollie Friar, RN Phone Number: 12/12/2021, 11:17 AM   Pt is from home with spouse. Pt receiving plasmapheresis through 12/19/2021.  Transition of Care Department [facility]) has reviewed patient. We will continue to monitor patient advancement through interdisciplinary progression rounds. If new patient transition needs arise, please place a TOC consult.

## 2021-12-12 NOTE — Progress Notes (Signed)
ANTICOAGULATION CONSULT NOTE  Pharmacy Consult for heparin Indication:  APLS w/ h/o VTE  Allergies  Allergen Reactions   Diphenhydramine     Other reaction(s): prostate swelling, increase urination   Penicillins Other (See Comments)    "childhood reaction from mother"   Trazodone Hcl     Other reaction(s): weak stream    Patient Measurements: Height: '6\' 5"'$  (195.6 cm) Weight: 81.8 kg (180 lb 5.4 oz) IBW/kg (Calculated) : 89.1  Vital Signs: Temp: 98.7 F (37.1 C) (11/17 0304) Temp Source: Oral (11/17 0304) BP: 140/95 (11/17 0304) Pulse Rate: 62 (11/17 0304)  Labs: Recent Labs    12/10/21 0350 12/10/21 0924 12/11/21 0500 12/11/21 0530 12/12/21 0310  HGB 13.0  --  13.8  --  13.8  HCT 39.2  --  40.2  --  40.5  PLT 192  --  174  --  167  APTT 100*  --  100*  --   --   HEPARINUNFRC 0.94*  --  0.39  --  0.41  CREATININE 0.81 0.70  --  0.83 0.99     Estimated Creatinine Clearance: 82.6 mL/min (by C-G formula based on SCr of 0.99 mg/dL).   Medical History: Past Medical History:  Diagnosis Date   Hypercholesteremia    Pulmonary embolism (HCC)     Medications:  Medications Prior to Admission  Medication Sig Dispense Refill Last Dose   ascorbic acid (VITAMIN C) 500 MG tablet Take 1,000 mg by mouth daily.   Past Week   bismuth subsalicylate (PEPTO BISMOL) 262 MG/15ML suspension Take 30 mLs by mouth every 6 (six) hours as needed for diarrhea or loose stools.   Past Month   busPIRone (BUSPAR) 7.5 MG tablet Take 15 mg by mouth 2 (two) times daily as needed (anxiety).   12/05/2021   Calcium-Magnesium-Vitamin D (CALCIUM MAGNESIUM PO) Take 5 mLs by mouth daily. Oral liquid   Past Week   Cholecalciferol (VITAMIN D3) 125 MCG (5000 UT) TABS Take 1 tablet by mouth daily.   12/05/2021   IVERMECTIN PO Take 18 mg by mouth daily. Ivermectin oil '10mg'$ /ml suspension.   12/05/2021 at 0900   predniSONE (DELTASONE) 5 MG tablet Take 5-15 mg by mouth See admin instructions. Take 15 mg for  5 days, then take 10 mg for 5 days, and then take 5 mg for 5 days and then stop.   12/05/2021   Rivaroxaban (XARELTO) 20 MG TABS tablet Take 1 tablet (20 mg total) by mouth daily with supper. Start taking March 29th, 2015 (Patient taking differently: Take 20 mg by mouth daily with supper.) 30 tablet 3 12/04/2021 at 1930   traZODone (DESYREL) 50 MG tablet Take 25 mg by mouth at bedtime as needed for sleep.      zinc gluconate 50 MG tablet Take 50 mg by mouth daily.   12/05/2021   benzonatate (TESSALON) 200 MG capsule Take 200 mg by mouth 3 (three) times daily as needed. (Patient not taking: Reported on 12/06/2021)   Not Taking   dexamethasone (DECADRON) 6 MG tablet Take 6 mg by mouth once. (Patient not taking: Reported on 12/06/2021)   Not Taking   Multiple Vitamin (MULTIVITAMIN WITH MINERALS) TABS tablet Take 1 tablet by mouth daily. (Patient not taking: Reported on 12/06/2021)   Not Taking   omeprazole (PRILOSEC) 20 MG capsule Take 20 mg by mouth daily. (Patient not taking: Reported on 12/06/2021)   Not Taking   saw palmetto 160 MG capsule Take 160 mg by mouth daily. (Patient  not taking: Reported on 12/06/2021)   Not Taking   Scheduled:   Chlorhexidine Gluconate Cloth  6 each Topical Daily   pantoprazole (PROTONIX) IV  40 mg Intravenous Q24H   sodium chloride flush  3 mL Intravenous Q12H   Infusions:   sodium chloride     0.9 % NaCl with KCl 40 mEq / L 75 mL/hr at 12/11/21 2252   heparin 1,200 Units/hr (12/12/21 0438)   valproate sodium 500 mg (12/11/21 2334)    Assessment: 71yo male admitted w/ AMS after Covid Dx, now to start heparin after pt refused PO Xarelto for APLS w/ h/o VTE; last dose of Xarelto taken 11/9.  Heparin level 0.39 no correlating with aptt 100sec - showing rivaroxaban clearance  will dose heparin via heparin level moving forward.  Heparin drip 1200 uts/hr. CBC stable. No s/sx of bleeding or infusion issues noted.   12/12/2021 AM update  HL 0.41   CBC wnl and  stable   Goal of Therapy:  Heparin level 0.3-0.7 units/ml aPTT 66-102 seconds Monitor platelets by anticoagulation protocol: Yes   Plan:  Continue heparin infusion 1200 units/hr. Daily heparin levels, and CBC  Monitor s/s bleeding     Vaughan Basta BS, PharmD, BCPS Clinical Pharmacist 12/12/2021 5:49 AM  Contact: [phone] after 3 PM  "Be curious, not judgmental..." -Jamal Maes

## 2021-12-12 NOTE — Plan of Care (Signed)
Pt is confused and doesn't verbally interact with staff. No evidence of learning. Pt continues to have bilat wrist restraints and bilat mitts intact. 1:1 sitter at bedside. Pt has napped briefly tonight. Constantly moving around in bed. Pt swats at staff when they touch him or when I touch his CVL or right side of neck.

## 2021-12-12 NOTE — Progress Notes (Signed)
TPE treatment 2 out of 5 completed without complications. Post VS: B/P-133/81, HR-73, R-19. T-98.2, O2-98% RA. Next treatment scheduled for Monday, 11/20.  Report given to primary RN.

## 2021-12-12 NOTE — Progress Notes (Signed)
Called to rm by NT who is 1:1 with pt. NT stated pt had a witnessed 45 sec seizure where his eyes rolled back for about 20 sec and then his neck became hyperextended for another 20-25 sec. NO Tonic/Clonic movements noted. Pt back to baseline after seizure activity. VS charted. Secure chat sent to Dr. Sidney Ace.

## 2021-12-12 NOTE — Progress Notes (Signed)
PROGRESS NOTE    Spencer Ross  TKP:[id number] DOB: [birth year] DOA: 12/05/2021 PCP: Lujean Amel, MD    Brief Narrative:  71 year old male with a history of antiphospholipid antibody, pulmonary embolism in the past on chronic anticoagulation, recently had COVID-19 infection approximately 3 weeks PTA presented with progressive confusion over the past few weeks. MRI showed cortical restricted diffusion primarily in the right cerebral hemisphere, bilateral caudate heads and anterior putamina.  Neurology following.  He underwent lumbar puncture with several CSF studies in process. Infectious Meningitis/encephalitis panel noted to be negative.  He was started on high-dose IV steroids.  Overall mental status appears to be waxing and waning.  Nontunneled right IJ CVC placed by CCM 11/14.  Started alternate day plasma pheresis 11/15 through 11/24.  Ongoing waxing and waning mental status.  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  Repeat MRI brain with and without contrast on 11/15 without changes compared to prior. -Neurology following and their input much appreciated.  Discussed with Dr. Rory Percy. -Continuous EEG did not show any epileptiform discharges, discontinued.  Neurology checking repeat EEG - remains abnormal with lateralized discharges of the R hemisphere. -Lumbar puncture performed on 11/11.  Several studies are still currently in  process -Completed 5-day course of high-dose Solu-Medrol 1 g daily on 11/15.  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  As per Dr. Rory Percy, many of these results are being directly coordinated with neurology team.  Acute metabolic encephalopathy Dysphagia -Secondary to above -Medications transitioned to IV given inconsistent PO intake  Antiphospholipid antibody syndrome History of PE -On chronic anticoagulation -Xarelto was on hold for lumbar puncture -Currently on heparin infusion. Restart Xarelto in 24-48h once oral route established and no further procedures are indicated   Anxiety -Continue on home dose of BuSpar  Asymptomatic sinus bradycardia: Telemetry shows SB in the 40s-50s. TSH normal.   No rate control medications. Clinically stable  Hypokalemia, resolved: Follow repeat labs  Recent COVID-19 infection -First diagnosed approximately 3 weeks ago -He has been vaccinated -He was taking steroids as well as ivermectin prior to admission -Currently not hypoxic and hemodynamically stable.  DVT prophylaxis: Heparin infusion Code Status: Full code Family Communication: Spouse previously updated at bedside Disposition Plan: Status is: Inpatient Remains inpatient appropriate because: Continued work-up and treatment including plasma exchange, of encephalopathy  Consultants:  Neurology PCCM for CVC  placement  Procedures:  Lumbar puncture 11/11 RIJ CVC placement 11/14  Antimicrobials:  All discontinued   Subjective: No acute issues/events noted overnight, ROS limited   Objective: Vitals:   12/11/21 1935 12/12/21 0057 12/12/21 0304  12/12/21 0713  BP: (!) 159/82 139/88 (!) 140/95 135/87  Pulse: 65 69 62 81  Resp: 20   20  Temp: 99.2 F (37.3 C)  98.7 F (37.1 C) 98.5 F (36.9 C)  TempSrc: Axillary  Oral Axillary  SpO2: 97%   96%  Weight:      Height:        Intake/Output Summary (Last 24 hours) at 12/12/2021 0728 Last data filed at 12/12/2021 5102 Gross per 24 hour  Intake 120 ml  Output 3385 ml  Net -3265 ml    Filed Weights   12/06/21 2217 12/08/21 0214 12/10/21 0901  Weight: 81.3 kg 81.8 kg 81.8 kg    Examination:  General exam: Young male, moderately built and nourished lying with restraints as described above, in bed, startles easily when nursing attempts to even check vital signs or MD tries to examine. Respiratory system: Clear to auscultation.  No increased work of breathing. Cardiovascular system: S1 and S2 heard, regular bradycardia.  No murmurs, JVD or pedal edema.  Telemetry personally reviewed: Overnight SB mostly in the 50s, SR in the 70s this morning.  No pauses or heart blocks noted. Gastrointestinal system: Abdomen is nondistended, soft and nontender. No organomegaly or masses felt. Normal bowel sounds heard. Central nervous system: Alert and oriented to self and spouse at bedside.  No cranial nerve deficits appreciated. Extremities: Symmetric 5 x 5 power. Skin: No rashes, lesions or ulcers Psychiatry: Does not appear anxious or agitated.  Rest cannot be assessed due to AMS at this time.    Data Reviewed: I have personally reviewed following labs and imaging studies  CBC: Recent Labs  Lab 12/08/21 1021 12/09/21 0020 12/10/21 0350 12/11/21 0500 12/12/21 0310  WBC 13.7* 9.8 10.2 9.6 9.0  HGB 13.7 12.5* 13.0 13.8 13.8  HCT 41.1 36.8*  39.2 40.2 40.5  MCV 90.9 89.3 90.7 89.1 89.2  PLT 235 207 192 174 585    Basic Metabolic Panel: Recent Labs  Lab 12/08/21 1021 12/09/21 0020 12/10/21 0350 12/10/21 0924 12/11/21 0530 12/12/21 0310  NA 143 139 139 143 138 144  K 3.2* 3.6 3.6 3.3* 3.3* 3.8  CL 111 110 109 112* 108 111  CO2 '22 23 23 '$ 21* 22 22  GLUCOSE 94 136* 129* 120* 94 96  BUN '21 20 20 22 18 17  '$ CREATININE 0.87 0.80 0.81 0.70 0.83 0.99  CALCIUM 9.2 8.7* 8.7* 8.7* 8.6* 8.9  MG 2.0  --   --   --  2.3  --   PHOS 3.5  --   --   --   --  4.6    GFR: Estimated Creatinine Clearance: 82.6 mL/min (by C-G formula based on SCr of 0.99 mg/dL). Liver Function Tests: Recent Labs  Lab 12/05/21 1527 12/06/21 2306 12/08/21 1021 12/12/21 0310  AST 25  --  23  --   ALT 17  --  15  --   ALKPHOS 48  --  46  --   BILITOT 0.8  --  0.7  --   PROT 6.7  --  6.6  --   ALBUMIN 3.9 NOT PERFORMED 3.7 3.6    No results for input(s): "LIPASE", "AMYLASE" in the last 168 hours. Recent Labs  Lab 12/06/21 2307  AMMONIA 11     HbA1C: No results for input(s): "HGBA1C" in the last 72 hours.  CBG: Recent Labs  Lab 12/10/21 2312 12/11/21 0627 12/11/21 1111 12/11/21 1600 12/11/21 2058  GLUCAP 93 87 82 85 83  Recent Results (from the past 240 hour(s))  CSF culture w Gram Stain     Status: None   Collection Time: 12/06/21  9:35 PM   Specimen: CSF; Cerebrospinal Fluid  Result Value Ref Range Status   Specimen Description CSF  Final   Special Requests TUBE 2 POSSIBLE CJD  Final   Gram Stain   Final    CYTOSPIN SMEAR WBC PRESENT, PREDOMINANTLY MONONUCLEAR NO ORGANISMS SEEN    Culture   Final    NO GROWTH 3 DAYS Performed at [facility], [address], [place], [place] [address]    Report Status 12/10/2021 FINAL  Final  Fungus Culture With Stain     Status: None (Preliminary result)   Collection Time: 12/06/21  9:35 PM   Specimen: Lumbar Puncture; Cerebrospinal Fluid  Result Value Ref Range Status    Fungus Stain Final report  Final    Comment: (NOTE) Performed At: [facility] [address], [place] [id number] Rush Farmer MD ER:[id number]    Fungus (Mycology) Culture PENDING  Incomplete   Fungal Source CSF  Final    Comment: Performed at [facility], [place] [address] [place], [place] [address]  Culture, fungus without smear     Status: None (Preliminary result)   Collection Time: 12/06/21  9:35 PM   Specimen: CSF; Cerebrospinal Fluid  Result Value Ref Range Status   Specimen Description CSF  Final   Special Requests POSSIBLE CJD  Final   Culture   Final    NO FUNGUS ISOLATED AFTER 5 DAYS Performed at [facility], [place] [address] [place], [place] [address]    Report Status PENDING  Incomplete  Fungus Culture Result     Status: None   Collection Time: 12/06/21  9:35 PM  Result Value Ref Range Status   Result 1 Comment  Final    Comment: (NOTE) KOH/Calcofluor preparation:  no fungus observed. Performed At: [facility] [place], [place] [id number] Rush Farmer MD AS:[id number]   MRSA Next Gen by PCR, Nasal     Status: None   Collection Time: 12/06/21 11:25 PM   Specimen: Nasal Mucosa; Nasal Swab  Result Value Ref Range Status   MRSA by PCR Next Gen NOT DETECTED NOT DETECTED Final    Comment: (NOTE) The GeneXpert MRSA Assay (FDA approved for NASAL specimens only), is one component of a comprehensive MRSA colonization surveillance program. It is not intended to diagnose MRSA infection nor to guide or monitor treatment for MRSA infections. Test performance is not FDA approved in patients less than 8 years old. Performed at [facility], [place] [address], [place], [place] [address]     Radiology Studies: MR BRAIN W WO CONTRAST  Result Date: 12/10/2021 CLINICAL DATA:  Delirium EXAM: MRI HEAD WITHOUT AND WITH CONTRAST TECHNIQUE: Multiplanar, multiecho pulse sequences of the brain and surrounding structures  were obtained without and with intravenous contrast. CONTRAST:  69m GADAVIST GADOBUTROL 1 MMOL/ML IV SOLN COMPARISON:  12/05/2021 FINDINGS: Brain: Unchanged multifocal abnormal cortical diffusion restriction in the right hemisphere, most evident right parietal lobe. No acute or chronic hemorrhage. Minimal multifocal hyperintense T2-weight signal within the white matter. Mild diffusion restriction also noted within the corpora striata, unchanged. Vascular: Major flow voids are preserved. Skull and upper cervical spine: Normal calvarium and skull base. Visualized upper cervical spine and soft tissues are normal. Sinuses/Orbits:No paranasal sinus fluid levels or advanced mucosal thickening. No mastoid or middle ear effusion.  Normal orbits. IMPRESSION: 1. Unchanged diffusion abnormalities of the right hemispheric cortex and the corpora striata. Infectious or inflammatory encephalitides remains of primary concern Electronically Signed   By: Ulyses Jarred M.D.   On: 12/10/2021 23:18    Scheduled Meds:  calcium carbonate  2 tablet Oral Q3H   Chlorhexidine Gluconate Cloth  6 each Topical Daily   heparin sodium (porcine)  1,000 Units Intracatheter Once   pantoprazole (PROTONIX) IV  40 mg Intravenous Q24H   sodium chloride flush  3 mL Intravenous Q12H   Continuous Infusions:  sodium chloride     0.9 % NaCl with KCl 40 mEq / L 75 mL/hr at 12/11/21 2252   albumin human 25 % 50 g in sodium chloride 0.9 %     calcium gluconate     citrate dextrose     heparin 1,200 Units/hr (12/12/21 0438)   valproate sodium 500 mg (12/11/21 2334)    LOS: 6 days   Time spent: 37mns  Nekoda Chock DO   To contact the attending provider between 7A-7P or the covering provider during after hours 7P-7A, please log into the web site [email or web address] and access using universal  [place] password for that web site. If you do not have the password, please call the hospital operator.

## 2021-12-12 NOTE — Procedures (Signed)
Patient Name: Spencer Ross  MRN: [id number]  Epilepsy Attending: Lora Havens  Referring Physician/Provider: Amie Portland, MD  Date: 12/11/2021 Duration: 22.37 mins  Patient history: 71yo M with ams. EEG to evaluate for seizure  Level of alertness: Awake  AEDs during EEG study: VPA  Technical aspects: This EEG study was done with scalp electrodes positioned according to the 10-20 International system of electrode placement. Electrical activity was reviewed with band pass filter of 1-'70Hz'$ , sensitivity of 7 uV/mm, display speed of 24m/sec with a '60Hz'$  notched filter applied as appropriate. EEG data were recorded continuously and digitally stored.  Video monitoring was available and reviewed as appropriate.  Description: .EEG also showed continuous generalized and lateralized right hemisphere 3 to 6 Hz theta-delta slowing. Lateralized periodic discharges were noted in right hemisphere at '1hz'$ .  Hyperventilation and photic stimulation were not performed.     ABNORMALITY - Lateralized periodic discharges ( LPD), right hemisphere  - Continuous slow, generalized and lateralized right hemisphere  IMPRESSION: This study showed evidence of epileptogenicity and cortical dysfunction arising from right hemisphere likely due to underlying structural abnormality and increased risk of seizure recurrence. Additionally there is moderate diffuse encephalopathy, nonspecific etiology. No seizures were seen throughout the recording.  EEG appears to be worsening compared to previous study on 12/07/2021  Fransisca Shawn OBarbra Sarks

## 2021-12-12 NOTE — Progress Notes (Addendum)
Neurology Progress Note   Subjective: Seen and examined Status post plasma exchange #2 today. According to family, he has had a waxing mental status in terms of his mentation.  At times he is having slow but full conversations with family and suddenly becomes very emotional and starts crying.  Other times just has a blank stare. EEG appears somewhat worse with LPD's on the right. Family also reached out to a friend who works with an autoimmune encephalitis organization and has requested to touch base with a neurologist at [facility]-Dr. Janie Morning will coordinate.  Will probably happen next week.  Exam: Vitals:   12/12/21 1123 12/12/21 1635  BP: 129/76 (!) 132/93  Pulse: 61 68  Resp: 20 17  Temp: 98.2 F (36.8 C) 99.4 F (37.4 C)  SpO2: 96% 98%   GEN: Lying in bed in restraints, appears uncomfortable and akathisic HEENT: Normocephalic atraumatic Lungs: Clear Cardiovascular: Regular rhythm Abdomen nondistended nontender Neurological exam He is awake, tracks me, takes long time to respond to questions but was able to tell me his name, name his son, when I directed my light to his eyes, he very angrily said "do not shine light in my eyes". Follow simple commands Cranial nerves: Pupils equal round reactive light, extraocular intact, blinks to threat from both sides, face with mild left lower facial droop which is inconsistent at times. Motor examination with antigravity in all fours Sensation intact light touch Coordination difficult to assess Of note I did not check for startle today because he had multiple family members in the room.  Pertinent Labs: CBC    Component Value Date/Time   WBC 9.0 12/12/2021 0310   RBC 4.54 12/12/2021 0310   HGB 13.8 12/12/2021 0310   HGB 14.0 07/14/2013 1037   HCT 40.5 12/12/2021 0310   HCT 42.2 07/14/2013 1037   PLT 167 12/12/2021 0310   PLT 171 07/14/2013 1037   MCV 89.2 12/12/2021 0310   MCV 89.0 07/14/2013 1037   MCH 30.4  12/12/2021 0310   MCHC 34.1 12/12/2021 0310   RDW 13.0 12/12/2021 0310   RDW 13.8 07/14/2013 1037   LYMPHSABS 1.3 07/14/2013 1037   MONOABS 0.3 07/14/2013 1037   EOSABS 0.2 07/14/2013 1037   BASOSABS 0.0 07/14/2013 1037       Latest Ref Rng & Units 12/12/2021    8:30 AM 12/12/2021    3:10 AM 12/11/2021    5:30 AM  BMP  Glucose 70 - 99 mg/dL 106  96  94   BUN 8 - 23 mg/dL '19  17  18   '$ Creatinine 0.61 - 1.24 mg/dL 1.20  0.99  0.83   Sodium 135 - 145 mmol/L 145  144  138   Potassium 3.5 - 5.1 mmol/L 4.0  3.8  3.3   Chloride 98 - 111 mmol/L 112  111  108   CO2 22 - 32 mmol/L '24  22  22   '$ Calcium 8.9 - 10.3 mg/dL 8.9  8.9  8.6      Workup summary: Negative CTA chest/abdomen/pelvis with contrast for malignancy screening  LTM negative for seizures  CSF studies:    WBC 0/0, RBC 0/0, protein and glucose pending due to concern for CJD M/E panel negative (cryptococcus, CMV, enterovirus, initiation coli K1, H. influenzae, HSV-1, HSV-2, HHV-6, human per echovirus, Listeria, Neisseria, Streptococcus agalactiae and pneumoniae, VZV)  Gram stain negative Bacterial culture no growth in 3 days, fungal culture pending  Pending: Beta Isoform Wynelle Cleveland disease, 14 3 3,  RT quic   Oligoclonal bands, IgG index   Autoimmune encephalitis panel on CSF   Lyme antigen CSF  Serum studies:   TSH normal, ammonia normal, B12 426,  Pending: MMA in process, B1 in process   HIV negative, RPR negative  Antinuclear antibody negative Sjogren's negative Thyroid antibodies -TPO negative but thyroglobulin antibodies 4.0-normal up to 0.9.   Neurodiagnostics: EEG: 12/11/2021-evidence of epileptogenicity and cortical dysfunction from the right hemisphere likely due to underlying structural abnormality and increased risk of seizure recurrence.  Additionally moderate diffuse encephalopathy nonspecific etiology.  EEG appears to be worsening compared to previous study on 12/07/2021.  Impression:   71 year old man past history of recent COVID infection 3 weeks ago followed by confusion, also has past history of hyperlipidemia, PE, antiphospholipid antibody syndrome on Xarelto who presented with changes in his personality and bizarre behavior since he got COVID.  MRI brain showed cortical ribbon restricted diffusion-primarily in the right cerebral hemisphere as well as in bilateral caudate heads and anterior putamen.  EEG with right hemispheric slowing.  CT chest abdomen pelvis no occult malignancy While in the hospital, neurology team observed movements concerning for facial brachial dystonic seizures raising concern for LGI 1 encephalitis, along with other differentials of CJD based on imaging findings. He was started on steroids, initially showed some improvement but then again had agitation and continues to have altered mental status.  Literature review reveals some support for using a second modality such as plasma exchange or IVIG in addition to steroids-given his antiphospholipid antibody syndrome, would avoid IVIG for the risk of hypercoagulability. Discussed with the family about starting plasma exchange which they agreed to. 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 Positive thyroglobulin antibodies Repeat EEg with LPD's  Recommendations: - Pulse dose steroids, completed  11/11 - 11/15 - Monitor BG while on steroids  - Continue PPI while on steroids  -Plasma exchange-first round on 12/10/2021.  Subsequent rounds on 12/12/2021, 12/15/2021 12/17/2021 on 12/19/2021-for a total of 5 rounds of plasma exchange, 1 every other day. -With thyroglobulin antibodies positive, unsure if there is a component of that for the encephalitis-has already been treated with  steroids and is getting plasma exchange. -Repeat EEG with LPD's.  On Depakote for mood stabilization.  Will make a therapeutic seizure dose.  We will consider repeating EEG tomorrow. -Load of Depakote 1 g and continue standing doses of 500 mg twice daily starting from tomorrow morning. Neurology will continue to follow with you   Plan discussed with Dr. Avon Gully, and patient's daughter, son and wife at bedside. -- Amie Portland, MD Neurologist [facility] Pager: [phone]

## 2021-12-13 ENCOUNTER — Inpatient Hospital Stay (HOSPITAL_COMMUNITY): Payer: Medicare PPO

## 2021-12-13 DIAGNOSIS — G9340 Encephalopathy, unspecified: Secondary | ICD-10-CM | POA: Diagnosis not present

## 2021-12-13 LAB — BASIC METABOLIC PANEL
Anion gap: 10 (ref 5–15)
BUN: 17 mg/dL (ref 8–23)
CO2: 21 mmol/L — ABNORMAL LOW (ref 22–32)
Calcium: 8.8 mg/dL — ABNORMAL LOW (ref 8.9–10.3)
Chloride: 114 mmol/L — ABNORMAL HIGH (ref 98–111)
Creatinine, Ser: 1.3 mg/dL — ABNORMAL HIGH (ref 0.61–1.24)
GFR, Estimated: 60 mL/min — ABNORMAL LOW (ref >60)
Glucose, Bld: 101 mg/dL — ABNORMAL HIGH (ref 70–99)
Potassium: 3.6 mmol/L (ref 3.5–5.1)
Sodium: 145 mmol/L (ref 135–145)

## 2021-12-13 LAB — GLUCOSE, CAPILLARY
Glucose-Capillary: 98 mg/dL (ref 70–99)
Glucose-Capillary: 106 mg/dL — ABNORMAL HIGH (ref 70–99)
Glucose-Capillary: 81 mg/dL (ref 70–99)
Glucose-Capillary: 116 mg/dL — ABNORMAL HIGH (ref 70–99)

## 2021-12-13 LAB — CBC
HCT: 38.4 % — ABNORMAL LOW (ref 39.0–52.0)
Hemoglobin: 12.9 g/dL — ABNORMAL LOW (ref 13.0–17.0)
MCH: 30.2 pg (ref 26.0–34.0)
MCHC: 33.6 g/dL (ref 30.0–36.0)
MCV: 89.9 fL (ref 80.0–100.0)
Platelets: 141 10*3/uL — ABNORMAL LOW (ref 150–400)
RBC: 4.27 MIL/uL (ref 4.22–5.81)
RDW: 13.2 % (ref 11.5–15.5)
WBC: 9.8 10*3/uL (ref 4.0–10.5)
nRBC: 0 % (ref 0.0–0.2)

## 2021-12-13 LAB — VALPROIC ACID LEVEL: Valproic Acid Lvl: 72 ug/mL (ref 50.0–100.0)

## 2021-12-13 LAB — HEPARIN LEVEL (UNFRACTIONATED)
Heparin Unfractionated: 0.29 IU/mL — ABNORMAL LOW (ref 0.30–0.70)
Heparin Unfractionated: 0.51 IU/mL (ref 0.30–0.70)

## 2021-12-13 NOTE — Progress Notes (Signed)
ANTICOAGULATION CONSULT NOTE  Pharmacy Consult for Heparin Indication:  APLS with h/o PE  Allergies  Allergen Reactions   Diphenhydramine     Other reaction(s): prostate swelling, increase urination   Penicillins Other (See Comments)    "childhood reaction from mother"   Trazodone Hcl     Other reaction(s): weak stream    Patient Measurements: Height: '6\' 5"'$  (195.6 cm) Weight: 81.8 kg (180 lb 5.4 oz) IBW/kg (Calculated) : 89.1  Heparin Dosing Weight: 89 kg  Vital Signs: Temp: 98 F (36.7 C) (11/18 1601) Temp Source: Oral (11/18 1504) BP: 150/83 (11/18 1601) Pulse Rate: 67 (11/18 1601)  Labs: Recent Labs    12/11/21 0500 12/11/21 0530 12/12/21 0310 12/12/21 0830 12/13/21 0355 12/13/21 1600  HGB 13.8  --  13.8  --  12.9*  --   HCT 40.2  --  40.5  --  38.4*  --   PLT 174  --  167  --  141*  --   APTT 100*  --   --   --   --   --   HEPARINUNFRC 0.39  --  0.41  --  0.29* 0.51  CREATININE  --    < > 0.99 1.20 1.30*  --    < > = values in this interval not displayed.     Estimated Creatinine Clearance: 62.9 mL/min (A) (by C-G formula based on SCr of 1.3 mg/dL (H)).   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 Unable to take PO consistently, last dose reported on 11/09. Pharmacy consulted to manage heparin while Xarelto is on hold.  Heparin level 0.5 at goal  on heparin drip rate 1200 units/hr. CBC stable, no bleeding noted   Goal of Therapy:  Heparin level 0.3-0.7 units/ml Monitor platelets by anticoagulation protocol: Yes   Plan:  Conitnue heparin infusion at 1200 units/hr Check heparin level daily while on heparin Continue to monitor H&H and platelets   Bonnita Nasuti Pharm.D. CPP, BCPS Clinical Pharmacist [phone] 12/13/2021 6:52 PM

## 2021-12-13 NOTE — Progress Notes (Signed)
Neurology Progress Note   Subjective: Seen and examined Started on LTM yesterday for concern for seizure-like episode. Overnight EEG unremarkable for any evidence of seizures.  Continues to show right-sided cerebral dysfunction which is to be expected based on what we know from his imaging as well. Was started on Depakote therapeutic dose for seizures yesterday after Depakote was initially started for mood on low-dose.   Exam: Vitals:   12/13/21 0653 12/13/21 0724  BP: (!) 178/84 (!) 152/73  Pulse: (!) 56 (!) 55  Resp: 18 18  Temp: 99 F (37.2 C) 98.8 F (37.1 C)  SpO2: 98% 98%   GEN: Lying in bed in restraints, appears uncomfortable and akathisic HEENT: Normocephalic atraumatic Lungs: Clear Cardiovascular: Regular rhythm Abdomen nondistended nontender Neurological exam Not much interactive today. Did not follow commands. Wife said spoke with him.  No major change per wife.  Did not perform complete neurological exam.  Pertinent Labs: CBC    Component Value Date/Time   WBC 9.8 12/13/2021 0355   RBC 4.27 12/13/2021 0355   HGB 12.9 (L) 12/13/2021 0355   HGB 14.0 07/14/2013 1037   HCT 38.4 (L) 12/13/2021 0355   HCT 42.2 07/14/2013 1037   PLT 141 (L) 12/13/2021 0355   PLT 171 07/14/2013 1037   MCV 89.9 12/13/2021 0355   MCV 89.0 07/14/2013 1037   MCH 30.2 12/13/2021 0355   MCHC 33.6 12/13/2021 0355   RDW 13.2 12/13/2021 0355   RDW 13.8 07/14/2013 1037   LYMPHSABS 1.3 07/14/2013 1037   MONOABS 0.3 07/14/2013 1037   EOSABS 0.2 07/14/2013 1037   BASOSABS 0.0 07/14/2013 1037       Latest Ref Rng & Units 12/13/2021    3:55 AM 12/12/2021    8:30 AM 12/12/2021    3:10 AM  BMP  Glucose 70 - 99 mg/dL 101  106  96   BUN 8 - 23 mg/dL '17  19  17   '$ Creatinine 0.61 - 1.24 mg/dL 1.30  1.20  0.99   Sodium 135 - 145 mmol/L 145  145  144   Potassium 3.5 - 5.1 mmol/L 3.6  4.0  3.8   Chloride 98 - 111 mmol/L 114  112  111   CO2 22 - 32 mmol/L '21  24  22   '$ Calcium 8.9 -  10.3 mg/dL 8.8  8.9  8.9      Workup summary: Negative CTA chest/abdomen/pelvis with contrast for malignancy screening  LTM negative for seizures  CSF studies:    WBC 0/0, RBC 0/0, protein and glucose pending due to concern for CJD M/E panel negative (cryptococcus, CMV, enterovirus, initiation coli K1, H. influenzae, HSV-1, HSV-2, HHV-6, human per echovirus, Listeria, Neisseria, Streptococcus agalactiae and pneumoniae, VZV)  Gram stain negative Bacterial culture no growth in 3 days, fungal culture pending  Pending: Beta Isoform Creutzfeldt Jacob disease, 14 3 3, RT quic   Oligoclonal bands, IgG index   Autoimmune encephalitis panel on CSF   Lyme antigen CSF  Serum studies:   TSH normal, ammonia normal, B12 426,  Pending: MMA in process, B1 in process   HIV negative, RPR negative  Antinuclear antibody negative Sjogren's negative Thyroid antibodies -TPO negative but thyroglobulin antibodies 4.0-normal up to 0.9.   Neurodiagnostics: EEG: 12/11/2021-evidence of epileptogenicity and cortical dysfunction from the right hemisphere likely due to underlying structural abnormality and increased risk of seizure recurrence.  Additionally moderate diffuse encephalopathy nonspecific etiology.  EEG appears to be worsening compared to previous study on 12/07/2021.  Impression:  71 year old man past history of recent COVID infection 3 weeks ago followed by confusion, also has past history of hyperlipidemia, PE, antiphospholipid antibody syndrome on Xarelto who presented with changes in his personality and bizarre behavior since he got COVID.  MRI brain showed cortical ribbon restricted diffusion-primarily in the right cerebral hemisphere as well as in bilateral caudate heads and anterior putamen.  EEG with right hemispheric slowing.  CT chest abdomen pelvis no occult malignancy While in the hospital, neurology team observed movements concerning for facial brachial dystonic seizures raising concern for  LGI 1 encephalitis, along with other differentials of CJD based on imaging findings. He was started on steroids, initially showed some improvement but then again had agitation and continues to have altered mental status.  Literature review reveals some support for using a second modality such as plasma exchange or IVIG in addition to steroids-given his antiphospholipid antibody syndrome, would avoid IVIG for the risk of hypercoagulability. Discussed with the family about starting plasma exchange which they agreed to. Work-up unremarkable thus far but thyroglobulin antibodies are positive Concern for seizure last night - started on LTM that showed slowing only.  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 Positive thyroglobulin antibodies Repeat EEg with LPD's  Recommendations: - Pulse dose steroids, completed  11/11 - 11/15 -Plasma exchange-first round on 12/10/2021.  Subsequent rounds on 12/12/2021, 12/15/2021 12/17/2021 on 12/19/2021-for a total of 5 rounds of plasma exchange, 1 every other day. -With thyroglobulin antibodies positive, unsure if there is a component of that for the encephalitis-has already been treated with steroids and is getting plasma exchange. -Repeat EEG with LPD's.  On Depakote for mood stabilization. Was loaded to make level therapeutic. -Continue Depakote 500 BID for now. -family wants to speak to [facility] Monday - we will coordinate with family. -DC LTM Neurology will continue to follow with you   Plan discussed with Dr. Avon Gully, and patient's wife. -- Amie Portland, MD Neurologist [facility] Pager: [phone]  No charge note

## 2021-12-13 NOTE — Progress Notes (Addendum)
Spell of abnormal behavior discussed with nursing tech who witnessed it.  She reported he was initially glazed, which is not abnormal for him, but then subsequently his eyes rolled back into his head and he extended his neck for less than 30 seconds.  He did not seem responsive to her hitting the bed trying to get his attention.  By the time she was able to touch him directly he was reactive to touch.  On my exam he is minimally interactive, nonverbal, opens his mouth as if to speak but does not speak, but does follow command to stick out his tongue after some delay.  Does not follow commands to close his eyes.  Tracks me bilaterally.  Does seem to startle easily.  No observed faciobrachial dystonic seizures at this time.  Given that this occurred after Depakote load earlier today and EEG was worsening on 12/12/2021, will initiate long-term EEG monitoring  No charge note, billing per Dr. Rory Percy later today

## 2021-12-13 NOTE — Progress Notes (Signed)
ANTICOAGULATION CONSULT NOTE  Pharmacy Consult for Heparin Indication:  APLS with h/o PE  Allergies  Allergen Reactions   Diphenhydramine     Other reaction(s): prostate swelling, increase urination   Penicillins Other (See Comments)    "childhood reaction from mother"   Trazodone Hcl     Other reaction(s): weak stream    Patient Measurements: Height: '6\' 5"'$  (195.6 cm) Weight: 81.8 kg (180 lb 5.4 oz) IBW/kg (Calculated) : 89.1  Heparin Dosing Weight: 89 kg  Vital Signs: Temp: 98.8 F (37.1 C) (11/18 0724) Temp Source: Oral (11/18 0724) BP: 152/73 (11/18 0724) Pulse Rate: 55 (11/18 0724)  Labs: Recent Labs    12/11/21 0500 12/11/21 0530 12/12/21 0310 12/12/21 0830 12/13/21 0355  HGB 13.8  --  13.8  --  12.9*  HCT 40.2  --  40.5  --  38.4*  PLT 174  --  167  --  141*  APTT 100*  --   --   --   --   HEPARINUNFRC 0.39  --  0.41  --  0.29*  CREATININE  --    < > 0.99 1.20 1.30*   < > = values in this interval not displayed.    Estimated Creatinine Clearance: 62.9 mL/min (A) (by C-G formula based on SCr of 1.3 mg/dL (H)).   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 Unable to take PO consistently, last dose reported on 11/09. Pharmacy consulted to manage heparin while Xarelto is on hold.  Heparin level slightly below goal at 0.29 on 1200 units/hr. CBC stable. Per discussion with RN, PIV occluded overnight prompting a line exchange. Unknown duration for how long the heparin infusion was interrupted. Due to patient remaining stable at current infusion rate, will hold on adjustments and recheck level in 8 hours.   Goal of Therapy:  Heparin level 0.3-0.7 units/ml Monitor platelets by anticoagulation protocol: Yes   Plan:  Conitnue heparin infusion at 1200 units/hr Check heparin level in 8 hours and daily while on heparin Continue to monitor H&H and  platelets    Thank you for allowing pharmacy to be a part of this patient's care.  Ardyth Harps, PharmD Clinical Pharmacist

## 2021-12-13 NOTE — Progress Notes (Signed)
PROGRESS NOTE    Spencer Ross  XQJ:[id number] DOB: 07-17-1950 DOA: 12/05/2021 PCP: Lujean Amel, MD    Brief Narrative:  71 year old male with a history of antiphospholipid antibody, pulmonary embolism in the past on chronic anticoagulation, recently had COVID-19 infection approximately 3 weeks PTA presented with progressive confusion over the past few weeks. MRI showed cortical restricted diffusion primarily in the right cerebral hemisphere, bilateral caudate heads and anterior putamina.  Neurology following.  He underwent lumbar puncture with several CSF studies in process. Infectious Meningitis/encephalitis panel noted to be negative.  He was started on high-dose IV steroids.  Overall mental status appears to be waxing and waning.  Nontunneled right IJ CVC placed by CCM 11/14.  Started alternate day plasma pheresis 11/15 through 11/24.  Ongoing waxing and waning mental status.  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  Repeat MRI brain with and without contrast on 11/15 without changes compared to prior. -Neurology following and their input much appreciated. -Continuous EEG did not show any epileptiform discharges initially (12th) - noted to be abnormal during the day 16th with questionable seizure overnight - long term EEG ongoing -Depakote dose increased 17th (previously on low dose for mood) -Lumbar puncture performed on 11/11.  Several studies are still currently in process -Completed 5-day course of high-dose Solu-Medrol 1 g daily on 11/15.  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  As per Dr. Rory Percy, many of these results are being directly coordinated with neurology team. -Repeat long term EEG pending 17th given overnight 'seizure-like' activity  Acute metabolic encephalopathy Dysphagia Moderate caloric malnutrition -Secondary to above -Medications transitioned to IV given inconsistent PO intake -Continue to follow with bedside swallow/speech as indicated -Dietary to follow given prolonged NPO status - discussed possible need for NG with family, they are agreeable if necessary  Antiphospholipid antibody syndrome History of PE -On chronic anticoagulation -Xarelto was on hold for lumbar puncture -Currently on heparin infusion. Restart Xarelto in 24-48h once oral route established and no further procedures are indicated   Anxiety -Continue on home dose of BuSpar  Asymptomatic sinus bradycardia: Telemetry shows SB in the 40s-50s. TSH normal.   No rate control medications. Clinically stable  Hypokalemia, resolved: Follow repeat labs  Recent COVID-19 infection -First diagnosed approximately 3 weeks ago -He has been vaccinated -He was taking steroids as well as ivermectin prior to admission -Currently not hypoxic and hemodynamically stable.  DVT prophylaxis: Heparin infusion Code Status: Full code Family Communication: Spouse and son updated at bedside  Disposition Plan: Status is:  Inpatient Remains inpatient appropriate because: Continued work-up and treatment including plasma exchange, of encephalopathy  Consultants:  Neurology PCCM for CVC placement  Procedures:  Lumbar puncture 11/11 RIJ CVC placement  11/14  Antimicrobials:  All discontinued   Subjective: Questionable seizure noted by staff overnight, patient remains confused, poorly interactive following commands intermittently.  Nonverbal for myself and family at bedside.  Objective: Vitals:   12/12/21 2240 12/12/21 2243 12/13/21 0653 12/13/21 0724  BP: (!) 170/115 (!) 150/97 (!) 178/84 (!) 152/73  Pulse: 71 68 (!) 56 (!) 55  Resp:  '20 18 18  '$ Temp:  98 F (36.7 C) 99 F (37.2 C) 98.8 F (37.1 C)  TempSrc:   Oral Oral  SpO2: 95% 95% 98% 98%  Weight:      Height:        Intake/Output Summary (Last 24 hours) at 12/13/2021 0738 Last data filed at 12/13/2021 4332 Gross per 24 hour  Intake 3697.37 ml  Output 2850 ml  Net 847.37 ml    Filed Weights   12/06/21 2217 12/08/21 0214 12/10/21 0901  Weight: 81.3 kg 81.8 kg 81.8 kg    Examination:  General exam: Young male, moderately built and nourished lying with restraints as described above, in bed, startles easily when nursing attempts to even check vital signs or MD tries to examine. Respiratory system: Clear to auscultation.  No increased work of breathing. Cardiovascular system: S1 and S2 heard, regular bradycardia.  No murmurs, JVD or pedal edema.  Telemetry personally reviewed: Overnight SB mostly in the 50s, SR in the 70s this morning.  No pauses or heart blocks noted. Gastrointestinal system: Abdomen is nondistended, soft and nontender. No organomegaly or masses felt. Normal bowel sounds heard. Central nervous system: Alert and oriented to self and spouse at bedside.  No cranial nerve deficits appreciated. Extremities: Symmetric 5 x 5 power. Skin: No rashes, lesions or ulcers Psychiatry: Does not appear anxious or agitated.  Rest  cannot be assessed due to AMS at this time.    Data Reviewed: I have personally reviewed following labs and imaging studies  CBC: Recent Labs  Lab 12/09/21 0020 12/10/21 0350 12/11/21 0500 12/12/21 0310 12/13/21 0355  WBC 9.8 10.2 9.6 9.0 9.8  HGB 12.5* 13.0 13.8 13.8 12.9*  HCT 36.8* 39.2 40.2 40.5 38.4*  MCV 89.3 90.7 89.1 89.2 89.9  PLT 207 192 174 167 141*    Basic Metabolic Panel: Recent Labs  Lab 12/08/21 1021 12/09/21 0020 12/10/21 0924 12/11/21 0530 12/12/21 0310 12/12/21 0830 12/13/21 0355  NA 143   < > 143 138 144 145 145  K 3.2*   < > 3.3* 3.3* 3.8 4.0 3.6  CL 111   < > 112* 108 111 112* 114*  CO2 22   < > 21* '22 22 24 '$ 21*  GLUCOSE 94   < > 120* 94 96 106* 101*  BUN 21   < > '22 18 17 19 17  '$ CREATININE 0.87   < > 0.70 0.83 0.99 1.20 1.30*  CALCIUM 9.2   < > 8.7* 8.6* 8.9 8.9 8.8*  MG 2.0  --   --  2.3  --   --   --   PHOS 3.5  --   --   --  4.6  --   --    < > = values in this interval not displayed.    GFR: Estimated Creatinine Clearance: 62.9 mL/min (A) (by C-G formula based on SCr of 1.3 mg/dL (H)). Liver Function Tests: Recent Labs  Lab 12/06/21 2306 12/08/21 1021 12/12/21 0310  AST  --  23  --   ALT  --  15  --   ALKPHOS  --  46  --   BILITOT  --  0.7  --   PROT  --  6.6  --   ALBUMIN 4.4 3.7 3.6    No results for input(s): "LIPASE", "AMYLASE" in the last 168 hours. Recent Labs  Lab 12/06/21 2307 2022/01/01 1636  AMMONIA 11 24     HbA1C: No results for input(s): "HGBA1C" in the last 72 hours.  CBG: Recent Labs  Lab 01-01-2022 0728 Jan 01, 2022 1121 01/01/22 1636 01/01/2022 2345 12/13/21 0733  GLUCAP 89 93 82 86 98      Recent Results (from the past 240 hour(s))  CSF culture w Gram Stain     Status: None   Collection Time: 12/06/21  9:35 PM   Specimen: CSF; Cerebrospinal Fluid  Result Value Ref Range Status   Specimen Description CSF  Final   Special Requests TUBE 2 POSSIBLE CJD  Final   Gram Stain   Final    CYTOSPIN  SMEAR WBC PRESENT, PREDOMINANTLY MONONUCLEAR NO ORGANISMS SEEN    Culture   Final    NO GROWTH 3 DAYS Performed at [facility], [address] [place], [place] [address]    Report Status 12/10/2021 FINAL  Final  Fungus Culture With Stain     Status: None (Preliminary result)   Collection Time: 12/06/21  9:35 PM   Specimen: Lumbar Puncture; Cerebrospinal Fluid  Result Value Ref Range Status   Fungus Stain Final report  Final    Comment: (NOTE) Performed At: [facility] [address] [place], [place] [id number] Rush Farmer MD [id number]    Fungus (Mycology) Culture PENDING  Incomplete   Fungal Source CSF  Final    Comment: Performed at [facility], [place] [address] [place], [place] [address]  Culture, fungus without smear     Status: None (Preliminary result)   Collection Time: 12/06/21  9:35 PM   Specimen: CSF; Cerebrospinal Fluid  Result Value Ref Range Status   Specimen Description CSF  Final   Special Requests POSSIBLE CJD  Final   Culture   Final    NO FUNGUS ISOLATED AFTER 6 DAYS Performed at [facility], [place] [address] [place], [place] [address]    Report Status PENDING  Incomplete  Fungus Culture Result     Status: None   Collection Time: 12/06/21  9:35 PM  Result Value Ref Range Status   Result 1 Comment  Final    Comment: (NOTE) KOH/Calcofluor preparation:  no fungus observed. Performed At: [facility] [place], [place] [id number] Rush Farmer MD [id number]   MRSA Next Gen by PCR, Nasal     Status: None   Collection Time: 12/06/21 11:25 PM   Specimen: Nasal Mucosa; Nasal Swab  Result Value Ref Range Status   MRSA by PCR Next Gen NOT DETECTED NOT DETECTED Final    Comment: (NOTE) The GeneXpert MRSA Assay (FDA approved for NASAL specimens only), is one component of a comprehensive MRSA colonization surveillance program. It is not intended to diagnose MRSA infection nor to guide or monitor  treatment for MRSA infections. Test performance is not FDA approved in patients less than 55 years old. Performed at [facility], [place] [address] [place], [place] [address]     Radiology Studies: EEG adult  Result Date: January 01, 2022 Lora Havens, MD     Jan 01, 2022 11:27 AM Patient Name: Spencer Ross MRN: [id number] Epilepsy Attending: Lora Havens Referring Physician/Provider: Amie Portland, MD Date:  12/11/2021 Duration: 22.37 mins Patient history: 71yo M with ams. EEG to evaluate for seizure Level of alertness: Awake AEDs during EEG study: VPA Technical aspects: This EEG study was done with scalp electrodes positioned according to the 10-20 International system of electrode placement. Electrical activity was reviewed with band pass filter of 1-'70Hz'$ , sensitivity of 7 uV/mm, display speed of 74m/sec with a '60Hz'$  notched filter applied as appropriate. EEG data were recorded continuously and digitally stored.  Video monitoring was available and reviewed as appropriate. Description: .EEG also showed continuous generalized and lateralized right hemisphere 3 to 6 Hz theta-delta slowing. Lateralized periodic discharges were noted in right hemisphere at '1hz'$ .  Hyperventilation and photic stimulation were not performed.   ABNORMALITY - Lateralized periodic discharges ( LPD), right hemisphere - Continuous slow, generalized and lateralized right hemisphere IMPRESSION: This study showed evidence of epileptogenicity and cortical dysfunction arising from right hemisphere likely due to underlying structural abnormality and increased risk of seizure recurrence. Additionally there is moderate diffuse encephalopathy, nonspecific etiology. No seizures were seen throughout the recording. EEG appears to be worsening compared to previous study on 12/07/2021 Priyanka O Yadav    Scheduled Meds:  Chlorhexidine Gluconate Cloth  6 each Topical Daily   pantoprazole (PROTONIX) IV  40 mg Intravenous Q24H   sodium  chloride flush  3 mL Intravenous Q12H   Continuous Infusions:  sodium chloride     0.9 % NaCl with KCl 40 mEq / L 75 mL/hr at 12/12/21 2244   heparin 1,200 Units/hr (12/13/21 0406)   valproate sodium      LOS: 7 days   Time spent: 39ms  Davonne Baby DO   To contact the attending provider between 7A-7P or the covering provider during after hours 7P-7A, please log into the web site [email or web address] and access using universal [place] password for that web site. If you do not have the password, please call the hospital operator.

## 2021-12-13 NOTE — Procedures (Addendum)
Patient Name: Spencer Ross  MRN: [id number]  Epilepsy Attending: Lora Havens  Referring Physician/Provider: Lorenza Chick, MD  Duration: 12/13/2021 0237 to 12/13/2021 1014   Patient history: 71yo M with ams. EEG to evaluate for seizure   Level of alertness: Awake, asleep   AEDs during EEG study: VPA   Technical aspects: This EEG study was done with scalp electrodes positioned according to the 10-20 International system of electrode placement. Electrical activity was reviewed with band pass filter of 1-'70Hz'$ , sensitivity of 7 uV/mm, display speed of 54m/sec with a '60Hz'$  notched filter applied as appropriate. EEG data were recorded continuously and digitally stored.  Video monitoring was available and reviewed as appropriate.   Description: No clear posterior dominant rhythm was seen. Sleep was characterized by sleep spindles (12-'14hz'$ ), maximal fronto-central region. EEG also showed continuous generalized 3 to 7 Hz theta-delta slowing in left hemisphere and 2-'3Hz'$  delta slowing in right hemisphere. Sharp waves were noted in right hemisphere, maximal right temporal region qasi-periodic at 1 hz. Hyperventilation and photic stimulation were not performed.      ABNORMALITY - Sharp waves,  right hemisphere  - Continuous slow, generalized and lateralized right hemisphere   IMPRESSION: This study showed evidence of epileptogenicity and cortical dysfunction arising from right hemisphere. Additionally there is moderate diffuse encephalopathy, nonspecific etiology. No seizures were seen throughout the recording.   Spencer Ross

## 2021-12-13 NOTE — Progress Notes (Signed)
Called to rm by NT who is sitting with pt. She stated pt waved and state bye and then eyes rolled up again. This lasted approx 10 sec. Dr. Lyn Records texed about this episode.

## 2021-12-13 NOTE — Progress Notes (Signed)
LTM EEG disconnected - no skin breakdown at unhook.  

## 2021-12-13 NOTE — Progress Notes (Signed)
Dr. Lyn Records [facility] here to assess pt. New orders written for LT EEG.

## 2021-12-13 NOTE — Plan of Care (Signed)
Pt had ? Seizure like activity x2 this shift Neuro MD up to assess pt. LT EEG started tonight. Pt remains confused and for most of the time is nonverbal. UTA learning status. Pt will say No once in awhile when being touched and his name but that is it

## 2021-12-14 DIAGNOSIS — G9340 Encephalopathy, unspecified: Secondary | ICD-10-CM | POA: Diagnosis not present

## 2021-12-14 LAB — GLUCOSE, CAPILLARY
Glucose-Capillary: 106 mg/dL — ABNORMAL HIGH (ref 70–99)
Glucose-Capillary: 119 mg/dL — ABNORMAL HIGH (ref 70–99)

## 2021-12-14 LAB — CBC
HCT: 37.2 % — ABNORMAL LOW (ref 39.0–52.0)
Hemoglobin: 12.7 g/dL — ABNORMAL LOW (ref 13.0–17.0)
MCH: 30.7 pg (ref 26.0–34.0)
MCHC: 34.1 g/dL (ref 30.0–36.0)
MCV: 89.9 fL (ref 80.0–100.0)
Platelets: 148 10*3/uL — ABNORMAL LOW (ref 150–400)
RBC: 4.14 MIL/uL — ABNORMAL LOW (ref 4.22–5.81)
RDW: 13.3 % (ref 11.5–15.5)
WBC: 9.5 10*3/uL (ref 4.0–10.5)
nRBC: 0 % (ref 0.0–0.2)

## 2021-12-14 LAB — HEPARIN LEVEL (UNFRACTIONATED): Heparin Unfractionated: 0.51 IU/mL (ref 0.30–0.70)

## 2021-12-14 NOTE — Progress Notes (Signed)
Neurology Progress Note   Subjective: Seen and examined Sitting in bed today and being fed by the nurse technician. He was able to say a few words today.  He greeted me and said "you have a big small".  He tracked me all the time although still had minimal verbal output but appeared more awake alert today. I did witness a small twitching of his face during my encounter today but did not notice any excessive startling or dystonic movements of the arm.   Exam: Vitals:   12/14/21 0417 12/14/21 0800  BP: (!) 148/80 (!) 141/100  Pulse: 63 61  Resp: 18 14  Temp: 98.2 F (36.8 C) 99 F (37.2 C)  SpO2: 97% 95%   General: Comfortably sitting in bed being fed breakfast. HEENT: Normocephalic/atraumatic Lungs: Clear Cardiovascular: Regular rhythm Neurological exam He is awake alert He was able to tell me his name said my name is [name]. When I extended my hand to shake his hand he did not shake my hand but he said "you have a big smile". Cranial nerves: Pupils equal round reactive light, extraocular movement intact, visual fields full, face appears grossly symmetric. Motor examination with symmetric strength in all 4 extremities Sensation intact to touch and noxious stimulation  Pertinent Labs: CBC    Component Value Date/Time   WBC 9.5 12/14/2021 0415   RBC 4.14 (L) 12/14/2021 0415   HGB 12.7 (L) 12/14/2021 0415   HGB 14.0 07/14/2013 1037   HCT 37.2 (L) 12/14/2021 0415   HCT 42.2 07/14/2013 1037   PLT 148 (L) 12/14/2021 0415   PLT 171 07/14/2013 1037   MCV 89.9 12/14/2021 0415   MCV 89.0 07/14/2013 1037   MCH 30.7 12/14/2021 0415   MCHC 34.1 12/14/2021 0415   RDW 13.3 12/14/2021 0415   RDW 13.8 07/14/2013 1037   LYMPHSABS 1.3 07/14/2013 1037   MONOABS 0.3 07/14/2013 1037   EOSABS 0.2 07/14/2013 1037   BASOSABS 0.0 07/14/2013 1037       Latest Ref Rng & Units 12/13/2021    3:55 AM 12/12/2021    8:30 AM 12/12/2021    3:10 AM  BMP  Glucose 70 - 99 mg/dL 101  106  96    BUN 8 - 23 mg/dL '17  19  17   '$ Creatinine 0.61 - 1.24 mg/dL 1.30  1.20  0.99   Sodium 135 - 145 mmol/L 145  145  144   Potassium 3.5 - 5.1 mmol/L 3.6  4.0  3.8   Chloride 98 - 111 mmol/L 114  112  111   CO2 22 - 32 mmol/L '21  24  22   '$ Calcium 8.9 - 10.3 mg/dL 8.8  8.9  8.9      Workup summary: Negative CTA chest/abdomen/pelvis with contrast for malignancy screening  LTM negative for seizures  CSF studies:    WBC 0/0, RBC 0/0, protein and glucose pending due to concern for CJD M/E panel negative (cryptococcus, CMV, enterovirus, initiation coli K1, H. influenzae, HSV-1, HSV-2, HHV-6, human per echovirus, Listeria, Neisseria, Streptococcus agalactiae and pneumoniae, VZV)  Gram stain negative Bacterial culture no growth in 3 days, fungal culture pending  Pending: Beta Isoform Creutzfeldt Jacob disease, 14 3 3, RT quic   Oligoclonal bands, IgG index   Autoimmune encephalitis panel on CSF   Lyme antigen CSF  Serum studies:   TSH normal, ammonia normal, B12 426,  Pending: MMA in process, B1 in process   HIV negative, RPR negative  Antinuclear antibody negative  Sjogren's negative Thyroid antibodies -TPO negative but thyroglobulin antibodies 4.0-normal up to 0.9.   Neurodiagnostics: EEG: 12/11/2021-evidence of epileptogenicity and cortical dysfunction from the right hemisphere likely due to underlying structural abnormality and increased risk of seizure recurrence.  Additionally moderate diffuse encephalopathy nonspecific etiology.  EEG appears to be worsening compared to previous study on 12/07/2021.  Impression:  71 year old man past history of recent COVID infection 3 weeks prior to presentation followed by confusion, also has past history of hyperlipidemia, PE, antiphospholipid antibody syndrome on Xarelto who presented with changes in his personality and bizarre behavior since he got COVID.   MRI brain showed cortical ribbon restricted diffusion-primarily in the right cerebral  hemisphere as well as in bilateral caudate heads and anterior putamen.   EEG with right hemispheric slowing.  CT chest abdomen pelvis no occult malignancy While in the hospital, neurology team observed movements concerning for facial brachial dystonic seizures raising concern for LGI 1 encephalitis, along with other differentials of CJD based on imaging and clinical findings. He was started on steroids, initially showed some improvement but then again had agitation and continues to have altered mental status.  Completed 5 days of high-dose IV methylprednisolone.  Literature review revealed some support for using a second modality such as plasma exchange or IVIG in addition to steroids-given his antiphospholipid antibody syndrome, would avoid IVIG for the risk of hypercoagulability. Discussed with the family about starting plasma exchange which they agreed to. Work-up unremarkable thus far but thyroglobulin antibodies are positive Concern for seizure to nights ago - started on LTM that showed slowing only.  LTM was then discontinued He was put on low-dose Depakote for his agitation a few days into admission and this was increased to therapeutic dose after concern for seizure-like activity witnessed by the staff couple days ago. Today appears to be much more awake and communicative although still slow to respond to questions.  He has had a waxing and waning mental status where on some days he has completely not talk to me at all and other days he was able to tell me his name, current month and the fact that he is at [facility] along with trying to follow simple commands.  I suspect this is all part of the underlying encephalitis versus degenerative process.  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 Positive thyroglobulin  antibodies  Recommendations: - Pulse dose steroids, completed  11/11 - 11/15 -Plasma exchange-first round on 12/10/2021.  Subsequent rounds on 12/12/2021, 12/15/2021 12/17/2021 on 12/19/2021-for a total of 5 rounds of plasma exchange, 1 every other day. -With thyroglobulin antibodies positive, unsure if there is a component of that for the encephalitis-has already been treated with steroids and is getting plasma exchange. -Repeat EEG few days ago concerning for LPD's.  Was on Depakote for mood stabilization. Was loaded to make level therapeutic. -Continue Depakote 500 BID for now. -family wants to speak to [facility] Specialist Monday - we will coordinate with family-Dr. Pearletha Forge is the specialist at [facility].  Dr. Curly Shores, who is a [employer] alumnus would attempt contact with the specialist on Monday. Neurology will continue to follow with you Plan discussed with Dr. Avon Gully -- Amie Portland, MD Neurologist [facility] Pager: [phone]

## 2021-12-14 NOTE — Progress Notes (Signed)
Ashley for Heparin Indication:  APLS with h/o PE  Allergies  Allergen Reactions   Diphenhydramine     Other reaction(s): prostate swelling, increase urination   Penicillins Other (See Comments)    "childhood reaction from mother"   Trazodone Hcl     Other reaction(s): weak stream    Patient Measurements: Height: '6\' 5"'$  (195.6 cm) Weight: 81.8 kg (180 lb 5.4 oz) IBW/kg (Calculated) : 89.1  Heparin Dosing Weight: 89 kg  Vital Signs: Temp: 99 F (37.2 C) (11/19 0800) Temp Source: Axillary (11/19 0800) BP: 141/100 (11/19 0800) Pulse Rate: 61 (11/19 0800)  Labs: Recent Labs    12/12/21 0310 12/12/21 0830 12/13/21 0355 12/13/21 1600 12/14/21 0415  HGB 13.8  --  12.9*  --  12.7*  HCT 40.5  --  38.4*  --  37.2*  PLT 167  --  141*  --  148*  HEPARINUNFRC 0.41  --  0.29* 0.51 0.51  CREATININE 0.99 1.20 1.30*  --   --     Estimated Creatinine Clearance: 62.9 mL/min (A) (by C-G formula based on SCr of 1.3 mg/dL (H)).   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 Unable to take PO consistently, last dose reported on 11/09. Pharmacy consulted to manage heparin while Xarelto is on hold.  Heparin level 0.51 stable on 1200 units/hr. CBC stable, no further issues with heparin infusion or signs of bleeding reported.  Goal of Therapy:  Heparin level 0.3-0.7 units/ml Monitor platelets by anticoagulation protocol: Yes   Plan:  Continue heparin infusion at 1200 units/hr Check heparin level daily while on heparin Continue to monitor H&H and platelets    Thank you for allowing pharmacy to be a part of this patient's care.  Ardyth Harps, PharmD Clinical Pharmacist

## 2021-12-14 NOTE — Progress Notes (Addendum)
PROGRESS NOTE    Spencer Ross  MIW:[id number] DOB: 07-04-1950 DOA: 12/05/2021 PCP: Lujean Amel, MD    Brief Narrative:  71 year old male with a history of antiphospholipid antibody, pulmonary embolism in the past on chronic anticoagulation, recently had COVID-19 infection approximately 3 weeks PTA presented with progressive confusion over the past few weeks. MRI showed cortical restricted diffusion primarily in the right cerebral hemisphere, bilateral caudate heads and anterior putamina.  Neurology following.  He underwent lumbar puncture with several CSF studies in process. Infectious Meningitis/encephalitis panel noted to be negative.  He was started on high-dose IV steroids.  Overall mental status appears to be waxing and waning.  Nontunneled right IJ CVC placed by CCM 11/14.  Started alternate day plasma pheresis 11/15 through 11/24.  Ongoing waxing and waning mental status.  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  Repeat MRI brain with and without contrast on 11/15 without changes compared to prior. -Neurology following and their input much appreciated.  Neurology team to discuss case with [facility] colleagues 12/15/2021. -Continuous EEG did not show any epileptiform discharges initially (12th) - noted to be abnormal during the day 16th with questionable seizure overnight - long term EEG ongoing -Depakote dose increased 17th (previously on low dose for mood) -Lumbar puncture performed on 11/11.  Several studies are still currently in process -Completed 5-day course of high-dose Solu-Medrol 1 g daily on  11/15.  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  As per Dr. Rory Percy, many of these results are being directly coordinated with neurology team. -Repeat long term EEG pending 17th given overnight 'seizure-like' activity  Acute metabolic encephalopathy Dysphagia Moderate caloric malnutrition -Secondary to above -Medications transitioned to IV given inconsistent PO intake -Continue to follow with bedside swallow/speech as indicated -Dietary to follow given prolonged NPO status - discussed possible need for NG with family, they are agreeable if necessary  Antiphospholipid antibody syndrome History of PE -On chronic anticoagulation -Xarelto was on hold for lumbar puncture -Currently on heparin infusion. Restart Xarelto in 24-48h once oral route established and no further procedures are indicated   Anxiety -Continue on home dose of BuSpar  Asymptomatic sinus bradycardia: Telemetry shows SB in the 40s-50s. TSH normal.   No rate control medications. Clinically stable  Hypokalemia, resolved: Follow repeat labs  Recent COVID-19 infection -First diagnosed approximately 3 weeks ago -He has been vaccinated -He was taking steroids as well as ivermectin prior to admission -Currently not hypoxic and hemodynamically stable.  DVT prophylaxis: Heparin infusion Code Status: Full code Family Communication: Spouse and  son updated at bedside  Disposition Plan: Status is: Inpatient Remains inpatient appropriate because: Continued work-up and treatment including plasma exchange, of encephalopathy  Consultants:  Neurology PCCM for CVC  placement  Procedures:  Lumbar puncture 11/11 RIJ CVC placement 11/14  Antimicrobials:  All discontinued   Subjective: Questionable seizure noted by staff overnight, patient remains confused, poorly interactive following commands intermittently.  Nonverbal for myself and family at bedside.  Objective: Vitals:   12/13/21 1601 12/13/21 1943 12/13/21 1945 12/14/21 0417  BP: (!) 150/83 (!) 171/98 (!) 159/90 (!) 148/80  Pulse: 67 (!) 108 86 63  Resp: '14 20  18  '$ Temp: 98 F (36.7 C) 98.3 F (36.8 C)  98.2 F (36.8 C)  TempSrc:  Axillary  Axillary  SpO2: 98% 97%  97%  Weight:      Height:        Intake/Output Summary (Last 24 hours) at 12/14/2021 0740 Last data filed at 12/14/2021 0277 Gross per 24 hour  Intake 479.11 ml  Output 1600 ml  Net -1120.89 ml    Filed Weights   12/06/21 2217 12/08/21 0214 12/10/21 0901  Weight: 81.3 kg 81.8 kg 81.8 kg    Examination:  General exam: Young male, moderately built and nourished lying with restraints as described above, in bed, startles easily when nursing attempts to even check vital signs or MD tries to examine. Respiratory system: Clear to auscultation.  No increased work of breathing. Cardiovascular system: S1 and S2 heard, regular bradycardia.  No murmurs, JVD or pedal edema.  Telemetry personally reviewed: Overnight SB mostly in the 50s, SR in the 70s this morning.  No pauses or heart blocks noted. Gastrointestinal system: Abdomen is nondistended, soft and nontender. No organomegaly or masses felt. Normal bowel sounds heard. Central nervous system: Alert and oriented to self and spouse at bedside.  No cranial nerve deficits appreciated. Extremities: Symmetric 5 x 5 power. Skin: No rashes, lesions or  ulcers Psychiatry: Does not appear anxious or agitated.  Rest cannot be assessed due to AMS at this time.    Data Reviewed: I have personally reviewed following labs and imaging studies  CBC: Recent Labs  Lab 12/10/21 0350 12/11/21 0500 12/12/21 0310 12/13/21 0355 12/14/21 0415  WBC 10.2 9.6 9.0 9.8 9.5  HGB 13.0 13.8 13.8 12.9* 12.7*  HCT 39.2 40.2 40.5 38.4* 37.2*  MCV 90.7 89.1 89.2 89.9 89.9  PLT 192 174 167 141* 148*    Basic Metabolic Panel: Recent Labs  Lab 12/08/21 1021 12/09/21 0020 12/10/21 0924 12/11/21 0530 12/12/21 0310 12/12/21 0830 12/13/21 0355  NA 143   < > 143 138 144 145 145  K 3.2*   < > 3.3* 3.3* 3.8 4.0 3.6  CL 111   < > 112* 108 111 112* 114*  CO2 22   < > 21* '22 22 24 '$ 21*  GLUCOSE 94   < > 120* 94 96 106* 101*  BUN 21   < > '22 18 17 19 17  '$ CREATININE 0.87   < > 0.70 0.83 0.99 1.20 1.30*  CALCIUM 9.2   < > 8.7* 8.6* 8.9 8.9 8.8*  MG 2.0  --   --  2.3  --   --   --   PHOS 3.5  --   --   --  4.6  --   --    < > = values in this interval not displayed.    GFR: Estimated Creatinine Clearance: 62.9 mL/min (A) (by C-G formula based on SCr of 1.3 mg/dL (H)). Liver Function Tests: Recent Labs  Lab 12/08/21 1021 12/12/21 0310  AST 23  --   ALT 15  --   ALKPHOS  46  --   BILITOT 0.7  --   PROT 6.6  --   ALBUMIN 3.7 3.6    No results for input(s): "LIPASE", "AMYLASE" in the last 168 hours. Recent Labs  Lab 12/12/21 1636  AMMONIA 24     HbA1C: No results for input(s): "HGBA1C" in the last 72 hours.  CBG: Recent Labs  Lab 12/12/21 2345 12/13/21 0733 12/13/21 1150 12/13/21 1602 12/13/21 2033  GLUCAP 86 98 106* 81 116*      Recent Results (from the past 240 hour(s))  CSF culture w Gram Stain     Status: None   Collection Time: 12/06/21  9:35 PM   Specimen: CSF; Cerebrospinal Fluid  Result Value Ref [place] Status   Specimen Description CSF  Final   Special Requests TUBE 2 POSSIBLE CJD  Final   Gram Stain   Final     CYTOSPIN SMEAR WBC PRESENT, PREDOMINANTLY MONONUCLEAR NO ORGANISMS SEEN    Culture   Final    NO GROWTH 3 DAYS Performed at [facility], [address] [place], [place] [address]    Report Status 12/10/2021 FINAL  Final  Fungus Culture With Stain     Status: None (Preliminary result)   Collection Time: 12/06/21  9:35 PM   Specimen: Lumbar Puncture; Cerebrospinal Fluid  Result Value Ref [place] Status   Fungus Stain Final report  Final    Comment: (NOTE) Performed At: [facility] [address], [place] [id number] Rush Farmer MD UX:[id number]    Fungus (Mycology) Culture PENDING  Incomplete   Fungal Source CSF  Final    Comment: Performed at [facility], [place] [address], [place], [place] [address]  Culture, fungus without smear     Status: None (Preliminary result)   Collection Time: 12/06/21  9:35 PM   Specimen: CSF; Cerebrospinal Fluid  Result Value Ref [place] Status   Specimen Description CSF  Final   Special Requests POSSIBLE CJD  Final   Culture   Final    NO FUNGUS ISOLATED AFTER 7 DAYS Performed at [facility], [place] [address] [place], [place] [address]    Report Status PENDING  Incomplete  Fungus Culture Result     Status: None   Collection Time: 12/06/21  9:35 PM  Result Value Ref [place] Status   Result 1 Comment  Final    Comment: (NOTE) KOH/Calcofluor preparation:  no fungus observed. Performed At: [facility] [place], [place] [id number] Rush Farmer MD [id number]   MRSA Next Gen by PCR, Nasal     Status: None   Collection Time: 12/06/21 11:25 PM   Specimen: Nasal Mucosa; Nasal Swab  Result Value Ref [place] Status   MRSA by PCR Next Gen NOT DETECTED NOT DETECTED Final    Comment: (NOTE) The GeneXpert MRSA Assay (FDA approved for NASAL specimens only), is one component of a comprehensive MRSA colonization surveillance program. It is not intended to diagnose MRSA infection nor to guide or  monitor treatment for MRSA infections. Test performance is not FDA approved in patients less than 53 years old. Performed at [facility], [place] [address], [place],  [address]     Radiology Studies: Overnight EEG with video  Result Date: 12/13/2021 Lora Havens, MD     12/13/2021 10:22 AM Patient Name: Spencer Ross MRN: [id number] Epilepsy Attending: Lora Havens Referring Physician/Provider: Lorenza Chick, MD Duration: 12/13/2021 0237 to 12/13/2021 1014  Patient  history: 71yo M with ams. EEG to evaluate for seizure  Level of alertness: Awake, asleep  AEDs during EEG study: VPA  Technical aspects: This EEG study was done with scalp electrodes positioned according to the 10-20 International system of electrode placement. Electrical activity was reviewed with band pass filter of 1-'70Hz'$ , sensitivity of 7 uV/mm, display speed of 6m/sec with a '60Hz'$  notched filter applied as appropriate. EEG data were recorded continuously and digitally stored.  Video monitoring was available and reviewed as appropriate.  Description: No clear posterior dominant rhythm was seen. Sleep was characterized by sleep spindles (12-'14hz'$ ), maximal fronto-central region. EEG also showed continuous generalized 3 to 7 Hz theta-delta slowing in left hemisphere and 2-'3Hz'$  delta slowing in right hemisphere. Sharp waves were noted in right hemisphere, maximal right temporal region qasi-periodic at 1 hz. Hyperventilation and photic stimulation were not performed.    ABNORMALITY - Sharp waves,  right hemisphere - Continuous slow, generalized and lateralized right hemisphere  IMPRESSION: This study showed evidence of epileptogenicity and cortical dysfunction arising from right hemisphere. Additionally there is moderate diffuse encephalopathy, nonspecific etiology. No seizures were seen throughout the recording.  PLora Havens   EEG adult  Result Date: 12/12/2021 YLora Havens MD     12/12/2021 11:27 AM  Patient Name: JKEYONTAE HUCKEBYMRN: [id number] Attending: PLora HavensReferring Physician/Provider: AAmie Portland MD Date: 12/11/2021 Duration: 22.37 mins Patient history: 89yoM with ams. EEG to evaluate for seizure Level of alertness: Awake AEDs during EEG study: VPA Technical aspects: This EEG study was done with scalp electrodes positioned according to the 10-20 International system of electrode placement. Electrical activity was reviewed with band pass filter of 1-'70Hz'$ , sensitivity of 7 uV/mm, display speed of 344msec with a '60Hz'$  notched filter applied as appropriate. EEG data were recorded continuously and digitally stored.  Video monitoring was available and reviewed as appropriate. Description: .EEG also showed continuous generalized and lateralized right hemisphere 3 to 6 Hz theta-delta slowing. Lateralized periodic discharges were noted in right hemisphere at '1hz'$ .  Hyperventilation and photic stimulation were not performed.   ABNORMALITY - Lateralized periodic discharges ( LPD), right hemisphere - Continuous slow, generalized and lateralized right hemisphere IMPRESSION: This study showed evidence of epileptogenicity and cortical dysfunction arising from right hemisphere likely due to underlying structural abnormality and increased risk of seizure recurrence. Additionally there is moderate diffuse encephalopathy, nonspecific etiology. No seizures were seen throughout the recording. EEG appears to be worsening compared to previous study on 12/07/2021 Priyanka O Yadav    Scheduled Meds:  Chlorhexidine Gluconate Cloth  6 each Topical Daily   pantoprazole (PROTONIX) IV  40 mg Intravenous Q24H   sodium chloride flush  3 mL Intravenous Q12H   Continuous Infusions:  sodium chloride     0.9 % NaCl with KCl 40 mEq / L 75 mL/hr at 12/14/21 0419   heparin 1,200 Units/hr (12/14/21 0159)   valproate sodium Stopped (12/13/21 2231)    LOS: 8 days   Time spent: 3520m  Holmes Hays DO    To contact the attending provider between 7A-7P or the covering provider during after hours 7P-7A, please log into the web site [email or web address] and access using universal [place] password for that web site. If you do not have the password, please call the hospital operator.

## 2021-12-15 DIAGNOSIS — G9340 Encephalopathy, unspecified: Secondary | ICD-10-CM | POA: Diagnosis not present

## 2021-12-15 LAB — CBC
HCT: 37.5 % — ABNORMAL LOW (ref 39.0–52.0)
Hemoglobin: 12.5 g/dL — ABNORMAL LOW (ref 13.0–17.0)
MCH: 30.6 pg (ref 26.0–34.0)
MCHC: 33.3 g/dL (ref 30.0–36.0)
MCV: 91.7 fL (ref 80.0–100.0)
Platelets: 166 10*3/uL (ref 150–400)
RBC: 4.09 MIL/uL — ABNORMAL LOW (ref 4.22–5.81)
RDW: 13.7 % (ref 11.5–15.5)
WBC: 12.1 10*3/uL — ABNORMAL HIGH (ref 4.0–10.5)
nRBC: 0 % (ref 0.0–0.2)

## 2021-12-15 LAB — HEPARIN LEVEL (UNFRACTIONATED)
Heparin Unfractionated: 0.77 IU/mL — ABNORMAL HIGH (ref 0.30–0.70)
Heparin Unfractionated: 0.34 IU/mL (ref 0.30–0.70)

## 2021-12-15 LAB — GLUCOSE, CAPILLARY
Glucose-Capillary: 111 mg/dL — ABNORMAL HIGH (ref 70–99)
Glucose-Capillary: 86 mg/dL (ref 70–99)
Glucose-Capillary: 107 mg/dL — ABNORMAL HIGH (ref 70–99)
Glucose-Capillary: 104 mg/dL — ABNORMAL HIGH (ref 70–99)

## 2021-12-15 MED ORDER — DIPHENHYDRAMINE HCL 25 MG PO CAPS: 25.0000 mg | ORAL_CAPSULE | Freq: Four times a day (QID) | ORAL | Status: DC | PRN

## 2021-12-15 MED ORDER — HYDRALAZINE HCL 20 MG/ML IJ SOLN
5.0000 mg | INTRAMUSCULAR | Status: AC | PRN
  Administered 2021-12-15 – 2021-12-16 (×2): 5 mg via INTRAVENOUS
  Filled 2021-12-15 (×2): qty 1

## 2021-12-15 MED ORDER — HEPARIN SODIUM (PORCINE) 1000 UNIT/ML IJ SOLN
1000.0000 [IU] | Freq: Once | INTRAMUSCULAR | Status: AC
  Administered 2021-12-15: 1000 [IU]
  Filled 2021-12-15: qty 1

## 2021-12-15 MED ORDER — CALCIUM CARBONATE ANTACID 500 MG PO CHEW
2.0000 | CHEWABLE_TABLET | ORAL | Status: DC
  Administered 2021-12-15: 400 mg via ORAL
  Filled 2021-12-15: qty 2

## 2021-12-15 MED ORDER — CALCIUM GLUCONATE-NACL 2-0.675 GM/100ML-% IV SOLN
2.0000 g | Freq: Once | INTRAVENOUS | Status: AC
  Administered 2021-12-15: 2000 mg via INTRAVENOUS
  Filled 2021-12-15: qty 100

## 2021-12-15 MED ORDER — ACETAMINOPHEN 325 MG PO TABS: 650.0000 mg | ORAL_TABLET | ORAL | Status: DC | PRN

## 2021-12-15 MED ORDER — SODIUM CHLORIDE 0.9 % IV SOLN
INTRAVENOUS | Status: AC
  Filled 2021-12-15 (×4): qty 200

## 2021-12-15 MED ORDER — ACD FORMULA A 0.73-2.45-2.2 GM/100ML VI SOLN
1000.0000 mL | Status: DC
  Administered 2021-12-15: 1000 mL
  Filled 2021-12-15: qty 1000

## 2021-12-15 NOTE — Progress Notes (Addendum)
PROGRESS NOTE    Spencer Ross  ZOX:[id number] DOB: [birth year] DOA: 12/05/2021 PCP: Lujean Amel, MD    Brief Narrative:  71 year old male with a history of antiphospholipid antibody, pulmonary embolism in the past on chronic anticoagulation, recently had COVID-19 infection approximately 3 weeks PTA presented with progressive confusion over the past few weeks. MRI showed cortical restricted diffusion primarily in the right cerebral hemisphere, bilateral caudate heads and anterior putamina.  Neurology following.  He underwent lumbar puncture with several CSF studies in process. Infectious Meningitis/encephalitis panel noted to be negative.  He was started on high-dose IV steroids.  Overall mental status appears to be waxing and waning.  Nontunneled right IJ CVC placed by CCM 11/14.  Started alternate day plasma pheresis 11/15 through 11/24.  Ongoing waxing and waning mental status.  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  Repeat MRI brain with and without contrast on 11/15 without changes compared to prior. -Neurology following and their input much appreciated.  Neurology team to discuss case with [place] colleague (Dr. Pearletha Forge) 12/15/2021. -Continuous EEG did not show any epileptiform discharges initially (12th) - noted to be abnormal during the day 16th with questionable seizure overnight - long term EEG ongoing -Depakote dose increased 17th (previously on low dose for mood) -Lumbar puncture performed on 11/11.  Several studies are still currently in process -Completed 5-day course of high-dose Solu-Medrol  1 g daily on 11/15.  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  As per Dr. Rory Percy, many of these results are being directly coordinated with neurology team. -Repeat long term EEG pending 17th given overnight 'seizure-like' activity  Acute metabolic encephalopathy Dysphagia Moderate caloric malnutrition -Secondary to above -Medications transitioned to IV given inconsistent PO intake -Continue to follow with bedside swallow/speech as indicated -Dietary to follow given prolonged NPO status -  -Family has agreed to NG tube given prolonged mental status changes and poor PO intake. Cortrak to be placed 11/20 - hopefully can remove it after he finishes plasmapheresis.  Antiphospholipid antibody syndrome History of PE -On chronic anticoagulation -Xarelto was on hold for lumbar puncture -Currently on heparin infusion. Restart Xarelto in 24-48h once oral route established and no further procedures are indicated   Anxiety -Continue on home dose of BuSpar  Asymptomatic sinus bradycardia: Telemetry shows SB in the 40s-50s. TSH normal.   No rate control medications. Clinically stable  Hypokalemia, resolved: Follow repeat labs  Recent COVID-19 infection -First diagnosed approximately 3 weeks ago -He has been vaccinated -He was taking steroids as well as ivermectin prior to admission -Currently not hypoxic and  hemodynamically stable.  DVT prophylaxis: Heparin infusion Code Status: Full code Family Communication: Spouse and son updated at bedside  Disposition Plan: Status is: Inpatient  Remains inpatient appropriate because: Continued work-up and treatment including plasma exchange, of encephalopathy  Consultants:  Neurology PCCM for CVC placement  Procedures:  Lumbar puncture 11/11 RIJ CVC placement 11/14  Antimicrobials:  All discontinued   Subjective: Questionable seizure noted by staff overnight, patient remains confused, poorly interactive following commands intermittently.  Nonverbal for myself and family at bedside.  Objective: Vitals:   12/14/21 0800 12/14/21 1300 12/14/21 1950 12/14/21 2316  BP: (!) 141/100 (!) 137/95 (!) 151/77 (!) 173/78  Pulse: 61 66  (!) 48  Resp: '14 18 17 16  '$ Temp: 99 F (37.2 C)  99.1 F (37.3 C) 99.3 F (37.4 C)  TempSrc: Axillary  Axillary Oral  SpO2: 95% 96% 98%   Weight:      Height:        Intake/Output Summary (Last 24 hours) at 12/15/2021 0729 Last data filed at 12/15/2021 2947 Gross per 24 hour  Intake 4414.47 ml  Output 2150 ml  Net 2264.47 ml    Filed Weights   12/06/21 2217 12/08/21 0214 12/10/21 0901  Weight: 81.3 kg 81.8 kg 81.8 kg    Examination:  General exam: Young male, moderately built and nourished lying with restraints as described above, in bed, startles easily when nursing attempts to even check vital signs or MD tries to examine. Respiratory system: Clear to auscultation.  No increased work of breathing. Cardiovascular system: S1 and S2 heard, regular bradycardia.  No murmurs, JVD or pedal edema.  Telemetry personally reviewed: Overnight SB mostly in the 50s, SR in the 70s this morning.  No pauses or heart blocks noted. Gastrointestinal system: Abdomen is nondistended, soft and nontender. No organomegaly or masses felt. Normal bowel sounds heard. Central nervous system: Alert and oriented to self and spouse at  bedside.  No cranial nerve deficits appreciated. Extremities: Symmetric 5 x 5 power. Skin: No rashes, lesions or ulcers Psychiatry: Does not appear anxious or agitated.  Rest cannot be assessed due to AMS at this time.    Data Reviewed: I have personally reviewed following labs and imaging studies  CBC: Recent Labs  Lab 12/11/21 0500 12/12/21 0310 12/13/21 0355 12/14/21 0415 12/15/21 0430  WBC 9.6 9.0 9.8 9.5 12.1*  HGB 13.8 13.8 12.9* 12.7* 12.5*  HCT 40.2 40.5 38.4* 37.2* 37.5*  MCV 89.1 89.2 89.9 89.9 91.7  PLT 174 167 141* 148* 654    Basic Metabolic Panel: Recent Labs  Lab 12/08/21 1021 12/09/21 0020 12/10/21 0924 12/11/21 0530 12/12/21 0310 12/12/21 0830 12/13/21 0355  NA 143   < > 143 138 144 145 145  K 3.2*   < > 3.3* 3.3* 3.8 4.0 3.6  CL 111   < > 112* 108 111 112* 114*  CO2 22   < > 21* '22 22 24 '$ 21*  GLUCOSE 94   < > 120* 94 96 106* 101*  BUN 21   < > '22 18 17 19 17  '$ CREATININE 0.87   < > 0.70 0.83 0.99 1.20 1.30*  CALCIUM 9.2   < > 8.7* 8.6* 8.9 8.9 8.8*  MG 2.0  --   --  2.3  --   --   --   PHOS 3.5  --   --   --  4.6  --   --    < > = values in this interval not displayed.    GFR: Estimated Creatinine Clearance: 62.9 mL/min (A) (by C-G formula based on SCr of 1.3 mg/dL (H)). Liver Function Tests: Recent Labs  Lab 12/08/21 1021 12/12/21 0310  AST 23  --   ALT 15  --   ALKPHOS 46  --   BILITOT 0.7  --   PROT 6.6  --   ALBUMIN 3.7 3.6    No results for input(s): "LIPASE", "AMYLASE" in the last 168 hours. Recent Labs  Lab 12/12/21 1636  AMMONIA 24     HbA1C: No results for input(s): "HGBA1C" in the last 72 hours.  CBG: Recent Labs  Lab 12/13/21 1602 12/13/21 2033 12/14/21 0917 12/14/21 2108 12/15/21 0603  GLUCAP 81 116* 106* 119* 111*      Recent Results (from the past 240 hour(s))  CSF culture w Gram Stain     Status: None   Collection Time: 12/06/21  9:35 PM   Specimen: CSF; Cerebrospinal Fluid  Result Value Ref  Range Status   Specimen Description CSF  Final   Special Requests TUBE 2 POSSIBLE CJD  Final   Gram Stain   Final    CYTOSPIN SMEAR WBC PRESENT, PREDOMINANTLY MONONUCLEAR NO ORGANISMS SEEN    Culture   Final    NO GROWTH 3 DAYS Performed at [facility], [place] [address] [place], [place] [address]    Report Status 12/10/2021 FINAL  Final  Fungus Culture With Stain     Status: None (Preliminary result)   Collection Time: 12/06/21  9:35 PM   Specimen: Lumbar Puncture; Cerebrospinal Fluid  Result Value Ref Range Status   Fungus Stain Final report  Final    Comment: (NOTE) Performed At: [facility] [address], [place] [id number] Rush Farmer MD [id number]    Fungus (Mycology) Culture PENDING  Incomplete   Fungal Source CSF  Final    Comment: Performed at [facility], [place] [address] [place], [place] [address]  Culture, fungus without smear     Status: None (Preliminary result)   Collection Time: 12/06/21  9:35 PM   Specimen: CSF; Cerebrospinal Fluid  Result Value Ref Range Status   Specimen Description CSF  Final   Special Requests POSSIBLE CJD  Final   Culture   Final    NO FUNGUS ISOLATED AFTER 8 DAYS Performed at [facility], [place] [address] [place], [place] [address]    Report Status PENDING  Incomplete  Fungus Culture Result     Status: None   Collection Time: 12/06/21  9:35 PM  Result Value Ref Range Status   Result 1 Comment  Final    Comment: (NOTE) KOH/Calcofluor preparation:  no fungus observed. Performed At: [facility] [place], [place] [id number] Rush Farmer MD UQ:[id number]   MRSA Next Gen by PCR, Nasal     Status: None   Collection Time: 12/06/21 11:25 PM   Specimen: Nasal Mucosa; Nasal Swab  Result Value Ref Range Status   MRSA by PCR Next Gen NOT DETECTED NOT DETECTED Final    Comment: (NOTE) The GeneXpert MRSA Assay (FDA approved for NASAL specimens only), is one component of a  comprehensive MRSA colonization surveillance program. It is not intended to diagnose MRSA infection nor to guide or monitor treatment for MRSA infections. Test performance is not FDA approved in patients less than 30 years old. Performed at [facility], [place] [address] [place], [place] [address]     Radiology Studies: Overnight EEG with video  Result Date: 12/13/2021 Lora Havens, MD     12/13/2021 10:22 AM Patient Name: Spencer Ross MRN: [id number]  Epilepsy Attending: Lora Havens Referring Physician/Provider: Lorenza Chick, MD Duration: 12/13/2021 4585 to 12/13/2021 1014  Patient history: 71yo M with ams. EEG to evaluate for seizure  Level of alertness: Awake, asleep  AEDs during EEG study: VPA  Technical aspects: This EEG study was done with scalp electrodes positioned according to the 10-20 International system of electrode placement. Electrical activity was reviewed with band pass filter of 1-'70Hz'$ , sensitivity of 7 uV/mm, display speed of 78m/sec with a '60Hz'$  notched filter applied as appropriate. EEG data were recorded continuously and digitally stored.  Video monitoring was available and reviewed as appropriate.  Description: No clear posterior dominant rhythm was seen. Sleep was characterized by sleep spindles (12-'14hz'$ ), maximal fronto-central region. EEG also showed continuous generalized 3 to 7 Hz theta-delta slowing in left hemisphere and 2-'3Hz'$  delta slowing in right hemisphere. Sharp waves were noted in right hemisphere, maximal right temporal region qasi-periodic at 1 hz. Hyperventilation and photic stimulation were not performed.    ABNORMALITY - Sharp waves,  right hemisphere - Continuous slow, generalized and lateralized right hemisphere  IMPRESSION: This study showed evidence of epileptogenicity and cortical dysfunction arising from right hemisphere. Additionally there is moderate diffuse encephalopathy, nonspecific etiology. No seizures were seen throughout the  recording.  Priyanka OBarbra Sarks    Scheduled Meds:  calcium carbonate  2 tablet Oral Q3H   Chlorhexidine Gluconate Cloth  6 each Topical Daily   heparin sodium (porcine)  1,000 Units Intracatheter Once   pantoprazole (PROTONIX) IV  40 mg Intravenous Q24H   sodium chloride flush  3 mL Intravenous Q12H   Continuous Infusions:  sodium chloride     0.9 % NaCl with KCl 40 mEq / L 75 mL/hr at 12/14/21 2107   albumin human 25 % 50 g in sodium chloride 0.9 %     calcium gluconate     citrate dextrose     heparin 1,200 Units/hr (12/14/21 2336)   valproate sodium 500 mg (12/14/21 2155)    LOS: 9 days   Time spent: 344ms  Shahana Capes DO   To contact the attending provider between 7A-7P or the covering provider during after hours 7P-7A, please log into the web site [email or web address] and access using universal [place] password for that web site. If you do not have the password, please call the hospital operator.

## 2021-12-15 NOTE — Progress Notes (Signed)
ANTICOAGULATION CONSULT NOTE  Pharmacy Consult for Heparin Indication:  APLS with h/o PE  Allergies  Allergen Reactions   Diphenhydramine     Other reaction(s): prostate swelling, increase urination   Penicillins Other (See Comments)    "childhood reaction from mother"   Trazodone Hcl     Other reaction(s): weak stream    Patient Measurements: Height: '6\' 5"'$  (195.6 cm) Weight: 81.8 kg (180 lb 5.4 oz) IBW/kg (Calculated) : 89.1  Heparin Dosing Weight: 89 kg  Vital Signs: Temp: 98.5 F (36.9 C) (11/20 1200) Temp Source: Axillary (11/20 1200) BP: 137/80 (11/20 1200) Pulse Rate: 60 (11/20 1200)  Labs: Recent Labs    12/13/21 0355 12/13/21 1600 12/14/21 0415 12/15/21 0430 12/15/21 1544  HGB 12.9*  --  12.7* 12.5*  --   HCT 38.4*  --  37.2* 37.5*  --   PLT 141*  --  148* 166  --   HEPARINUNFRC 0.29*   < > 0.51 0.77* 0.34  CREATININE 1.30*  --   --   --   --    < > = values in this interval not displayed.    Estimated Creatinine Clearance: 62.9 mL/min (A) (by C-G formula based on SCr of 1.3 mg/dL (H)).   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 Unable to take PO consistently, last dose reported on 11/09. Pharmacy consulted to manage heparin while Xarelto is on hold.  Heparin level therapeutic at 0.34 after reduction to 1100 units/hr.  CBC stable, no further issues with heparin infusion or signs of bleeding reported.   Goal of Therapy:  Heparin level 0.3-0.7 units/ml Monitor platelets by anticoagulation protocol: Yes   Plan: Continue heparin infusion to 1100 units/hr Continue to monitor daily heparin levels while on therapy Continue to monitor H&H and platelets  Thank you for allowing pharmacy to be a part of this patient's care.  Sloan Leiter, PharmD, BCPS, BCCCP Clinical Pharmacist Please refer to [facility] for [place] numbers 12/15/2021  4:29 PM

## 2021-12-15 NOTE — Progress Notes (Signed)
Pt continues with plasmapheresis through 11/24.  TOC following.

## 2021-12-15 NOTE — Progress Notes (Signed)
Patient completed apheresis treatment 3 of 5 without complications or adverse events.   VSS post treatment: B/P-135/74, HR-59, R-18, T-98.2 (axillary), O2- 98% Patients next treatment is scheduled for Wednesday, 11/22.  Report given to floor RN Brantley Fling.

## 2021-12-15 NOTE — Progress Notes (Signed)
ANTICOAGULATION CONSULT NOTE  Pharmacy Consult for Heparin Indication:  APLS with h/o PE  Allergies  Allergen Reactions   Diphenhydramine     Other reaction(s): prostate swelling, increase urination   Penicillins Other (See Comments)    "childhood reaction from mother"   Trazodone Hcl     Other reaction(s): weak stream    Patient Measurements: Height: '6\' 5"'$  (195.6 cm) Weight: 81.8 kg (180 lb 5.4 oz) IBW/kg (Calculated) : 89.1  Heparin Dosing Weight: 89 kg  Vital Signs: Temp: 99.3 F (37.4 C) (11/19 2316) Temp Source: Oral (11/19 2316) BP: 173/78 (11/19 2316) Pulse Rate: 48 (11/19 2316)  Labs: Recent Labs    12/12/21 0830 12/13/21 0355 12/13/21 0355 12/13/21 1600 12/14/21 0415 12/15/21 0430  HGB  --  12.9*   < >  --  12.7* 12.5*  HCT  --  38.4*  --   --  37.2* 37.5*  PLT  --  141*  --   --  148* 166  HEPARINUNFRC  --  0.29*   < > 0.51 0.51 0.77*  CREATININE 1.20 1.30*  --   --   --   --    < > = values in this interval not displayed.     Estimated Creatinine Clearance: 62.9 mL/min (A) (by C-G formula based on SCr of 1.3 mg/dL (H)).   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 Unable to take PO consistently, last dose reported on 11/09. Pharmacy consulted to manage heparin while Xarelto is on hold.  Heparin level 0.51 stable on 1200 units/hr. CBC stable, no further issues with heparin infusion or signs of bleeding reported.  11/20 AM update: HL 0.77 (supratherapeutic) No signs of bleeding or issues with the line  Goal of Therapy:  Heparin level 0.3-0.7 units/ml Monitor platelets by anticoagulation protocol: Yes   Plan: Decrease heparin infusion to 1100 units/hr Check heparin level in 8 hours and daily while on heparin Continue to monitor H&H and platelets    Thank you for allowing pharmacy to be a part of this patient's care.  Vaughan Basta BS, PharmD, BCPS Clinical Pharmacist 12/15/2021 7:16 AM  Contact: [phone] after 3 PM  "Be curious, not judgmental..." -Jamal Maes

## 2021-12-15 NOTE — Progress Notes (Signed)
Neurology Progress Note   Subjective: Patient evaluated in HD. Nonverbal today, per RN patient has not spoken today. No appreciable tracking or response to verbal commands. Appeared lethargic but withdrew to light touch, was not cooperative on exam.    Exam: Vitals:   12/15/21 0922 12/15/21 0940  BP: (!) 144/83 (!) 146/79  Pulse: (!) 56   Resp: 14 15  Temp: 98.1 F (36.7 C) 98.3 F (36.8 C)  SpO2:     General: Comfortably sitting in bed, appears lethargic.  HEENT: Normocephalic/atraumatic Lungs: Clear Cardiovascular: Regular rhythm Neurological exam He is awake, not alert.  Did not follow any verbal commands, nonverbal.  Cranial nerves: Unable to assess pupils, patient closed eyes shut and resisted opening them for exam. No purposeful EOM, reacts to visual threat. Face appears grossly symmetric.  UE restraints with mits in place. Moves BLE grossly equally.  Sensation intact to touch and noxious stimulation  Pertinent Labs: CBC    Component Value Date/Time   WBC 12.1 (H) 12/15/2021 0430   RBC 4.09 (L) 12/15/2021 0430   HGB 12.5 (L) 12/15/2021 0430   HGB 14.0 07/14/2013 1037   HCT 37.5 (L) 12/15/2021 0430   HCT 42.2 07/14/2013 1037   PLT 166 12/15/2021 0430   PLT 171 07/14/2013 1037   MCV 91.7 12/15/2021 0430   MCV 89.0 07/14/2013 1037   MCH 30.6 12/15/2021 0430   MCHC 33.3 12/15/2021 0430   RDW 13.7 12/15/2021 0430   RDW 13.8 07/14/2013 1037   LYMPHSABS 1.3 07/14/2013 1037   MONOABS 0.3 07/14/2013 1037   EOSABS 0.2 07/14/2013 1037   BASOSABS 0.0 07/14/2013 1037       Latest Ref Rng & Units 12/13/2021    3:55 AM 12/12/2021    8:30 AM 12/12/2021    3:10 AM  BMP  Glucose 70 - 99 mg/dL 101  106  96   BUN 8 - 23 mg/dL '17  19  17   '$ Creatinine 0.61 - 1.24 mg/dL 1.30  1.20  0.99   Sodium 135 - 145 mmol/L 145  145  144   Potassium 3.5 - 5.1 mmol/L 3.6  4.0  3.8   Chloride 98 - 111 mmol/L 114  112  111   CO2 22 - 32 mmol/L '21  24  22   '$ Calcium 8.9 - 10.3 mg/dL  8.8  8.9  8.9      Workup summary: Negative CTA chest/abdomen/pelvis with contrast for malignancy screening  LTM negative for seizures  CSF studies:    WBC 0/0, RBC 0/0, protein and glucose pending due to concern for CJD M/E panel negative (cryptococcus, CMV, enterovirus, initiation coli K1, H. influenzae, HSV-1, HSV-2, HHV-6, human per echovirus, Listeria, Neisseria, Streptococcus agalactiae and pneumoniae, VZV)  Gram stain negative Bacterial culture no growth in 3 days, fungal culture pending  Pending: Beta Isoform Creutzfeldt Jacob disease, 14 3 3, RT quic   Oligoclonal bands, IgG index   Autoimmune encephalitis panel on CSF   Lyme antigen CSF  Serum studies:   TSH normal, ammonia normal, B12 426,  Pending: MMA in process, B1 in process   HIV negative, RPR negative  Antinuclear antibody negative Sjogren's negative Thyroid antibodies -TPO negative but thyroglobulin antibodies 4.0-normal up to 0.9.   Neurodiagnostics: EEG: 12/11/2021-evidence of epileptogenicity and cortical dysfunction from the right hemisphere likely due to underlying structural abnormality and increased risk of seizure recurrence.  Additionally moderate diffuse encephalopathy nonspecific etiology.  EEG appears to be worsening compared to previous study on  12/07/2021.  Impression:  71 year old man past history of recent COVID infection 3 weeks prior to presentation followed by confusion, also has past history of hyperlipidemia, PE, antiphospholipid antibody syndrome on Xarelto who presented with changes in his personality and bizarre behavior since he got COVID.   MRI brain showed cortical ribbon restricted diffusion-primarily in the right cerebral hemisphere as well as in bilateral caudate heads and anterior putamen.   EEG with right hemispheric slowing.  CT chest abdomen pelvis no occult malignancy While in the hospital, neurology team observed movements concerning for facial brachial dystonic seizures raising  concern for LGI 1 encephalitis, along with other differentials of CJD based on imaging and clinical findings. He was started on steroids, initially showed some improvement but then again had agitation and continues to have altered mental status.  Completed 5 days of high-dose IV methylprednisolone.  Literature review revealed some support for using a second modality such as plasma exchange or IVIG in addition to steroids-given his antiphospholipid antibody syndrome, would avoid IVIG for the risk of hypercoagulability. Discussed with the family about starting plasma exchange which they agreed to. Work-up unremarkable thus far but thyroglobulin antibodies are positive Concern for seizure to nights ago - started on LTM that showed slowing only.  LTM was then discontinued He was put on low-dose Depakote for his agitation a few days into admission and this was increased to therapeutic dose after concern for seizure-like activity witnessed by the staff couple days ago. Today appears to be much more awake and communicative although still slow to respond to questions.  He has had a waxing and waning mental status where on some days he has completely not talk to me at all and other days he was able to tell me his name, current month and the fact that he is at [facility] along with trying to follow simple commands.  I suspect this is all part of the underlying encephalitis versus degenerative process.  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 Positive thyroglobulin antibodies  Recommendations: - Pulse dose steroids, completed  11/11 - 11/15 -Plasma exchange-first round on 12/10/2021.  Subsequent rounds on 12/12/2021, 12/15/2021 12/17/2021 on 12/19/2021-for a total of 5 rounds of plasma exchange, 1 every other day. -With thyroglobulin antibodies  positive, unsure if there is a component of that for the encephalitis-has already been treated with steroids and is getting plasma exchange. -Continue Depakote 500 BID for now. -family wants to speak to [facility] Monday - we will coordinate with family-Dr. Pearletha Forge is the specialist at [facility].  Dr. Curly Shores, who is a [employer] alumnus would attempt contact with the specialist on Monday. Neurology will continue to follow with you    Christene Slates, MD PGY-1    I have seen the patient and reviewed the above note by Dr. Deveron Furlong.  This is a very complicated case.  Family reports that he has been talking some today, though I am not able to get him to.  Today is his third round of plasma exchange.  Dr. Curly Shores has reached out to a colleague at [facility], though for now plasma exchange would be the primary treatment modality.  Neurology will follow.  Roland Rack, MD [facility] [phone]  If 7pm- 7am, please page neurology on call as listed in [place].

## 2021-12-16 DIAGNOSIS — G9340 Encephalopathy, unspecified: Secondary | ICD-10-CM | POA: Diagnosis not present

## 2021-12-16 LAB — BASIC METABOLIC PANEL
Anion gap: 10 (ref 5–15)
BUN: 26 mg/dL — ABNORMAL HIGH (ref 8–23)
CO2: 20 mmol/L — ABNORMAL LOW (ref 22–32)
Calcium: 8.9 mg/dL (ref 8.9–10.3)
Chloride: 117 mmol/L — ABNORMAL HIGH (ref 98–111)
Creatinine, Ser: 2.09 mg/dL — ABNORMAL HIGH (ref 0.61–1.24)
GFR, Estimated: 34 mL/min — ABNORMAL LOW (ref >60)
Glucose, Bld: 125 mg/dL — ABNORMAL HIGH (ref 70–99)
Potassium: 4.3 mmol/L (ref 3.5–5.1)
Sodium: 147 mmol/L — ABNORMAL HIGH (ref 135–145)

## 2021-12-16 LAB — GLUCOSE, CAPILLARY
Glucose-Capillary: 133 mg/dL — ABNORMAL HIGH (ref 70–99)
Glucose-Capillary: 114 mg/dL — ABNORMAL HIGH (ref 70–99)
Glucose-Capillary: 100 mg/dL — ABNORMAL HIGH (ref 70–99)
Glucose-Capillary: 106 mg/dL — ABNORMAL HIGH (ref 70–99)

## 2021-12-16 LAB — CBC
HCT: 37.5 % — ABNORMAL LOW (ref 39.0–52.0)
Hemoglobin: 12.7 g/dL — ABNORMAL LOW (ref 13.0–17.0)
MCH: 31.2 pg (ref 26.0–34.0)
MCHC: 33.9 g/dL (ref 30.0–36.0)
MCV: 92.1 fL (ref 80.0–100.0)
Platelets: 168 10*3/uL (ref 150–400)
RBC: 4.07 MIL/uL — ABNORMAL LOW (ref 4.22–5.81)
RDW: 14.3 % (ref 11.5–15.5)
WBC: 19.3 10*3/uL — ABNORMAL HIGH (ref 4.0–10.5)
nRBC: 0 % (ref 0.0–0.2)

## 2021-12-16 LAB — HEPARIN LEVEL (UNFRACTIONATED): Heparin Unfractionated: 0.6 IU/mL (ref 0.30–0.70)

## 2021-12-16 LAB — OLIGOCLONAL BANDS, CSF + SERM

## 2021-12-16 NOTE — Progress Notes (Signed)
ANTICOAGULATION CONSULT NOTE- follow-up  Pharmacy Consult for Heparin Indication:  APLS with h/o PE  Allergies  Allergen Reactions   Diphenhydramine     Other reaction(s): prostate swelling, increase urination   Penicillins Other (See Comments)    "childhood reaction from mother"   Trazodone Hcl     Other reaction(s): weak stream    Patient Measurements: Height: '6\' 5"'$  (195.6 cm) Weight: 81.8 kg (180 lb 5.4 oz) IBW/kg (Calculated) : 89.1  Heparin Dosing Weight: 89 kg  Vital Signs: Temp: 98.5 F (36.9 C) (11/21 0338) Temp Source: Oral (11/21 0338) BP: 142/79 (11/21 0600) Pulse Rate: 61 (11/21 0338)  Labs: Recent Labs    12/14/21 0415 12/15/21 0430 12/15/21 1544 12/16/21 0422  HGB 12.7* 12.5*  --  12.7*  HCT 37.2* 37.5*  --  37.5*  PLT 148* 166  --  168  HEPARINUNFRC 0.51 0.77* 0.34 0.60  CREATININE  --   --   --  2.09*     Estimated Creatinine Clearance: 39.1 mL/min (A) (by C-G formula based on SCr of 2.09 mg/dL (H)).   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 Unable to take PO consistently, last dose reported on 11/09. Pharmacy consulted to manage heparin while Xarelto is on hold.  Heparin level therapeutic at 0.34 after reduction to 1100 units/hr.  CBC stable, no further issues with heparin infusion or signs of bleeding reported.  11/21 AM update HL 0.60- therapeutic No signs of bleeding No issues with infusion  Goal of Therapy:  Heparin level 0.3-0.7 units/ml Monitor platelets by anticoagulation protocol: Yes   Plan: Continue heparin infusion to 1100 units/hr Continue to monitor daily heparin levels while on therapy Continue to monitor H&H and platelets  Thank you for allowing pharmacy to be a part of this patient's care.  Vaughan Basta BS, PharmD, BCPS Clinical Pharmacist 12/16/2021 6:41 AM  Contact: [phone] after 3 PM  "Be  curious, not judgmental..." -Jamal Maes

## 2021-12-16 NOTE — Progress Notes (Signed)
Initial Nutrition Assessment  DOCUMENTATION CODES:  Severe malnutrition in context of acute illness/injury  INTERVENTION:  -Recommend bowel regimen  -Complete 24hrs calorie count. -Once cortrak is placed and confirmed, recommend providing '100mg'$  thiamine x5-7 days, '1mg'$  folic acid [id number], and daily MVI. -Check lytes q 12hrs and replete prn -Initiate Jevity 1.5 @ 24m/hr. As tolerated, increase by 148mq 24hrs to goal rate of 6051mr x 24hrs (1440m77mtal volume) to provide 2160kcal, 92g protein and 1094mL61me water. -Once IVF is discontinued, recommend FWF of 160mL 18mrs to provide an additional 960mL f69mwater for daily free water total of 2054mL.  71mITION DIAGNOSIS:  Severe Malnutrition related to acute illness as evidenced by mild fat depletion, moderate muscle depletion, energy intake < or equal to 50% for > or equal to 5 days.  GOAL:  Patient will meet greater than or equal to 90% of their needs  MONITOR:  PO intake, TF tolerance  REASON FOR ASSESSMENT:  Consult Enteral/tube feeding initiation and management  ASSESSMENT:  Pt is a 71yo M w30yoPMH of antiphospholipid antibody, pulmonary embolism, hypercholesteremia and recent COVID-19 infection approximately 3 weeks PTA who presents with progressive confusion over the past few weeks.  Visited pt at bedside with wife present. She reports pt with very minimal po intake x 12 days. Recorded intake <50% estimated needs for 12-14 days. In the last 2 weeks, pt had a 4.3% unintended weight loss. NFPE reveals mild fat loss and moderate muscle wasting. Pt meets ASPEN criteria for severe protein calorie malnutrition r/t acute metabolic encephalopathy.   Wife tells RD that she would like NGT/cortrak placed for nutrition and meds. Pt has not had BM since 11/16, she's hoping to start bowel regimen via NGT. Discussed that nutrition will need to be initiated slowly due to very poor po intake, and severe malnutrition. Explained that tube  feeding will provide 100% estimated needs but pt can continue to eat po if desired. Calorie Count started today per MD order, until NGT can be placed. Plan for cortrak tomorrow, 11/22. Of note, per report wife/POA would like cortrak placed but there is hesitation from children. Wife denies pt having food allergies or intolerance. Just notes he doesn't do well with a lot of wheat or corn, but eats wheat toast every morning.  Once cortrak placed and confirmed. Recommend providing '100mg'$  thiamine x5-7 days, '1mg'$  folic acid [id number] MVI. Will initiate EN slowly due to risk of refeeding. Please check lytes q 12hrs while EN progresses. Recommend initiating Jevity 1.5 @ 20mL/hr.31mtolerated, increase by 10mL q 2427mto goal rate of 60mL/hr x 62ms (1440mL total 80mme) to provide 2160kcal, 92g protein and 1094mL free wa75m When IVF is discontinued, recommend FWF of 160mL q 4hrs t58movide an additional 960mL free wate80mr daily free water total of 2054mL.  Medicati51mreviewed and include: protonix, NS w/KCl  Labs reviewed: Na:147, BG:86-133, BUN:26, Cr:2.09, GFR:34   NUTRITION - FOCUSED PHYSICAL EXAM:  Flowsheet Row Most Recent Value  Orbital Region Mild depletion  Upper Arm Region Mild depletion  Thoracic and Lumbar Region Moderate depletion  Buccal Region Mild depletion  Temple Region Moderate depletion  Clavicle Bone Region Mild depletion  Clavicle and Acromion Bone Region Mild depletion  Scapular Bone Region Unable to assess  Dorsal Hand Moderate depletion  Patellar Region Moderate depletion  Anterior Thigh Region Moderate depletion  Posterior Calf Region Mild depletion  Hair Reviewed  Eyes Reviewed  Mouth Unable to assess  Skin Reviewed  Nails Reviewed       Diet Order:   Diet Order             DIET DYS 3 Room service appropriate? Yes; Fluid consistency: Thin  Diet effective now                   EDUCATION NEEDS:  Education needs have been addressed  (discussed NGT/cortrak and nutrition w/ wife)  Skin:  Skin Assessment: Reviewed RN Assessment  Last BM:  11/16  Height:  Ht Readings from Last 1 Encounters:  12/08/21 '6\' 5"'$  (1.956 m)   Weight:  Wt Readings from Last 1 Encounters:  12/10/21 81.8 kg    BMI:  Body mass index is 21.38 kg/m.  Estimated Nutritional Needs:  Kcal:  4854-6270JJKK Protein:  85-100g Fluid:  2045-2427m  KCandise Bowens MS, RD, LDN, CNSC See AMiON for contact information

## 2021-12-16 NOTE — Progress Notes (Addendum)
PROGRESS NOTE    Spencer Ross  DXI:[id number] DOB: 08/11/1950 DOA: 12/05/2021 PCP: Lujean Amel, MD    Brief Narrative:  71 year old male with a history of antiphospholipid antibody, pulmonary embolism in the past on chronic anticoagulation, recently had COVID-19 infection approximately 3 weeks PTA presented with progressive confusion over the past few weeks. MRI showed cortical restricted diffusion primarily in the right cerebral hemisphere, bilateral caudate heads and anterior putamina.  Neurology following.  He underwent lumbar puncture with several CSF studies in process. Infectious Meningitis/encephalitis panel noted to be negative.  He was started on high-dose IV steroids.  Overall mental status appears to be waxing and waning.  Nontunneled right IJ CVC placed by CCM 11/14.  Started alternate day plasma pheresis 11/15 through 11/24.  Ongoing waxing and waning mental status.  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  Repeat MRI brain with and without contrast on 11/15 without changes compared to prior. -Neurology following and their input much appreciated.  Neurology team to discuss case with [place] colleague (Dr. Pearletha Forge) 12/15/2021 continue plasma exchange for now. -Continuous EEG did not show any epileptiform discharges initially (12th) - noted to be abnormal during the day 16th with questionable seizure overnight - long term EEG ongoing -Depakote dose increased 17th (previously on low dose for mood) -Lumbar puncture performed on 11/11.  Several studies are still currently in  process -Completed 5-day course of high-dose Solu-Medrol 1 g daily on 11/15.  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  As per Dr. Rory Percy, many of these results are being directly coordinated with neurology team. -Repeat long term EEG pending 17th given overnight 'seizure-like' activity  Acute metabolic encephalopathy Dysphagia Moderate caloric malnutrition -Secondary to above -Medications transitioned to IV given inconsistent PO intake -Continue to follow with bedside swallow/speech as indicated -Dietary to follow given prolonged NPO status -  -Family initially agreed to NG tube 11/20 but then wanted to hold off later in the evening to try to advance PO as tolerated - if PO continues to be poor cortrak placement 11/22 may be reasonable  Antiphospholipid antibody syndrome History of PE -On chronic anticoagulation -Xarelto was on hold for lumbar puncture -Currently on heparin infusion. Restart Xarelto in 24-48h once definitive oral route established and no further procedures are indicated - remains intermittently NPO   Anxiety -Continue on home dose of BuSpar  Asymptomatic sinus bradycardia: Telemetry shows SB in the 40s-50s. TSH normal.   No rate control medications. Clinically stable  Hypokalemia, resolved: Follow repeat labs  Recent COVID-19 infection -First diagnosed approximately 3 weeks ago -He  has been vaccinated -He was taking steroids as well as ivermectin prior to admission -Currently not hypoxic and hemodynamically stable.  DVT  prophylaxis: Heparin infusion Code Status: Full code Family Communication: Spouse and son updated at bedside  Disposition Plan: Status is: Inpatient Remains inpatient appropriate because: Continued work-up and treatment including plasma exchange, of encephalopathy  Consultants:  Neurology PCCM for CVC placement  Procedures:  Lumbar puncture 11/11 RIJ CVC placement 11/14  Antimicrobials:  All discontinued   Subjective: Questionable seizure noted by staff overnight, patient remains confused, poorly interactive following commands intermittently.  Nonverbal for myself and family at bedside.  Objective: Vitals:   12/15/21 2311 12/16/21 0048 12/16/21 0338 12/16/21 0600  BP: (!) 190/73 (!) 141/77 (!) 208/76 (!) 142/79  Pulse: 64  61   Resp: 16     Temp: 98.6 F (37 C)  98.5 F (36.9 C)   TempSrc: Oral  Oral   SpO2: 100%     Weight:      Height:        Intake/Output Summary (Last 24 hours) at 12/16/2021 0741 Last data filed at 12/16/2021 0530 Gross per 24 hour  Intake --  Output 1200 ml  Net -1200 ml    Filed Weights   12/06/21 2217 12/08/21 0214 12/10/21 0901  Weight: 81.3 kg 81.8 kg 81.8 kg    Examination:  General exam: Young male, moderately built and nourished lying with restraints as described above, in bed, startles easily when nursing attempts to even check vital signs or MD tries to examine. Respiratory system: Clear to auscultation.  No increased work of breathing. Cardiovascular system: S1 and S2 heard, regular bradycardia.  No murmurs, JVD or pedal edema.  Telemetry personally reviewed: Overnight SB mostly in the 50s, SR in the 70s this morning.  No pauses or heart blocks noted. Gastrointestinal system: Abdomen is nondistended, soft and nontender. No organomegaly or masses felt. Normal bowel sounds heard. Central  nervous system: Alert and oriented to self and spouse at bedside.  No cranial nerve deficits appreciated. Extremities: Symmetric 5 x 5 power. Skin: No rashes, lesions or ulcers Psychiatry: Does not appear anxious or agitated.  Rest cannot be assessed due to AMS at this time.    Data Reviewed: I have personally reviewed following labs and imaging studies  CBC: Recent Labs  Lab 12/12/21 0310 12/13/21 0355 12/14/21 0415 12/15/21 0430 12/16/21 0422  WBC 9.0 9.8 9.5 12.1* 19.3*  HGB 13.8 12.9* 12.7* 12.5* 12.7*  HCT 40.5 38.4* 37.2* 37.5* 37.5*  MCV 89.2 89.9 89.9 91.7 92.1  PLT 167 141* 148* 166 542    Basic Metabolic Panel: Recent Labs  Lab 12/11/21 0530 12/12/21 0310 12/12/21 0830 12/13/21 0355 12/16/21 0422  NA 138 144 145 145 147*  K 3.3* 3.8 4.0 3.6 4.3  CL 108 111 112* 114* 117*  CO2 '22 22 24 '$ 21* 20*  GLUCOSE 94 96 106* 101* 125*  BUN '18 17 19 17 '$ 26*  CREATININE 0.83 0.99 1.20 1.30* 2.09*  CALCIUM 8.6* 8.9 8.9 8.8* 8.9  MG 2.3  --   --   --   --   PHOS  --  4.6  --   --   --     GFR: Estimated Creatinine Clearance: 39.1 mL/min (A) (by C-G formula based on SCr of 2.09 mg/dL (H)). Liver Function Tests: Recent Labs  Lab 12/12/21 0310  ALBUMIN 3.6    No results for input(s): "LIPASE", "AMYLASE" in the last 168 hours. Recent Labs  Lab 12/12/21 1636  AMMONIA 24     HbA1C: No results for input(s): "HGBA1C" in the last 72 hours.  CBG:  Recent Labs  Lab 12/15/21 0603 12/15/21 1205 12/15/21 1632 12/15/21 2106 12/16/21 0635  GLUCAP 111* 86 107* 104* 133*      Recent Results (from the past 240 hour(s))  CSF culture w Gram Stain     Status: None   Collection Time: 12/06/21  9:35 PM   Specimen: CSF; Cerebrospinal Fluid  Result Value Ref Range Status   Specimen Description CSF  Final   Special Requests TUBE 2 POSSIBLE CJD  Final   Gram Stain   Final    CYTOSPIN SMEAR WBC PRESENT, PREDOMINANTLY MONONUCLEAR NO ORGANISMS SEEN    Culture   Final     NO GROWTH 3 DAYS Performed at [facility], [place] [address], [place], [place] [address]    Report Status 12/10/2021 FINAL  Final  Fungus Culture With Stain     Status: None (Preliminary result)   Collection Time: 12/06/21  9:35 PM   Specimen: Lumbar Puncture; Cerebrospinal Fluid  Result Value Ref Range Status   Fungus Stain Final report  Final    Comment: (NOTE) Performed At: [facility] [address], [place] [id number] Rush Farmer MD WL:[id number]    Fungus (Mycology) Culture PENDING  Incomplete   Fungal Source CSF  Final    Comment: Performed at [facility], [place] [address], [place], [place] [address]  Culture, fungus without smear     Status: None (Preliminary result)   Collection Time: 12/06/21  9:35 PM   Specimen: CSF; Cerebrospinal Fluid  Result Value Ref Range Status   Specimen Description CSF  Final   Special Requests POSSIBLE CJD  Final   Culture   Final    NO FUNGUS ISOLATED AFTER 9 DAYS Performed at [facility], [place] [address] [place], [place] [address]    Report Status PENDING  Incomplete  Fungus Culture Result     Status: None   Collection Time: 12/06/21  9:35 PM  Result Value Ref Range Status   Result 1 Comment  Final    Comment: (NOTE) KOH/Calcofluor preparation:  no fungus observed. Performed At: [facility] [place], [place] [id number] Rush Farmer MD OV:[id number]   MRSA Next Gen by PCR, Nasal     Status: None   Collection Time: 12/06/21 11:25 PM   Specimen: Nasal Mucosa; Nasal Swab  Result Value Ref Range Status   MRSA by PCR Next Gen NOT DETECTED NOT DETECTED Final    Comment: (NOTE) The GeneXpert MRSA Assay (FDA approved for NASAL specimens only), is one component of a comprehensive MRSA colonization surveillance program. It is not intended to diagnose MRSA infection nor to guide or monitor treatment for MRSA infections. Test performance is not FDA approved in patients less than  59 years old. Performed at [facility], [place] [address], [place], [place] [address]     Radiology Studies: No results found.  Scheduled Meds:  Chlorhexidine Gluconate Cloth  6 each Topical Daily   pantoprazole (PROTONIX) IV  40 mg Intravenous Q24H   sodium chloride flush  3 mL Intravenous Q12H   Continuous Infusions:  sodium chloride     0.9 % NaCl with KCl 40 mEq / L 75 mL/hr at 12/15/21 1952   heparin 1,100 Units/hr (12/15/21 2134)   valproate sodium 500 mg (12/15/21 2121)    LOS: 10 days   Time spent: 75mns  Sophie Quiles DO   To contact the attending provider between 7A-7P or the covering provider during after  hours 7P-7A, please log into the web site [email or web address] and access using universal  password for that web site. If you do not have the password, please call the hospital operator.

## 2021-12-17 ENCOUNTER — Inpatient Hospital Stay (HOSPITAL_COMMUNITY): Payer: Medicare PPO

## 2021-12-17 DIAGNOSIS — E870 Hyperosmolality and hypernatremia: Secondary | ICD-10-CM | POA: Diagnosis not present

## 2021-12-17 DIAGNOSIS — E43 Unspecified severe protein-calorie malnutrition: Secondary | ICD-10-CM | POA: Insufficient documentation

## 2021-12-17 DIAGNOSIS — R1312 Dysphagia, oropharyngeal phase: Secondary | ICD-10-CM

## 2021-12-17 DIAGNOSIS — G9340 Encephalopathy, unspecified: Secondary | ICD-10-CM | POA: Diagnosis not present

## 2021-12-17 DIAGNOSIS — N179 Acute kidney failure, unspecified: Secondary | ICD-10-CM | POA: Diagnosis not present

## 2021-12-17 DIAGNOSIS — G0490 Encephalitis and encephalomyelitis, unspecified: Secondary | ICD-10-CM | POA: Diagnosis not present

## 2021-12-17 LAB — URINALYSIS, ROUTINE W REFLEX MICROSCOPIC
Bilirubin Urine: NEGATIVE
Glucose, UA: NEGATIVE mg/dL
Ketones, ur: 15 mg/dL — AB
Nitrite: POSITIVE — AB
Protein, ur: 30 mg/dL — AB
Specific Gravity, Urine: >1.03 — ABNORMAL HIGH (ref 1.005–1.030)
pH: 5.5 (ref 5.0–8.0)

## 2021-12-17 LAB — CBC
HCT: 37.9 % — ABNORMAL LOW (ref 39.0–52.0)
Hemoglobin: 12 g/dL — ABNORMAL LOW (ref 13.0–17.0)
MCH: 30 pg (ref 26.0–34.0)
MCHC: 31.7 g/dL (ref 30.0–36.0)
MCV: 94.8 fL (ref 80.0–100.0)
Platelets: 174 10*3/uL (ref 150–400)
RBC: 4 MIL/uL — ABNORMAL LOW (ref 4.22–5.81)
RDW: 15 % (ref 11.5–15.5)
WBC: 17.4 10*3/uL — ABNORMAL HIGH (ref 4.0–10.5)
nRBC: 0 % (ref 0.0–0.2)

## 2021-12-17 LAB — GLUCOSE, CAPILLARY
Glucose-Capillary: 89 mg/dL (ref 70–99)
Glucose-Capillary: 103 mg/dL — ABNORMAL HIGH (ref 70–99)
Glucose-Capillary: 112 mg/dL — ABNORMAL HIGH (ref 70–99)

## 2021-12-17 LAB — BASIC METABOLIC PANEL
Anion gap: 13 (ref 5–15)
BUN: 31 mg/dL — ABNORMAL HIGH (ref 8–23)
CO2: 17 mmol/L — ABNORMAL LOW (ref 22–32)
Calcium: 8.8 mg/dL — ABNORMAL LOW (ref 8.9–10.3)
Chloride: 120 mmol/L — ABNORMAL HIGH (ref 98–111)
Creatinine, Ser: 1.37 mg/dL — ABNORMAL HIGH (ref 0.61–1.24)
GFR, Estimated: 56 mL/min — ABNORMAL LOW (ref >60)
Glucose, Bld: 103 mg/dL — ABNORMAL HIGH (ref 70–99)
Potassium: 3.8 mmol/L (ref 3.5–5.1)
Sodium: 150 mmol/L — ABNORMAL HIGH (ref 135–145)

## 2021-12-17 LAB — URINALYSIS, MICROSCOPIC (REFLEX): Squamous Epithelial / HPF: NONE SEEN (ref 0–5)

## 2021-12-17 LAB — HEPARIN LEVEL (UNFRACTIONATED): Heparin Unfractionated: 0.63 IU/mL (ref 0.30–0.70)

## 2021-12-17 LAB — MISC LABCORP TEST (SEND OUT)
Labcorp test code: 2048
Source (LabCorp): 0.2

## 2021-12-17 MED ORDER — ADULT MULTIVITAMIN W/MINERALS CH
1.0000 | ORAL_TABLET | Freq: Every day | ORAL | Status: DC
  Administered 2021-12-17 – 2021-12-20 (×4): 1 via ORAL
  Filled 2021-12-17 (×5): qty 1

## 2021-12-17 MED ORDER — THIAMINE MONONITRATE 100 MG PO TABS: 100.0000 mg | ORAL_TABLET | Freq: Every day | ORAL | Status: DC

## 2021-12-17 MED ORDER — THIAMINE MONONITRATE 100 MG PO TABS
100.0000 mg | ORAL_TABLET | Freq: Every day | ORAL | Status: DC
  Administered 2021-12-17 – 2021-12-20 (×4): 100 mg via ORAL
  Filled 2021-12-17 (×5): qty 1

## 2021-12-17 MED ORDER — SODIUM CHLORIDE 0.9 % IV SOLN
Freq: Once | INTRAVENOUS | Status: AC
  Filled 2021-12-17: qty 200

## 2021-12-17 MED ORDER — ACD FORMULA A 0.73-2.45-2.2 GM/100ML VI SOLN
1000.0000 mL | Status: DC
  Filled 2021-12-17: qty 1000

## 2021-12-17 MED ORDER — SODIUM CHLORIDE 0.9 % IV SOLN
INTRAVENOUS | Status: DC
  Filled 2021-12-17 (×2): qty 200

## 2021-12-17 MED ORDER — SODIUM CHLORIDE 0.9 % IV SOLN
INTRAVENOUS | Status: DC
  Filled 2021-12-17 (×3): qty 200

## 2021-12-17 MED ORDER — FOLIC ACID 1 MG PO TABS
1.0000 mg | ORAL_TABLET | Freq: Every day | ORAL | Status: DC
  Administered 2021-12-17 – 2021-12-20 (×4): 1 mg via ORAL
  Filled 2021-12-17 (×5): qty 1

## 2021-12-17 MED ORDER — ACETAMINOPHEN 325 MG PO TABS: 650.0000 mg | ORAL_TABLET | ORAL | Status: DC | PRN

## 2021-12-17 MED ORDER — JEVITY 1.5 CAL/FIBER PO LIQD
1000.0000 mL | ORAL | Status: DC
  Administered 2021-12-17 – 2021-12-25 (×8): 1000 mL
  Filled 2021-12-17 (×5): qty 1000
  Filled 2021-12-17 (×2): qty 1185
  Filled 2021-12-17 (×9): qty 1000
  Filled 2021-12-17: qty 1185
  Filled 2021-12-17: qty 1000

## 2021-12-17 MED ORDER — CALCIUM CARBONATE ANTACID 500 MG PO CHEW: 2.0000 | CHEWABLE_TABLET | ORAL | Status: AC

## 2021-12-17 MED ORDER — DEXTROSE 5 % IV SOLN: INTRAVENOUS | Status: DC

## 2021-12-17 MED ORDER — ACD FORMULA A 0.73-2.45-2.2 GM/100ML VI SOLN
Status: AC
  Filled 2021-12-17: qty 1000

## 2021-12-17 MED ORDER — SODIUM CHLORIDE 0.9 % IV SOLN
INTRAVENOUS | Status: AC
  Filled 2021-12-17 (×3): qty 200

## 2021-12-17 MED ORDER — HEPARIN SODIUM (PORCINE) 1000 UNIT/ML IJ SOLN
1000.0000 [IU] | Freq: Once | INTRAMUSCULAR | Status: AC
  Administered 2021-12-17: 1000 [IU]
  Filled 2021-12-17: qty 1

## 2021-12-17 MED ORDER — CALCIUM GLUCONATE-NACL 2-0.675 GM/100ML-% IV SOLN
2.0000 g | Freq: Once | INTRAVENOUS | Status: DC
  Filled 2021-12-17 (×2): qty 100

## 2021-12-17 MED ORDER — FOLIC ACID 1 MG PO TABS: 1.0000 mg | ORAL_TABLET | Freq: Every day | ORAL | Status: DC

## 2021-12-17 MED ORDER — HEPARIN SODIUM (PORCINE) 1000 UNIT/ML IJ SOLN
INTRAMUSCULAR | Status: AC
  Filled 2021-12-17: qty 3

## 2021-12-17 NOTE — Progress Notes (Signed)
Patient completed Apheresis treatment 4 of 5 without difficulties noted. VSS post treatment BP -141/77,HR -61, R-20,T-98.8(Axillary), O2-98%. Patient next treatment is scheduled for Friday 11/24. Report given to floor RN Collene Mares.

## 2021-12-17 NOTE — Progress Notes (Signed)
Spoke w RN who will call [phone] to let EEG team know when patient is back from plasma exchange per EEG order.

## 2021-12-17 NOTE — Progress Notes (Signed)
LTM EEG connected with Non MRI leads. [facility] monitoring and test button was tested.

## 2021-12-17 NOTE — Progress Notes (Signed)
   12/17/21 1252  Neurological  Level of Consciousness Responds to Voice  Respiratory  Respiratory Pattern Regular;Unlabored  Chest Assessment Chest expansion symmetrical  R Upper  Breath Sounds Clear;Diminished  L Upper Breath Sounds Clear;Diminished  R Lower Breath Sounds Clear;Diminished  L Lower Breath Sounds Clear;Diminished  Cardiac  Pulse Regular  Cardiac Rhythm SB;NSR   Received patient in bed to unit.  Alert and oriented.  Informed consent signed and in chart.   Treatment initiated: [place] Treatment completed: 1252p  Patient tolerated well.  Transported back to the room  Alert, without acute distress.  Hand-off given to patient's nurse.   Access used: Yes Access issues: No  Total UF removed: 3547 Medication(s) given: Albumin Post HD VS: 98.8 (Axillary), 141/77, 61,20,96 RA Post HD weight: [facility]

## 2021-12-17 NOTE — Procedures (Signed)
Cortrak  Tube Type:  Cortrak - 43 inches Tube Location:  Left nare Secured by: Bridle Technique Used to Measure Tube Placement:  Marking at nare/corner of mouth Cortrak Secured At:  70 cm   Cortrak Tube Team Note:  Consult received to place a Cortrak feeding tube.   X-ray is required, abdominal x-ray has been ordered by the Cortrak team. Please confirm tube placement before using the Cortrak tube.   If the tube becomes dislodged please keep the tube and contact the Cortrak team at [email or web address] for replacement.  If after hours and replacement cannot be delayed, place a NG tube and confirm placement with an abdominal x-ray.    Spencer Poehlman MS, RD, LDN Please refer to AMION for RD and/or RD on-call/weekend/after hours pager   

## 2021-12-17 NOTE — Progress Notes (Signed)
ANTICOAGULATION CONSULT NOTE- follow-up  Pharmacy Consult for Heparin Indication:  APLS with h/o PE  Allergies  Allergen Reactions   Diphenhydramine     Other reaction(s): prostate swelling, increase urination   Penicillins Other (See Comments)    "childhood reaction from mother"   Trazodone Hcl     Other reaction(s): weak stream    Patient Measurements: Height: '6\' 5"'$  (195.6 cm) Weight: 81.8 kg (180 lb 5.4 oz) IBW/kg (Calculated) : 89.1  Heparin Dosing Weight: 89 kg  Vital Signs: Temp: 98.9 F (37.2 C) (11/22 0726) Temp Source: Oral (11/22 0726) BP: 156/75 (11/22 0726) Pulse Rate: 62 (11/22 0726)  Labs: Recent Labs    12/15/21 0430 12/15/21 1544 12/16/21 0422 12/17/21 0704  HGB 12.5*  --  12.7* 12.0*  HCT 37.5*  --  37.5* 37.9*  PLT 166  --  168 174  HEPARINUNFRC 0.77* 0.34 0.60 0.63  CREATININE  --   --  2.09* 1.37*     Estimated Creatinine Clearance: 59.7 mL/min (A) (by C-G formula based on SCr of 1.37 mg/dL (H)).   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 Unable to take PO consistently, last dose reported on 11/09. Pharmacy consulted to manage heparin while Xarelto is on hold.  Heparin level therapeutic at 0.34 after reduction to 1100 units/hr.  CBC stable, no further issues with heparin infusion or signs of bleeding reported.  11/22 AM update HL 0.63- therapeutic Hgb stable in the 12's No signs of bleeding No issues with infusion  Goal of Therapy:  Heparin level 0.3-0.7 units/ml Monitor platelets by anticoagulation protocol: Yes   Plan: Continue heparin infusion to 1100 units/hr Continue to monitor daily heparin levels while on therapy Continue to monitor H&H and platelets  Thank you for allowing pharmacy to be a part of this patient's care.  Vaughan Basta BS, PharmD, BCPS Clinical Pharmacist 12/17/2021 9:31 AM  Contact:  [phone] after 3 PM  "Be curious, not judgmental..." -Jamal Maes

## 2021-12-17 NOTE — Progress Notes (Signed)
Back from hemodialysis  by bed awake .Son at bedside.

## 2021-12-17 NOTE — Progress Notes (Signed)
   12/17/21 1252  Neurological  Level of Consciousness Responds to Voice  Respiratory  Respiratory Pattern Regular;Unlabored  Chest Assessment Chest expansion symmetrical  R Upper  Breath Sounds Clear;Diminished  L Upper Breath Sounds Clear;Diminished  R Lower Breath Sounds Clear;Diminished  L Lower Breath Sounds Clear;Diminished  Cardiac  Pulse Regular  Cardiac Rhythm SB;NSR

## 2021-12-17 NOTE — Progress Notes (Signed)
Subjective: Patient is mute  Exam: Vitals:   12/17/21 0307 12/17/21 0726  BP: (!) 193/81 (!) 156/75  Pulse: 66 62  Resp: 18 18  Temp: 97.6 F (36.4 C) 98.9 F (37.2 C)  SpO2: 98% 96%   Gen: In bed,  Neuro: MS: Patient is awake, he engages the examiner, but he does not follow commands or speak. CN: He fixates and tracks the examiner, looks cross midline, blinks to threat definitely from the right, I think from the left as well but he stops blinking to threat completely after the first couple of tries and therefore I am uncertain of the left.  Face is symmetric Motor: He moves all extremities spontaneously with no definite asymmetry, does not comply with formal strength testing. Sensory: He responds to mild stimulation bilaterally   Pertinent Labs: 1.2 -> Cr 2.09 today  Previous workup: 11/11 CSF cell count and differential, zero RBC, zero WBC Protein and glucose were not performed given concerns for contaminating machines in the setting of sending CJD testing IgG index-normal CSF VDRL-negative Infectious meningoencephalitis panel-negative  Anti-dsDNA normal Anti-Smith normal ANA normal SSA, SSB normal Thyroperoxidase antibodies-normal Mildly elevated thyroglobulin antibody RPR-nonreactive B1-113 B12-426 Ammonia-11 TSH-0.9  Serum paraneoplastic panel-pending CJD testing-pending   Impression: 71 year old male with progressive personality/behavior changes progressing to encephalopathy in the setting of recent COVID infection 3 weeks prior to presentation.  MRI shows restricted diffusion in a cortical ribbon pattern with focal right hemispheric slowing on EEG.  Given the rapid progression, autoimmune encephalitis has been given strong consideration and he has received course of IV Solu-Medrol and is currently scheduled for treatment #4 of plasma exchange.  Workup has been relatively unremarkable   Recommendations: 1) Repeat overnight EEG 2) [place] treatment 4/5  today 3) neurology will continue to follow  Roland Rack, MD [facility] [phone]  If 7pm- 7am, please page neurology on call as listed in [place].

## 2021-12-17 NOTE — Progress Notes (Signed)
Cortrak applied to left nasal  by RD with difficulty since pt is fighting. Bil mittens applied. Son at bedside claimed not to  apply restraint while family at bedside.

## 2021-12-17 NOTE — Progress Notes (Signed)
Nutrition Brief Note  Pt is s/p plasmapheresis x 4. Calorie count started yesterday, but no tickets or documentation available at this time. Recorded po intake from 11/13-11/18 inadequate. Wife reports pt with very minimal intake throughout length of stay (12 days). Cortrak placed this afternoon and confirmed with x ray. Pt is a very high risk of refeeding, recommend providing '100mg'$  thiamine x5-7 days, '1mg'$  folic acid Y6-3 days and daily MVI to minimize effects of refeeding. Slow advancement of EN required. Please check lytes q 12hrs and replete prn.  Of note, last BM 11/16, recommend bowel regimen via cortrak. Abdomen nontender and soft.   Labs reviewed: Na:150, BG:103, BUN:31, Cr:1.37  Estimated Nutritional Needs:  Kcal: [id number] Protein: 85-100g Fluid: 2045-2423m  Interventions: Recommend bowel regimen  Provide '100mg'$  thiamine x 5-7 days, '1mg'$  folic acid x 52-8NOMV and daily MVI.  Check lytes q 12hrs and replete prn  Tube feeding via cortrak: Initiate Jevity 1.5 @ 234mhr As tolerated, increase by 1057m 24hrs to goal rate of 34m52m x 24hrs (1440mL24mal volume) to provide 2160kcal, 92g protein and 1094mL 36m water. Once IVF discontinued, recommend FWF: 134mL q41ms to provide an additional 934mL fr34mater  Katie BuCandise Bowens, LDN, CNSC See AMiON for contact information

## 2021-12-17 NOTE — Progress Notes (Signed)
Transported to HD for plasmapheresis by bed awake.

## 2021-12-17 NOTE — Progress Notes (Signed)
Not on restraint  during shift changed.

## 2021-12-17 NOTE — Progress Notes (Signed)
[facility] PROGRESS NOTE   Spencer Ross IWP:[id number] DOB: [birth year] DOA: 12/05/2021  PCP: Lujean Amel, MD  Brief History/Interval Summary: 71 year old male with a history of antiphospholipid antibody, pulmonary embolism in the past on chronic anticoagulation, recently had COVID-19 infection approximately 3 weeks PTA presented with progressive confusion over the past few weeks. MRI showed cortical restricted diffusion primarily in the right cerebral hemisphere, bilateral caudate heads and anterior putamina. He underwent lumbar puncture with several CSF studies in process. Infectious Meningitis/encephalitis panel noted to be negative.  He was started on high-dose IV steroids.  Overall mental status appears to be waxing and waning.  Nontunneled right IJ CVC placed by CCM 11/14.  Started alternate day plasma pheresis 11/15 through 11/24.  Ongoing waxing and waning mental status.   Consultants: Neurology  Procedures: Lumbar puncture.  Central line placement.    Subjective/Interval History: Patient not very communicative.  Her eyes are open but does not answer any questions.  Noted to be distracted.    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  Repeat MRI brain with and without contrast on 11/15 without changes compared to prior. -Neurology following -Continuous EEG did not show any epileptiform discharges initially (12th) - noted to be abnormal during the day 16th with questionable seizure overnight - long term EEG ongoing -Depakote dose increased 17th (previously on low dose for mood) -Lumbar puncture performed on 11/11.  Several studies are still currently in process -Completed 5-day course of high-dose Solu-Medrol 1 g daily on 11/15.  -Empirically treated with acyclovir,  discontinued given negative CSF Noted to be on Depakote. Neurology discussed with providers at [facility] who recommended plasmapheresis. -Plasmapheresis ongoing per neurology Q48h through 12/19/21   Negative findings include: -infectious meningitis/encephalitis panel of CSF negative, cryptococcal antigen negative, HIV negative, TSH normal, ammonia normal, B12 426, RPR negative, ANA neg, Sjogren negative, anti Smith, DS DNA neg, CSF fungal culture no growth IgG CSF is normal range.  Zero oligoclonal bands were noted in CSF. Thyroglobulin antibody was 4.0 which is elevated. VDRL CSF is nonreactive Remains Pending: Beta Isoform (CJD) CSF Patient has not shown much improvement in mental status.  Neurology plans to discuss with providers at [facility] early next week.   Dysphagia Moderate caloric malnutrition -Secondary to above -Medications transitioned to IV given inconsistent PO intake -Continue to follow with bedside swallow/speech as indicated Oral intake remains poor due to altered mental status.  Discussed with wife this morning.  She agrees to feeding tube.  Cortrak has been ordered.   Antiphospholipid antibody syndrome History of PE -On chronic anticoagulation -Xarelto was on hold for lumbar puncture -Currently on heparin infusion. Restart Xarelto in 24-48h once definitive oral route established and no further procedures are indicated - remains intermittently NPO   Anxiety -Continue on home dose of BuSpar   Asymptomatic sinus bradycardia: Telemetry shows SB in the 40s-50s. TSH normal.   No rate control medications. Clinically stable  Hypernatremia Secondary to poor oral intake.  Hopefully will improve some tube feedings initiated.  Till then we will change IV fluids to D5.  Acute kidney injury Creatinine climbed up to 2.0 yesterday.  Noted to be 1.37 today.  Continue IV fluids.  Monitor urine output.   Hypokalemia, resolved: Follow repeat labs  Leukocytosis Likely due to  high-dose steroids the patient received recently.   Recent COVID-19 infection -First  diagnosed approximately 3 weeks ago -He has been vaccinated -He was taking steroids as well as ivermectin prior to admission -Currently not hypoxic and hemodynamically stable.   Severe protein calorie malnutrition Nutrition Problem: Severe Malnutrition Etiology: acute illness Signs/Symptoms: mild fat depletion, moderate muscle depletion, energy intake < or equal to 50% for > or equal to 5 days  DVT Prophylaxis: On IV heparin Code Status: Full code Family Communication: Discussed with patient's wife Disposition Plan: To be determined  Status is: Inpatient Remains inpatient appropriate because: Acute encephalopathy      Medications: Scheduled:  calcium carbonate  2 tablet Oral Q3H   Chlorhexidine Gluconate Cloth  6 each Topical Daily   heparin sodium (porcine)  1,000 Units Intracatheter Once   pantoprazole (PROTONIX) IV  40 mg Intravenous Q24H   sodium chloride flush  3 mL Intravenous Q12H   Continuous:  sodium chloride     albumin human 25 % 50 g in sodium chloride 0.9 %     albumin human 25 % 50 g in sodium chloride 0.9 %     calcium gluconate     citrate dextrose     citrate dextrose     dextrose     heparin 1,100 Units/hr (12/17/21 0800)   valproate sodium 500 mg (12/16/21 2149)   BTD:VVOHYW chloride, acetaminophen, atropine, citrate dextrose, haloperidol lactate, LORazepam, LORazepam, sodium chloride flush  Antibiotics: Anti-infectives (From admission, onward)    Start     Dose/Rate Route Frequency Ordered Stop   12/06/21 0700  acyclovir (ZOVIRAX) 800 mg in dextrose 5 % 250 mL IVPB  Status:  Discontinued        800 mg 266 mL/hr over 60 Minutes Intravenous Every 8 hours 12/06/21 0528 12/07/21 0527       Objective:  Vital Signs  Vitals:   12/17/21 0010 12/17/21 0307 12/17/21 0726 12/17/21 0800  BP: (!) 154/95 (!) 193/81 (!) 156/75   Pulse: 62 66 62   Resp: '16 18 18 18   '$ Temp: 97.6 F (36.4 C) 97.6 F (36.4 C) 98.9 F (37.2 C)   TempSrc:   Oral   SpO2: 94% 98% 96%   Weight:      Height:        Intake/Output Summary (Last 24 hours) at 12/17/2021 0952 Last data filed at 12/17/2021 0300 Gross per 24 hour  Intake --  Output 500 ml  Net -500 ml   Filed Weights   12/06/21 2217 12/08/21 0214 12/10/21 0901  Weight: 81.3 kg 81.8 kg 81.8 kg    General appearance: Awake alert.  In no distress.  Distracted Resp: Clear to auscultation bilaterally.  Normal effort Cardio: S1-S2 is normal regular.  No S3-S4.  No rubs murmurs or bruit GI: Abdomen is soft.  Nontender nondistended.  Bowel sounds are present normal.  No masses organomegaly Extremities: No edema.  Noted to be moving all his extremities Neurologic: Disoriented.  Does not answer questions.  No obvious focal deficits noted.   Lab Results:  Data Reviewed: I have personally reviewed following labs and reports of the imaging studies  CBC: Recent Labs  Lab 12/13/21 0355 12/14/21 0415 12/15/21 0430 12/16/21 0422 12/17/21 0704  WBC 9.8 9.5 12.1* 19.3* 17.4*  HGB 12.9* 12.7* 12.5* 12.7* 12.0*  HCT 38.4* 37.2* 37.5* 37.5* 37.9*  MCV 89.9 89.9 91.7 92.1 94.8  PLT 141* 148* 166 168 737    Basic Metabolic Panel: Recent Labs  Lab 12/11/21 0530 12/12/21 0310 12/12/21 0830 12/13/21 0355 12/16/21 0422 12/17/21  0704  NA 138 144 145 145 147* 150*  K 3.3* 3.8 4.0 3.6 4.3 3.8  CL 108 111 112* 114* 117* 120*  CO2 '22 22 24 '$ 21* 20* 17*  GLUCOSE 94 96 106* 101* 125* 103*  BUN '18 17 19 17 '$ 26* 31*  CREATININE 0.83 0.99 1.20 1.30* 2.09* 1.37*  CALCIUM 8.6* 8.9 8.9 8.8* 8.9 8.8*  MG 2.3  --   --   --   --   --   PHOS  --  4.6  --   --   --   --     GFR: Estimated Creatinine Clearance: 59.7 mL/min (A) (by C-G formula based on SCr of 1.37 mg/dL (H)).  Liver Function Tests: Recent Labs  Lab 12/12/21 0310  ALBUMIN 3.6    Recent Labs  Lab 12/12/21 1636  AMMONIA 24     CBG: Recent  Labs  Lab 12/16/21 0635 12/16/21 1122 12/16/21 1552 12/16/21 2225 12/17/21 0644  GLUCAP 133* 114* 100* 106* 89     Radiology Studies: No results found.     LOS: 11 days   Zali Kamaka [employer] on [email or web address]  12/17/2021, 9:52 AM

## 2021-12-17 NOTE — Progress Notes (Signed)
Md made aware about the urine with blood tinged. Claimed to continue with heparin gtt. Continue to monitor.

## 2021-12-18 ENCOUNTER — Inpatient Hospital Stay (HOSPITAL_COMMUNITY): Payer: Medicare PPO

## 2021-12-18 DIAGNOSIS — G9340 Encephalopathy, unspecified: Secondary | ICD-10-CM | POA: Diagnosis not present

## 2021-12-18 DIAGNOSIS — N179 Acute kidney failure, unspecified: Secondary | ICD-10-CM | POA: Diagnosis not present

## 2021-12-18 DIAGNOSIS — R1312 Dysphagia, oropharyngeal phase: Secondary | ICD-10-CM | POA: Diagnosis not present

## 2021-12-18 DIAGNOSIS — E870 Hyperosmolality and hypernatremia: Secondary | ICD-10-CM | POA: Diagnosis not present

## 2021-12-18 LAB — CBC
HCT: 38.7 % — ABNORMAL LOW (ref 39.0–52.0)
Hemoglobin: 12.9 g/dL — ABNORMAL LOW (ref 13.0–17.0)
MCH: 30.8 pg (ref 26.0–34.0)
MCHC: 33.3 g/dL (ref 30.0–36.0)
MCV: 92.4 fL (ref 80.0–100.0)
Platelets: 194 10*3/uL (ref 150–400)
RBC: 4.19 MIL/uL — ABNORMAL LOW (ref 4.22–5.81)
RDW: 15.1 % (ref 11.5–15.5)
WBC: 21.5 10*3/uL — ABNORMAL HIGH (ref 4.0–10.5)
nRBC: 0 % (ref 0.0–0.2)

## 2021-12-18 LAB — GLUCOSE, CAPILLARY
Glucose-Capillary: 160 mg/dL — ABNORMAL HIGH (ref 70–99)
Glucose-Capillary: 168 mg/dL — ABNORMAL HIGH (ref 70–99)
Glucose-Capillary: 146 mg/dL — ABNORMAL HIGH (ref 70–99)
Glucose-Capillary: 139 mg/dL — ABNORMAL HIGH (ref 70–99)
Glucose-Capillary: 150 mg/dL — ABNORMAL HIGH (ref 70–99)
Glucose-Capillary: 143 mg/dL — ABNORMAL HIGH (ref 70–99)

## 2021-12-18 LAB — VALPROIC ACID LEVEL: Valproic Acid Lvl: 74 ug/mL (ref 50.0–100.0)

## 2021-12-18 LAB — HEPARIN LEVEL (UNFRACTIONATED)
Heparin Unfractionated: 0.28 IU/mL — ABNORMAL LOW (ref 0.30–0.70)
Heparin Unfractionated: 0.43 IU/mL (ref 0.30–0.70)

## 2021-12-18 LAB — CK: Total CK: 153 U/L (ref 49–397)

## 2021-12-18 LAB — BASIC METABOLIC PANEL
Anion gap: 13 (ref 5–15)
BUN: 35 mg/dL — ABNORMAL HIGH (ref 8–23)
CO2: 17 mmol/L — ABNORMAL LOW (ref 22–32)
Calcium: 9.2 mg/dL (ref 8.9–10.3)
Chloride: 114 mmol/L — ABNORMAL HIGH (ref 98–111)
Creatinine, Ser: 2.76 mg/dL — ABNORMAL HIGH (ref 0.61–1.24)
GFR, Estimated: 24 mL/min — ABNORMAL LOW (ref >60)
Glucose, Bld: 166 mg/dL — ABNORMAL HIGH (ref 70–99)
Potassium: 3.4 mmol/L — ABNORMAL LOW (ref 3.5–5.1)
Sodium: 144 mmol/L (ref 135–145)

## 2021-12-18 MED ORDER — INSULIN ASPART 100 UNIT/ML IJ SOLN
0.0000 [IU] | INTRAMUSCULAR | Status: DC
  Administered 2021-12-18 – 2021-12-22 (×9): 1 [IU] via SUBCUTANEOUS

## 2021-12-18 MED ORDER — POLYETHYLENE GLYCOL 3350 17 G PO PACK
17.0000 g | PACK | Freq: Every day | ORAL | Status: DC
  Administered 2021-12-18 – 2021-12-25 (×4): 17 g
  Filled 2021-12-18 (×4): qty 1

## 2021-12-18 MED ORDER — SODIUM CHLORIDE 0.9 % IV SOLN
1.0000 g | INTRAVENOUS | Status: DC
  Administered 2021-12-18: 1 g via INTRAVENOUS
  Filled 2021-12-18: qty 10

## 2021-12-18 MED ORDER — SODIUM CHLORIDE 0.9 % IV BOLUS
500.0000 mL | Freq: Once | INTRAVENOUS | Status: AC
  Administered 2021-12-18: 500 mL via INTRAVENOUS

## 2021-12-18 NOTE — Progress Notes (Addendum)
[facility] PROGRESS NOTE   Spencer Ross SWH:[id number] DOB: [birth year] DOA: 12/05/2021  PCP: Lujean Amel, MD  Brief History/Interval Summary: 71 year old male with a history of antiphospholipid antibody, pulmonary embolism in the past on chronic anticoagulation, recently had COVID-19 infection approximately 3 weeks PTA presented with progressive confusion over the past few weeks. MRI showed cortical restricted diffusion primarily in the right cerebral hemisphere, bilateral caudate heads and anterior putamina. He underwent lumbar puncture with several CSF studies in process. Infectious Meningitis/encephalitis panel noted to be negative.  He was started on high-dose IV steroids.  Overall mental status appears to be waxing and waning.  Nontunneled right IJ CVC placed by CCM 11/14.  Started alternate day plasma pheresis 11/15 through 11/24.  Ongoing waxing and waning mental status.   Consultants: Neurology  Procedures: Lumbar puncture.  Central line placement.    Subjective/Interval History: Patient noted to be on continuous EEG.  Opens eyes when his name is called.  Noted to be on mittens.  Apparently he was trying to pull out his feeding tube yesterday.  His son is at the bedside.    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  Repeat MRI brain with and without contrast on 11/15 without changes compared to prior. -Neurology following -Continuous EEG did not show any epileptiform discharges initially (12th) - noted to be abnormal during the day 16th with questionable seizure overnight - long term EEG ongoing -Depakote dose increased 17th (previously on low dose for mood) -Lumbar puncture performed on 11/11.  Several studies are still currently in process -Completed 5-day course of  high-dose Solu-Medrol 1 g daily on 11/15.  -Empirically treated with acyclovir, discontinued given negative CSF Noted to be on Depakote. Neurology discussed with providers at [facility] who recommended plasmapheresis. -Plasmapheresis ongoing per neurology Q48h through 12/19/21   Negative findings include: -infectious meningitis/encephalitis panel of CSF negative, cryptococcal antigen negative, HIV negative, TSH normal, ammonia normal, B12 426, RPR negative, ANA neg, Sjogren negative, anti Smith, DS DNA neg, CSF fungal culture no growth IgG CSF is normal range.  Zero oligoclonal bands were noted in CSF. Thyroglobulin antibody was 4.0 which is elevated. VDRL CSF is nonreactive Remains Pending: Beta Isoform (CJD) CSF Patient has not shown much improvement in mental status.  Neurology plans to discuss with providers at [facility] early next week.   Dysphagia -Secondary to above Oral intake remained poor.  After discussions with wife at core track feeding tube was placed 12/17/21. Continue tube feedings.  Acute kidney injury/hematuria/possible UTI/acute urinary retention Creatinine noted to be 2.76 this morning.  It was 1.37 yesterday.  Patient is tender in the suprapubic area.  Bladder scan was done which showed significant urinary retention.  AKI possibly due to retention.  Renal ultrasound has been ordered. Patient also was noted to have mild hematuria yesterday.  UA is reviewed.  Cloudy urine is noted with large hemoglobin trace leukocytes positive nitrite.  Only 6-10 RBCs.  WBC 0-5.  Many bacteria. Will check a total CK level.  Send the urine for culture as well.  Start antibiotics for now. CK level is noted to be normal.  Penicillin allergy reviewed with the patient's wife.  She mentioned that this information may not be correct and that patient has tolerated amoxicillin previously.  Will continue with ceftriaxone for now. Discussed ultrasound results with patient's wife.  Discussed  ultrasound results  with Dr. Bess Harvest with urology.  The mass appears to be a clot as no such lesion was noted on recent CT scan.  Continue with Foley catheter with periodic irrigation.  May need CBI if Foley catheter becomes obstructed but will hold off for now.  Will hold heparin for 24 hours considering significant hematuria.   Antiphospholipid antibody syndrome History of PE -On chronic anticoagulation -Xarelto was on hold for lumbar puncture -Currently on heparin infusion. Restart Xarelto in 24-48h once definitive oral route established and no further procedures are indicated - remains intermittently NPO   Anxiety -Continue on home dose of BuSpar   Asymptomatic sinus bradycardia: TSH normal.  On no rate control medications. Clinically stable  Hypernatremia Secondary to poor oral intake.  Started on tube feedings yesterday.  Noted to be 144 this morning.   Hypokalemia Will replete.  Leukocytosis Likely due to high-dose steroids the patient received recently.  Noted to be trending upwards.  Abnormal UA noted.  Will start antibiotics.   Recent COVID-19 infection -First diagnosed approximately 3 weeks ago -He has been vaccinated -He was taking steroids as well as ivermectin prior to admission -Currently not hypoxic and hemodynamically stable.   Severe protein calorie malnutrition Nutrition Problem: Severe Malnutrition Etiology: acute illness Signs/Symptoms: mild fat depletion, moderate muscle depletion, energy intake < or equal to 50% for > or equal to 5 days  DVT Prophylaxis: On IV heparin Code Status: Full code Family Communication: Discussed with patient's son Disposition Plan: To be determined  Status is: Inpatient Remains inpatient appropriate because: Acute encephalopathy      Medications: Scheduled:  Chlorhexidine Gluconate Cloth  6 each Topical Daily   folic acid  1 mg Oral Daily   multivitamin with minerals  1 tablet Oral Daily   pantoprazole (PROTONIX) IV  40 mg Intravenous Q24H    sodium chloride flush  3 mL Intravenous Q12H   thiamine  100 mg Oral Daily   Continuous:  sodium chloride     calcium gluconate     citrate dextrose     dextrose 75 mL/hr at 12/18/21 0058   feeding supplement (JEVITY 1.5 CAL/FIBER) 1,000 mL (12/17/21 1730)   heparin 1,100 Units/hr (12/17/21 2127)   valproate sodium 500 mg (12/17/21 2130)   HAL:PFXTKW chloride, acetaminophen, atropine, haloperidol lactate, LORazepam, LORazepam, sodium chloride flush  Antibiotics: Anti-infectives (From admission, onward)    Start     Dose/Rate Route Frequency Ordered Stop   12/06/21 0700  acyclovir (ZOVIRAX) 800 mg in dextrose 5 % 250 mL IVPB  Status:  Discontinued        800 mg 266 mL/hr over 60 Minutes Intravenous Every 8 hours 12/06/21 0528 12/07/21 0527       Objective:  Vital Signs  Vitals:   12/18/21 0030 12/18/21 0046 12/18/21 0338 12/18/21 0727  BP: (!) 184/91 (!) 166/82 (!) 164/84 (!) 149/81  Pulse: 62 (!) 59 72 87  Resp: '18  18 17  '$ Temp: 99.2 F (37.3 C)  98.1 F (36.7 C) 99.2 F (37.3 C)  TempSrc: Oral   Oral  SpO2: 93%  99% 97%  Weight:      Height:        Intake/Output Summary (Last 24 hours) at 12/18/2021 0931 Last data filed at 12/18/2021 0449 Gross per 24 hour  Intake 1210 ml  Output 800 ml  Net 410 ml    Filed Weights   12/06/21 2217 12/08/21 0214 12/10/21 0901  Weight: 81.3 kg 81.8 kg 81.8  kg    General appearance: Eyes open when name is called.  Goes back to sleep.  Slightly agitated. Resp: Clear to auscultation bilaterally.  Normal effort Cardio: S1-S2 is normal regular.  No S3-S4.  No rubs murmurs or bruit GI: Abdomen is soft.  Tender in the suprapubic area.  No rebound rigidity or guarding. Extremities: No edema.  Noted to be moving all of his extremities Neurologic: Disoriented.  No focal neurological deficits.     Lab Results:  Data Reviewed: I have personally reviewed following labs and reports of the imaging studies  CBC: Recent Labs   Lab 12/14/21 0415 12/15/21 0430 12/16/21 0422 12/17/21 0704 12/18/21 0245  WBC 9.5 12.1* 19.3* 17.4* 21.5*  HGB 12.7* 12.5* 12.7* 12.0* 12.9*  HCT 37.2* 37.5* 37.5* 37.9* 38.7*  MCV 89.9 91.7 92.1 94.8 92.4  PLT 148* 166 168 174 194     Basic Metabolic Panel: Recent Labs  Lab 12/12/21 0310 12/12/21 0830 12/13/21 0355 12/16/21 0422 12/17/21 0704 12/18/21 0245  NA 144 145 145 147* 150* 144  K 3.8 4.0 3.6 4.3 3.8 3.4*  CL 111 112* 114* 117* 120* 114*  CO2 22 24 21* 20* 17* 17*  GLUCOSE 96 106* 101* 125* 103* 166*  BUN '17 19 17 '$ 26* 31* 35*  CREATININE 0.99 1.20 1.30* 2.09* 1.37* 2.76*  CALCIUM 8.9 8.9 8.8* 8.9 8.8* 9.2  PHOS 4.6  --   --   --   --   --      GFR: Estimated Creatinine Clearance: 29.6 mL/min (A) (by C-G formula based on SCr of 2.76 mg/dL (H)).  Liver Function Tests: Recent Labs  Lab 12/12/21 0310  ALBUMIN 3.6     Recent Labs  Lab 12/12/21 1636  AMMONIA 24      CBG: Recent Labs  Lab 12/17/21 0644 12/17/21 1641 12/17/21 2132 12/18/21 0340 12/18/21 0619  GLUCAP 89 103* 112* 160* 168*      Radiology Studies: Overnight EEG with video  Result Date: 12/18/2021 Lora Havens, MD     12/18/2021  9:23 AM Patient Name: Spencer Ross MRN: [id number] Epilepsy Attending: Lora Havens Referring Physician/Provider: Greta Doom, MD Duration: 12/17/2021 1648 to 12/18/2021 0920  Patient history: 71yo M with ams. EEG to evaluate for seizure  Level of alertness: Awake, asleep  AEDs during EEG study: VPA  Technical aspects: This EEG study was done with scalp electrodes positioned according to the 10-20 International system of electrode placement. Electrical activity was reviewed with band pass filter of 1-'70Hz'$ , sensitivity of 7 uV/mm, display speed of 4m/sec with a '60Hz'$  notched filter applied as appropriate. EEG data were recorded continuously and digitally stored.  Video monitoring was available and reviewed as appropriate.   Description: No clear posterior dominant rhythm was seen. Sleep was characterized by sleep spindles (12-'14hz'$ ), maximal fronto-central region. EEG also showed continuous generalized 3 to 7 Hz theta-delta slowing in left hemisphere and 2-'3Hz'$  delta slowing in right hemisphere. At times, lateralized periodic discharges were noted in right hemisphere, maximal right temporal region at 1-1.5 hz. Hyperventilation and photic stimulation were not performed.    ABNORMALITY - Lateralized periodic discharges , right hemisphere (LPD) - Continuous slow, generalized and lateralized right hemisphere  IMPRESSION: This study showed evidence of epileptogenicity and cortical dysfunction arising from right hemisphere likely secondary to underlying structural abnormality. Additionally there is moderate diffuse encephalopathy, nonspecific etiology. No definite  seizures were seen throughout the recording.  Priyanka OBarbra Sarks  DG Abd Portable  1V  Result Date: 12/17/2021 CLINICAL DATA:  Encounter for feeding tube placement EXAM: PORTABLE ABDOMEN - 1 VIEW COMPARISON:  CT 12/06/2021 FINDINGS: Feeding tube tip overlies the stomach. There is gaseous distention of bowel, predominantly colon, in a nonobstructive pattern. No acute osseous abnormality. IMPRESSION: Feeding tube tip overlies the stomach. Electronically Signed   By: Maurine Simmering M.D.   On: 12/17/2021 15:52       LOS: 12 days   [facility] Pager on [email or web address]  12/18/2021, 9:31 AM

## 2021-12-18 NOTE — Progress Notes (Signed)
LTM maint complete - no skin breakdown  Serviced [facility] monitored, Event button test confirmed by [facility].

## 2021-12-18 NOTE — Procedures (Addendum)
Patient Name: Spencer Ross  MRN: [id number]  Epilepsy Attending: Lora Havens  Referring Physician/Provider: Greta Doom, MD  Duration: 12/17/2021 1648 to 12/18/2021 1648   Patient history: 71yo M with ams. EEG to evaluate for seizure   Level of alertness: Awake, asleep   AEDs during EEG study: VPA   Technical aspects: This EEG study was done with scalp electrodes positioned according to the 10-20 International system of electrode placement. Electrical activity was reviewed with band pass filter of 1-'70Hz'$ , sensitivity of 7 uV/mm, display speed of 48m/sec with a '60Hz'$  notched filter applied as appropriate. EEG data were recorded continuously and digitally stored.  Video monitoring was available and reviewed as appropriate.   Description: No clear posterior dominant rhythm was seen. Sleep was characterized by sleep spindles (12-'14hz'$ ), maximal fronto-central region. EEG also showed continuous generalized 3 to 7 Hz theta-delta slowing in left hemisphere and 2-'3Hz'$  delta slowing in right hemisphere. At times, lateralized periodic discharges were noted in right hemisphere, maximal right temporal region at 1-1.5 hz. Hyperventilation and photic stimulation were not performed.      ABNORMALITY - Lateralized periodic discharges , right hemisphere (LPD) - Continuous slow, generalized and lateralized right hemisphere   IMPRESSION: This study showed evidence of epileptogenicity and cortical dysfunction arising from right hemisphere likely secondary to underlying structural abnormality. Additionally there is moderate diffuse encephalopathy, nonspecific etiology. No definite  seizures were seen throughout the recording.   Jacobb Alen OBarbra Sarks

## 2021-12-18 NOTE — Progress Notes (Addendum)
ANTICOAGULATION CONSULT NOTE - Follow Up Consult  Pharmacy Consult for Heparin Indication: APLS with h/o PE  Allergies  Allergen Reactions   Diphenhydramine     Other reaction(s): prostate swelling, increase urination   Penicillins Other (See Comments)    "childhood reaction from mother"   Trazodone Hcl     Other reaction(s): weak stream    Patient Measurements: Height: '6\' 5"'$  (195.6 cm) Weight: 81.8 kg (180 lb 5.4 oz) IBW/kg (Calculated) : 89.1 Heparin Dosing Weight: 81.8 kg  Vital Signs: Temp: 99.2 F (37.3 C) (11/23 0727) Temp Source: Oral (11/23 0727) BP: 149/81 (11/23 0727) Pulse Rate: 87 (11/23 0727)  Labs: Recent Labs    12/16/21 0422 12/17/21 0704 12/18/21 0245 12/18/21 0800  HGB 12.7* 12.0* 12.9*  --   HCT 37.5* 37.9* 38.7*  --   PLT 168 174 194  --   HEPARINUNFRC 0.60 0.63 0.28* 0.43  CREATININE 2.09* 1.37* 2.76*  --     Estimated Creatinine Clearance: 29.6 mL/min (A) (by C-G formula based on SCr of 2.76 mg/dL (H)).   Medications:  Scheduled:   Chlorhexidine Gluconate Cloth  6 each Topical Daily   folic acid  1 mg Oral Daily   multivitamin with minerals  1 tablet Oral Daily   pantoprazole (PROTONIX) IV  40 mg Intravenous Q24H   sodium chloride flush  3 mL Intravenous Q12H   thiamine  100 mg Oral Daily   Infusions:   sodium chloride     calcium gluconate     citrate dextrose     dextrose 75 mL/hr at 12/18/21 0058   feeding supplement (JEVITY 1.5 CAL/FIBER) 1,000 mL (12/17/21 1730)   heparin 1,100 Units/hr (12/17/21 2127)   valproate sodium 500 mg (12/17/21 2130)    Assessment: 71 yo M with medical history significant for APLS and history of VTE on Xarelto PTA who is admitted for AMS following recent COVID illness. Unable to take PO consistently, last Xarelto dose reported on 11/09. Pharmacy consulted to manage IV heparin while Xarelto is on hold.   Heparin level overnight was subtherapeutic at 0.28, on 1100 units/hr - no line issues or  signs/symptoms of bleeding noted per RN. Hgb 12.9, plt 194 - stable. Considering heparin level was previously therapeutic on current infusion rate, heparin level was re-ordered this morning and resulted at 0.43 - therapeutic. Per Day RN - heparin line was checked and flushed, no issues noted.   Goal of Therapy:  Heparin level 0.3-0.7 units/ml Monitor platelets by anticoagulation protocol: Yes   Plan:  Continue IV heparin at 1100 units/hr. Daily CBC, heparin level. Monitor for signs/symptoms of bleeding.  ADDENDUM:  Per RN - patient was noted to have hematuria. MD Maryland Pink) made aware and advised to hold heparin; RN stopped heparin infusion on 11/23 @ 1150, as noted in [facility]. Per discussion with Dr. Maryland Pink - will discontinue heparin order for now and MD to re-evaluate if able to resume heparin tomorrow morning (11/24 AM).   Vance Peper, PharmD PGY-2 Pharmacy Resident Phone [phone] 12/18/2021 9:24 AM   Please check AMION for all [place] phone numbers After 10:00 PM, call [place] [phone]

## 2021-12-18 NOTE — Progress Notes (Addendum)
Subjective: Patient is mute.  On further discussion with the family, I think they were actually symptoms prior to his COVID infection 4 weeks ago.  He began having severe insomnia with heightened anxiety about a month before the COVID infection, so somewhere around 8 weeks ago.  Exam: Vitals:   12/18/21 1523 12/18/21 1700  BP: (!) 97/52 114/65  Pulse:    Resp:    Temp:  98.8 F (37.1 C)  SpO2:  95%   Gen: In bed, he has an exaggerated startle.   Neuro: MS: Patient is lethargic but arousable, he engages the examiner, but he does not follow commands or speak. CN: He keeps eyes tightly closed making EOM testing difficult.  Face is symmetric Motor: He moves all extremities spontaneously with no definite asymmetry, does not comply with formal strength testing. Sensory: He responds to mild stimulation bilaterally   Pertinent Labs: 1.2 -> 2.09 -> 1.37 -> 2.76 today  Previous workup: 11/11 CSF cell count and differential, zero RBC, zero WBC Protein and glucose were not performed given concerns for contaminating machines in the setting of sending CJD testing IgG index-normal CSF VDRL-negative Infectious meningoencephalitis panel-negative  Anti-dsDNA normal Anti-Smith normal ANA normal SSA, SSB normal Thyroperoxidase antibodies-normal Mildly elevated thyroglobulin antibody RPR-nonreactive B1-113 B12-426 Ammonia-11 TSH-0.9  Serum paraneoplastic panel-pending CJD testing-pending   Impression: 71 year old male with progressive personality/behavior changes progressing to encephalopathy in the setting of recent COVID infection 3 weeks prior to presentation.  It sounds like he was having some neuropsychiatric symptoms of anxiety and insomnia for about a month prior to his COVID infection. MRI shows restricted diffusion in a cortical ribbon pattern with focal right hemispheric slowing on EEG.  Given the rapid progression, autoimmune encephalitis has been given strong consideration  and he has received course of IV Solu-Medrol and is currently scheduled for treatment #4 of plasma exchange.  The progression, though fast for CJD, I think it is possible to align with this given his neuropsychiatric symptoms prior to the COVID infection.  He does have periodic discharges, but no seizures, I will continue EEG again overnight tonight, but will discontinue it if no further seizures tomorrow.  He has not responded in any meaningful way to plasma exchange, we will continue to treat this as an autoimmune process at this time.   Recommendations: 1) Repeat overnight EEG 2) Plex treatment 5/5 tomorrow 3) neurology will continue to follow  Roland Rack, MD [facility] [phone]  If 7pm- 7am, please page neurology on call as listed in [place].

## 2021-12-18 NOTE — Plan of Care (Signed)
Pt is sleeping more the last 4-5 nights than he did when he first was admitted. If disturbed he becomes agitated but calms down quicker than before. Pt remains only oriented to self and doesn't follow any commands. Now receiving continuous tube fdg r/t now taking po.

## 2021-12-19 ENCOUNTER — Inpatient Hospital Stay (HOSPITAL_COMMUNITY): Payer: Medicare PPO

## 2021-12-19 DIAGNOSIS — G9340 Encephalopathy, unspecified: Secondary | ICD-10-CM | POA: Diagnosis not present

## 2021-12-19 DIAGNOSIS — J988 Other specified respiratory disorders: Secondary | ICD-10-CM | POA: Diagnosis not present

## 2021-12-19 DIAGNOSIS — R011 Cardiac murmur, unspecified: Secondary | ICD-10-CM

## 2021-12-19 LAB — BASIC METABOLIC PANEL
Anion gap: 3 — ABNORMAL LOW (ref 5–15)
Anion gap: 3 — ABNORMAL LOW (ref 5–15)
BUN: 14 mg/dL (ref 8–23)
BUN: 14 mg/dL (ref 8–23)
CO2: 20 mmol/L — ABNORMAL LOW (ref 22–32)
CO2: 21 mmol/L — ABNORMAL LOW (ref 22–32)
Calcium: 7.2 mg/dL — ABNORMAL LOW (ref 8.9–10.3)
Calcium: 7.8 mg/dL — ABNORMAL LOW (ref 8.9–10.3)
Chloride: 120 mmol/L — ABNORMAL HIGH (ref 98–111)
Chloride: 118 mmol/L — ABNORMAL HIGH (ref 98–111)
Creatinine, Ser: 0.71 mg/dL (ref 0.61–1.24)
Creatinine, Ser: 0.78 mg/dL (ref 0.61–1.24)
GFR, Estimated: >60 mL/min (ref >60)
GFR, Estimated: >60 mL/min (ref >60)
Glucose, Bld: 114 mg/dL — ABNORMAL HIGH (ref 70–99)
Glucose, Bld: 139 mg/dL — ABNORMAL HIGH (ref 70–99)
Potassium: 2.5 mmol/L — CL (ref 3.5–5.1)
Potassium: 3.2 mmol/L — ABNORMAL LOW (ref 3.5–5.1)
Sodium: 143 mmol/L (ref 135–145)
Sodium: 142 mmol/L (ref 135–145)

## 2021-12-19 LAB — CBC
HCT: 33.9 % — ABNORMAL LOW (ref 39.0–52.0)
Hemoglobin: 11.5 g/dL — ABNORMAL LOW (ref 13.0–17.0)
MCH: 30.7 pg (ref 26.0–34.0)
MCHC: 33.9 g/dL (ref 30.0–36.0)
MCV: 90.6 fL (ref 80.0–100.0)
Platelets: 173 10*3/uL (ref 150–400)
RBC: 3.74 MIL/uL — ABNORMAL LOW (ref 4.22–5.81)
RDW: 14.9 % (ref 11.5–15.5)
WBC: 22.1 10*3/uL — ABNORMAL HIGH (ref 4.0–10.5)
nRBC: 0 % (ref 0.0–0.2)

## 2021-12-19 LAB — GLUCOSE, CAPILLARY
Glucose-Capillary: 101 mg/dL — ABNORMAL HIGH (ref 70–99)
Glucose-Capillary: 110 mg/dL — ABNORMAL HIGH (ref 70–99)
Glucose-Capillary: 110 mg/dL — ABNORMAL HIGH (ref 70–99)
Glucose-Capillary: 123 mg/dL — ABNORMAL HIGH (ref 70–99)
Glucose-Capillary: 149 mg/dL — ABNORMAL HIGH (ref 70–99)
Glucose-Capillary: 88 mg/dL (ref 70–99)

## 2021-12-19 LAB — COMPREHENSIVE METABOLIC PANEL
ALT: 13 U/L (ref 0–44)
AST: 22 U/L (ref 15–41)
Albumin: 2.8 g/dL — ABNORMAL LOW (ref 3.5–5.0)
Alkaline Phosphatase: 20 U/L — ABNORMAL LOW (ref 38–126)
Anion gap: 10 (ref 5–15)
BUN: 17 mg/dL (ref 8–23)
CO2: 21 mmol/L — ABNORMAL LOW (ref 22–32)
Calcium: 8.2 mg/dL — ABNORMAL LOW (ref 8.9–10.3)
Chloride: 112 mmol/L — ABNORMAL HIGH (ref 98–111)
Creatinine, Ser: 1.09 mg/dL (ref 0.61–1.24)
GFR, Estimated: >60 mL/min (ref >60)
Glucose, Bld: 137 mg/dL — ABNORMAL HIGH (ref 70–99)
Potassium: 2.8 mmol/L — ABNORMAL LOW (ref 3.5–5.1)
Sodium: 143 mmol/L (ref 135–145)
Total Bilirubin: 0.8 mg/dL (ref 0.3–1.2)
Total Protein: 4.4 g/dL — ABNORMAL LOW (ref 6.5–8.1)

## 2021-12-19 LAB — BLOOD GAS, ARTERIAL
Acid-base deficit: 0.2 mmol/L (ref 0.0–2.0)
Bicarbonate: 21.8 mmol/L (ref 20.0–28.0)
Drawn by: 656981
O2 Saturation: 96.9 %
Patient temperature: 37.4
pCO2 arterial: 29 mmHg — ABNORMAL LOW (ref 32–48)
pH, Arterial: 7.49 — ABNORMAL HIGH (ref 7.35–7.45)
pO2, Arterial: 76 mmHg — ABNORMAL LOW (ref 83–108)

## 2021-12-19 LAB — ECHOCARDIOGRAM COMPLETE
AV Mean grad: 32 mmHg
AV Peak grad: 89.1 mmHg
Ao pk vel: 4.72 m/s
Area-P 1/2: 4.49 cm2
Calc EF: 61 %
Height: 77 in
MV M vel: 6.15 m/s
MV Peak grad: 151.3 mmHg
S' Lateral: 2.3 cm
Single Plane A2C EF: 59.2 %
Single Plane A4C EF: 62.8 %
Weight: 2885.38 oz

## 2021-12-19 LAB — CALCIUM: Calcium: 7.7 mg/dL — ABNORMAL LOW (ref 8.9–10.3)

## 2021-12-19 LAB — PROCALCITONIN: Procalcitonin: 0.21 ng/mL

## 2021-12-19 LAB — LACTIC ACID, PLASMA: Lactic Acid, Venous: 1.7 mmol/L (ref 0.5–1.9)

## 2021-12-19 MED ORDER — POTASSIUM CHLORIDE 20 MEQ PO PACK
20.0000 meq | PACK | ORAL | Status: AC
  Administered 2021-12-19 – 2021-12-20 (×2): 20 meq
  Filled 2021-12-19 (×2): qty 1

## 2021-12-19 MED ORDER — PIPERACILLIN-TAZOBACTAM 3.375 G IVPB
3.3750 g | Freq: Three times a day (TID) | INTRAVENOUS | Status: DC
  Administered 2021-12-19 – 2021-12-20 (×4): 3.375 g via INTRAVENOUS
  Filled 2021-12-19 (×4): qty 50

## 2021-12-19 MED ORDER — HEPARIN SODIUM (PORCINE) 1000 UNIT/ML IJ SOLN
1000.0000 [IU] | Freq: Once | INTRAMUSCULAR | Status: AC
  Administered 2021-12-19: 1000 [IU]
  Filled 2021-12-19: qty 1

## 2021-12-19 MED ORDER — ACD FORMULA A 0.73-2.45-2.2 GM/100ML VI SOLN
Status: AC
  Filled 2021-12-19: qty 1000

## 2021-12-19 MED ORDER — POTASSIUM CHLORIDE 20 MEQ PO PACK
40.0000 meq | PACK | ORAL | Status: AC
  Administered 2021-12-19 (×2): 40 meq via ORAL
  Filled 2021-12-19 (×2): qty 2

## 2021-12-19 MED ORDER — POTASSIUM CHLORIDE 10 MEQ/100ML IV SOLN
10.0000 meq | INTRAVENOUS | Status: AC
  Administered 2021-12-19 (×6): 10 meq via INTRAVENOUS
  Filled 2021-12-19 (×6): qty 100

## 2021-12-19 MED ORDER — SODIUM CHLORIDE 0.9 % IV SOLN
Freq: Once | INTRAVENOUS | Status: AC
  Filled 2021-12-19 (×4): qty 200

## 2021-12-19 MED ORDER — POTASSIUM CHLORIDE 10 MEQ/100ML IV SOLN: 10.0000 meq | INTRAVENOUS | Status: DC

## 2021-12-19 MED ORDER — MIDODRINE HCL 5 MG PO TABS
10.0000 mg | ORAL_TABLET | Freq: Three times a day (TID) | ORAL | Status: AC
  Administered 2021-12-19 (×2): 10 mg
  Filled 2021-12-19 (×2): qty 2

## 2021-12-19 MED ORDER — DIPHENHYDRAMINE HCL 25 MG PO CAPS: 25.0000 mg | ORAL_CAPSULE | Freq: Four times a day (QID) | ORAL | Status: DC | PRN

## 2021-12-19 MED ORDER — ORAL CARE MOUTH RINSE: 15.0000 mL | OROMUCOSAL | Status: DC | PRN

## 2021-12-19 MED ORDER — ORAL CARE MOUTH RINSE
15.0000 mL | OROMUCOSAL | Status: DC
  Administered 2021-12-19 – 2021-12-25 (×26): 15 mL via OROMUCOSAL

## 2021-12-19 MED ORDER — ACETAMINOPHEN 325 MG PO TABS: 650.0000 mg | ORAL_TABLET | ORAL | Status: DC | PRN

## 2021-12-19 MED ORDER — ACD FORMULA A 0.73-2.45-2.2 GM/100ML VI SOLN
1000.0000 mL | Status: DC
  Administered 2021-12-19: 1000 mL
  Filled 2021-12-19 (×2): qty 1000

## 2021-12-19 MED ORDER — POTASSIUM CHLORIDE 10 MEQ/100ML IV SOLN
10.0000 meq | INTRAVENOUS | Status: DC
  Administered 2021-12-19: 10 meq via INTRAVENOUS
  Filled 2021-12-19: qty 100

## 2021-12-19 MED ORDER — CALCIUM GLUCONATE-NACL 2-0.675 GM/100ML-% IV SOLN
2.0000 g | Freq: Once | INTRAVENOUS | Status: AC
  Administered 2021-12-19: 2000 mg via INTRAVENOUS
  Filled 2021-12-19 (×2): qty 100

## 2021-12-19 MED ORDER — PIPERACILLIN-TAZOBACTAM 3.375 G IVPB 30 MIN
3.3750 g | Freq: Once | INTRAVENOUS | Status: AC
  Administered 2021-12-19: 3.375 g via INTRAVENOUS
  Filled 2021-12-19: qty 50

## 2021-12-19 MED ORDER — POTASSIUM CHLORIDE 10 MEQ/100ML IV SOLN
10.0000 meq | INTRAVENOUS | Status: AC
  Administered 2021-12-19 (×4): 10 meq via INTRAVENOUS
  Filled 2021-12-19 (×4): qty 100

## 2021-12-19 MED ORDER — SODIUM CHLORIDE 0.9 % IV BOLUS
1000.0000 mL | INTRAVENOUS | Status: AC
  Administered 2021-12-19: 1000 mL via INTRAVENOUS

## 2021-12-19 MED ORDER — POTASSIUM CHLORIDE 10 MEQ/50ML IV SOLN
10.0000 meq | INTRAVENOUS | Status: AC
  Administered 2021-12-19 – 2021-12-20 (×4): 10 meq via INTRAVENOUS
  Filled 2021-12-19 (×4): qty 50

## 2021-12-19 MED ORDER — CALCIUM CARBONATE ANTACID 500 MG PO CHEW
2.0000 | CHEWABLE_TABLET | ORAL | Status: AC
  Administered 2021-12-19 (×2): 400 mg via ORAL
  Filled 2021-12-19 (×2): qty 2

## 2021-12-19 NOTE — Progress Notes (Signed)
Labs drawn from CVL and sent to lab. RT here and did ABG and pt didn't flinch during the procedure.

## 2021-12-19 NOTE — Progress Notes (Signed)
Patient last treatment 5 out of 5 completed without difficulties noted. MD, Leonel Ramsay request that we keep the patient Hemodialysis Catheter in, due to patient has no IV access. VSS Post treatment: BP- 124/64, T 98.8(Axillary), HR- 79, Resp-24, O2 95 on R/A. Report Given to floor RN Daisey Must.

## 2021-12-19 NOTE — Consult Note (Signed)
NAME:  Spencer Ross, MRN:  [id number], DOB:  1950-02-21, LOS: 76 ADMISSION DATE:  12/05/2021, CONSULTATION DATE:  11/24 REFERRING MD:  Dr. Maryland Pink, CHIEF COMPLAINT:  Encephalopathy   History of Present Illness:  Patient is encephalopathic and/or intubated. Therefore history has been obtained from chart review.  71 year old male with PMH as below, which is significant for PE, APLA on Xarelto, BPH, GERD, and HLD. Recent course significant for COVID-19 diagnosis 3 weeks prior to presentation. This was treated with ivermectin and steroids. He presented to [facility] ED 11/11 with complaints of progressive confusion and behavioral changes. He was admitted to the hospitalist service with what was thought to be COVID related encephalopathy. Neurology was consulted and presented a differential of autoimmune encephalitis, viral encephalitis, and Creutzfeldt-Jakob disease. Workup since that time has been rather benign although not all diagnostics had resulted. Experts at tertiary care center were consulted and suggested treatment with steroids and plasma exchange.  PLEX was started 11/15. Unfortunately he had not responded to treatment in any meaningful way. In the early AM hours of 11/24 he became less responsive. PCCM was consulted for worsening mental status and airway concerns.   Pertinent  Medical History   has a past medical history of Hypercholesteremia and Pulmonary embolism ([place]).   Significant Hospital Events: Including procedures, antibiotic start and stop dates in addition to other pertinent events   11/11 admit for encephalopathy with ddx autoimmune encephalitis, viral encephalitis, and Creutzfeldt-Jakob disease 11/14 Vasc cath placed 11/15 PLEX started 11/24 PCCM consult for poor mentation. Tx to ICU.   Interim History / Subjective:    Objective   Blood pressure (!) 85/59, pulse 88, temperature 100.1 F (37.8 C), temperature source Axillary, resp. rate (!) 28, height '6\' 5"'$  (1.956  m), weight 81.8 kg, SpO2 95 %.        Intake/Output Summary (Last 24 hours) at 12/19/2021 0349 Last data filed at 12/19/2021 0309 Gross per 24 hour  Intake 3911.93 ml  Output 3425 ml  Net 486.93 ml   Filed Weights   12/06/21 2217 12/08/21 0214 12/10/21 0901  Weight: 81.3 kg 81.8 kg 81.8 kg    Examination: General: Thin adult male in NAD HENT: [place]/AT, PERRL, no JVD Lungs: Clear bilateral breath soudns Cardiovascular: RRR, no MRG Abdomen: Soft, NT, ND Extremities: No acute deformity Neuro: Somnolent, will stir with tactile stimule, but awakens to painful stimuli and says "OW" GU: Foley  Resolved Hospital Problem list     Assessment & Plan:   Acute encephalopathy: etiology not well understood. Neurology has been consulting. Infectious and autoimmune workup negative. Creutzfeldt-Jakob disease tests pending. The patient has been treated with steroids and plasma exchange without appreciable improvement - More sedate tonight. Will transfer to ICU for close monitoring - Low threshold to intubate should he worsen - Neurology following - Serum paraneoplastic panel and CJD tests pending.  - Overnight EEG currently.  - PLEX 5/5 tomorrow.  - Depakote   Dysphagia: secondary to above - Cortrak, TF  AKI: improved Hypokalemia - Potassium supplementation - Trend BMP  Hypotension - midodrine  Urinary retention Hematuria - Keep foley - Irrigation PRN - Bladder mass identified on ultrasound felt to represent clot. D/w urology previously.  - May need CBI if significant bladder/foley obstruction.   Antiphospholipid antibody syndrome History of PE: 6 years ago - heparin infusion for AC.  - Xarelto on hold.   Leukocytosis: 2/2 steroids, UA abnormal as well - empiric zosyn continue for now  Severe  protein calorie nutrition - TF.     Best Practice (right click and "Reselect all SmartList Selections" daily)   Diet/type: tubefeeds DVT prophylaxis: systemic heparin GI  prophylaxis: PPI Lines: Central line Foley:  Yes, and it is still needed Code Status:  full code Last date of multidisciplinary goals of care discussion '[ ]'$   Labs   CBC: Recent Labs  Lab 12/15/21 0430 12/16/21 0422 12/17/21 0704 12/18/21 0245 12/19/21 0123  WBC 12.1* 19.3* 17.4* 21.5* 22.1*  HGB 12.5* 12.7* 12.0* 12.9* 11.5*  HCT 37.5* 37.5* 37.9* 38.7* 33.9*  MCV 91.7 92.1 94.8 92.4 90.6  PLT 166 168 174 194 161    Basic Metabolic Panel: Recent Labs  Lab 12/13/21 0355 12/16/21 0422 12/17/21 0704 12/18/21 0245 12/19/21 0123  NA 145 147* 150* 144 143  K 3.6 4.3 3.8 3.4* 2.8*  CL 114* 117* 120* 114* 112*  CO2 21* 20* 17* 17* 21*  GLUCOSE 101* 125* 103* 166* 137*  BUN 17 26* 31* 35* 17  CREATININE 1.30* 2.09* 1.37* 2.76* 1.09  CALCIUM 8.8* 8.9 8.8* 9.2 8.2*   GFR: Estimated Creatinine Clearance: 75 mL/min (by C-G formula based on SCr of 1.09 mg/dL). Recent Labs  Lab 12/16/21 0422 12/17/21 0704 12/18/21 0245 12/19/21 0120 12/19/21 0123  PROCALCITON  --   --   --   --  0.21  WBC 19.3* 17.4* 21.5*  --  22.1*  LATICACIDVEN  --   --   --  1.7  --     Liver Function Tests: Recent Labs  Lab 12/19/21 0123  AST 22  ALT 13  ALKPHOS 20*  BILITOT 0.8  PROT 4.4*  ALBUMIN 2.8*   No results for input(s): "LIPASE", "AMYLASE" in the last 168 hours. Recent Labs  Lab 12/12/21 1636  AMMONIA 24    ABG    Component Value Date/Time   PHART 7.49 (H) 12/19/2021 0125   PCO2ART 29 (L) 12/19/2021 0125   PO2ART 76 (L) 12/19/2021 0125   HCO3 21.8 12/19/2021 0125   ACIDBASEDEF 0.2 12/19/2021 0125   O2SAT 96.9 12/19/2021 0125     Coagulation Profile: No results for input(s): "INR", "PROTIME" in the last 168 hours.  Cardiac Enzymes: Recent Labs  Lab 12/18/21 1000  CKTOTAL 153    HbA1C: Hgb A1c MFr Bld  Date/Time Value Ref Range Status  12/08/2021 10:21 AM 5.5 4.8 - 5.6 % Final    Comment:    (NOTE) Pre diabetes:          5.7%-6.4%  Diabetes:               >6.4%  Glycemic control for   <7.0% adults with diabetes     CBG: Recent Labs  Lab 12/18/21 1128 12/18/21 1643 12/18/21 1938 12/18/21 2310 12/19/21 0322  GLUCAP 146* 139* 150* 143* 149*    Review of Systems:   Patient is encephalopathic and/or intubated. Therefore history has been obtained from chart review.   Past Medical History:  He,  has a past medical history of Hypercholesteremia and Pulmonary embolism ([place]).   Surgical History:   Past Surgical History:  Procedure Laterality Date   INSERTION OF DIALYSIS CATHETER N/A 12/09/2021   Procedure: INSERTION OF DIALYSIS CATHETER;  Surgeon: Jacky Kindle, MD;  Location: [facility];  Service: Pulmonary;  Laterality: N/A;   KNEE SURGERY     right     Social History:   reports that he has never smoked. He has never used smokeless tobacco. He reports that he  does not drink alcohol and does not use drugs.   Family History:  His family history includes Asthma in his paternal grandfather; Heart disease in his father.   Allergies Allergies  Allergen Reactions   Diphenhydramine     Other reaction(s): prostate swelling, increase urination   Penicillins Other (See Comments)    "childhood reaction from mother". Per patient's wife, the patient was never sure about this allergy. The patient has taken amoxicillin previously without issue.   Trazodone Hcl     Other reaction(s): weak stream     Home Medications  Prior to Admission medications   Medication Sig Start Date End Date Taking? Authorizing Provider  ascorbic acid (VITAMIN C) 500 MG tablet Take 1,000 mg by mouth daily.   Yes [provider]  bismuth subsalicylate (PEPTO BISMOL) 262 MG/15ML suspension Take 30 mLs by mouth every 6 (six) hours as needed for diarrhea or loose stools.   Yes [provider]  busPIRone (BUSPAR) 7.5 MG tablet Take 15 mg by mouth 2 (two) times daily as needed (anxiety). 10/13/21  Yes [provider]   Calcium-Magnesium-Vitamin D (CALCIUM MAGNESIUM PO) Take 5 mLs by mouth daily. Oral liquid   Yes [provider]  Cholecalciferol (VITAMIN D3) 125 MCG (5000 UT) TABS Take 1 tablet by mouth daily.   Yes [provider]  IVERMECTIN PO Take 18 mg by mouth daily. Ivermectin oil '10mg'$ /ml suspension.   Yes [provider]  predniSONE (DELTASONE) 5 MG tablet Take 5-15 mg by mouth See admin instructions. Take 15 mg for 5 days, then take 10 mg for 5 days, and then take 5 mg for 5 days and then stop. 12/02/21  Yes [provider]  Rivaroxaban (XARELTO) 20 MG TABS tablet Take 1 tablet (20 mg total) by mouth daily with supper. Start taking March 29th, 2015 Patient taking differently: Take 20 mg by mouth daily with supper. 04/23/13  Yes Theodis Blaze, MD  traZODone (DESYREL) 50 MG tablet Take 25 mg by mouth at bedtime as needed for sleep.   Yes [provider]  zinc gluconate 50 MG tablet Take 50 mg by mouth daily.   Yes [provider]  benzonatate (TESSALON) 200 MG capsule Take 200 mg by mouth 3 (three) times daily as needed. Patient not taking: Reported on 12/06/2021 11/21/21   [provider]  dexamethasone (DECADRON) 6 MG tablet Take 6 mg by mouth once. Patient not taking: Reported on 12/06/2021 11/15/21   [provider]  Multiple Vitamin (MULTIVITAMIN WITH MINERALS) TABS tablet Take 1 tablet by mouth daily. Patient not taking: Reported on 12/06/2021    [provider]  omeprazole (PRILOSEC) 20 MG capsule Take 20 mg by mouth daily. Patient not taking: Reported on 12/06/2021    [provider]  saw palmetto 160 MG capsule Take 160 mg by mouth daily. Patient not taking: Reported on 12/06/2021    [provider]     Critical care time: 39 minutes     Georgann Housekeeper, AGACNP-BC [place] for personal pager PCCM on call pager [phone] until 7pm. Please call [place]  7p-7a. [phone]  12/19/2021 4:13 AM

## 2021-12-19 NOTE — Progress Notes (Signed)
[place] Progress Note Patient Name: Spencer Ross DOB: Oct 08, 1950 MRN: [id number]   Date of Service  12/19/2021  HPI/Events of Note  37 M history PE/APLA on rivaroxaban, recetn COVID treated with ivermectin and steroids initially presented 11/11 with AMS. Concern for autoimmune encephalitis vs Creutzfeld-jakob. Has been treated with steroids and PLEX without much response. Transferred to Icu overnight 11/24 due to worsening mental status and concern for airway compromise.  eICU Interventions  Work up in progress ICU monitoring as high intubation risk     Intervention Category Evaluation Type: New Patient Evaluation  Judd Lien 12/19/2021, 5:06 AM

## 2021-12-19 NOTE — Progress Notes (Signed)
[place] Progress Note Patient Name: Spencer Ross DOB: [birth year] MRN: [id number]   Date of Service  12/19/2021  HPI/Events of Note  K+ 3.2  eICU Interventions  K+ replaced per E-Link electrolyte replacement protocol.        Kerry Kass Brianne Maina 12/19/2021, 11:11 PM

## 2021-12-19 NOTE — Progress Notes (Signed)
75: Spencer Ross with Rapid Response notified of pt's condition chg and that Critical Care Team was coming down to evaluate 0357: Critical Care Team here to assess pt and discussed care with Dr. Irene Pap  0408: CXR and KUB done. Bld CX peripheral ordered but not done. New order for Zosyn IVPB sent with pt to 4N rm 31. Report called to [facility]N Neuro ICU nurse.  Pt left unit per bed with chg nurse Phyl RN and Rapid response nurse Kaser RN with EEG monitor and tele sitter monitor. Tele sitter notified of transfer and 4N informed tele sitter camera needs to be plugged in so they can get a picture and reading started 0420: Tele monitor tech notified of transfer

## 2021-12-19 NOTE — Progress Notes (Signed)
Talked with [facility] on call and gave him an update on condition. He stated he will review the EEG and if there is anything different or anything different needs to be done he'll write orders and call me. If Dr. Nevada Crane wants to order an IVF bolus she can. She will handle the medical aspect of the case.

## 2021-12-19 NOTE — Progress Notes (Signed)
Pharmacy Antibiotic Note  Spencer Ross is a 71 y.o. male admitted on 12/05/2021 with confusion after Covid Dx, now with concern for sepsis.  Pharmacy has been consulted for Zosyn dosing.  PCN allergy noted but reported as unclear childhood rxn, pt has taken amoxicillin without issue.  Plan: Zosyn 3.375g IV q8h (4 hour infusion).  Height: '6\' 5"'$  (195.6 cm) Weight: 81.8 kg (180 lb 5.4 oz) IBW/kg (Calculated) : 89.1  Temp (24hrs), Avg:99.1 F (37.3 C), Min:98.4 F (36.9 C), Max:100.1 F (37.8 C)  Recent Labs  Lab 12/13/21 0355 12/14/21 0415 12/15/21 0430 12/16/21 0422 12/17/21 0704 12/18/21 0245 12/19/21 0120 12/19/21 0123  WBC 9.8   < > 12.1* 19.3* 17.4* 21.5*  --  22.1*  CREATININE 1.30*  --   --  2.09* 1.37* 2.76*  --  1.09  LATICACIDVEN  --   --   --   --   --   --  1.7  --    < > = values in this interval not displayed.    Estimated Creatinine Clearance: 75 mL/min (by C-G formula based on SCr of 1.09 mg/dL).    Allergies  Allergen Reactions   Diphenhydramine     Other reaction(s): prostate swelling, increase urination   Penicillins Other (See Comments)    "childhood reaction from mother". Per patient's wife, the patient was never sure about this allergy. The patient has taken amoxicillin previously without issue.   Trazodone Hcl     Other reaction(s): weak stream     Thank you for allowing pharmacy to be a part of this patient's care.  Wynona Neat, PharmD, BCPS  12/19/2021 3:39 AM

## 2021-12-19 NOTE — Progress Notes (Signed)
[place] Progress Note Patient Name: Spencer Ross DOB: [birth year] MRN: [id number]   Date of Service  12/19/2021  HPI/Events of Note  Patient with idiopathic encephalopathy, he needs renewal of restraints order to ensure his safety, and that he does not pull out his lines.  eICU Interventions  Restraints order renewed.        Kerry Kass Aroura Vasudevan 12/19/2021, 10:51 PM

## 2021-12-19 NOTE — Progress Notes (Signed)
  Echocardiogram 2D Echocardiogram has been performed.  Lana Fish 12/19/2021, 12:44 PM

## 2021-12-19 NOTE — Progress Notes (Signed)
LTM EEG discontinued - no skin breakdown at unhook.   

## 2021-12-19 NOTE — Progress Notes (Signed)
Subjective: He had a worsening mental status overnight, and was transferred to the ICU, but more recently has woken up some.  Apparently set up order to to his son.  Exam: Vitals:   12/19/21 1132 12/19/21 1200  BP: 124/64   Pulse:    Resp: (!) 24   Temp: 98.8 F (37.1 C) 98 F (36.7 C)  SpO2: 95%    Gen: In bed, he continues to have an exaggerated startle  Neuro: MS: Patient is lethargic but arousable, he will orient eyes to voice, but does not fixate or track. CN: He crosses midline in both directions, I do not see him make any attempts at smooth pursuit, I only see saccades or possibly severely saccadic smooth pursuit.  No definite nystagmus. Motor: Though he moves all extremities, he has significantly increased tone in his left arm and leg.  On previous days I thought this was paratonia, but it appears to be developing spasticity.   Sensory: He responds to mild stimulation bilaterally   Pertinent Labs: 1.2 -> 2.09 -> 1.37 -> 2.76 today  Previous workup: 11/11 CSF cell count and differential, zero RBC, zero WBC Protein and glucose were not performed given concerns for contaminating machines in the setting of sending CJD testing IgG index-normal CSF VDRL-negative Infectious meningoencephalitis panel-negative  Anti-dsDNA normal Anti-Smith normal ANA normal SSA, SSB normal Thyroperoxidase antibodies-normal Mildly elevated thyroglobulin antibody RPR-nonreactive B1-113 B12-426 Ammonia-11 TSH-0.9  Serum paraneoplastic panel-pending CJD testing-pending   Impression: 71 year old male with progressive personality/behavior changes progressing to encephalopathy in the setting of recent COVID infection 3 weeks prior to presentation.  It sounds like he was having some neuropsychiatric symptoms of anxiety and insomnia for about a month prior to his COVID infection. MRI shows restricted diffusion in a cortical ribbon pattern with focal right hemispheric slowing on EEG.  Given  the rapid progression, autoimmune encephalitis has been given strong consideration and he has received course of IV Solu-Medrol and is currently scheduled for treatment #4 of plasma exchange.  The progression, though fast for CJD, I think it is possible to align with this given his neuropsychiatric symptoms prior to the COVID infection.  He does have periodic discharges, but no seizures, I will continue EEG again overnight tonight, but will discontinue it if no further seizures tomorrow.  He has not responded in any meaningful way to plasma exchange, we will continue to treat this as an autoimmune process at this time.   I am beginning to favor CJD as a diagnosis, but unfortunately we will not have either autoimmune testing, or CJD testing back in a timely fashion.  And even if the autoimmune testing was negative, I still think given the rapid progression it would not be unreasonable to try one more attempt at immunosuppression with rituximab.  Biopsy would be another possibility, but I am not certain this will be back within the window to significantly modify her treatment given his progressive decline.  It would also come with risks of contaminating materials.  Recommendations: 1) can discontinue EEG 2) Plex treatment 5/5 today. 3) I will reach out to [facility] to see if there would be any treatment/diagnostic options that they would have available that we do not have available here.   4) neurology will continue to follow  Roland Rack, MD [facility] [phone]  If 7pm- 7am, please page neurology on call as listed in [place].

## 2021-12-19 NOTE — Progress Notes (Signed)
Pt more lethargic. Barely opening eyes to painful stimuli and only flinches slightly to painful stimuli when around 2000 he was responsive slightly to verbal stimuli. Pt didn't flinch or resist to CBG done at this time. BP 95/59 MAP 69. Dr. Irene Pap notified and will be up to see pt. Also asked me to contact Neuro on the case. Will write orders.

## 2021-12-19 NOTE — Procedures (Addendum)
Patient Name: Spencer Ross  MRN: [id number]  Epilepsy Attending: Lora Havens  Referring Physician/Provider: Greta Doom, MD  Duration: 12/18/2021 1648 to 12/19/2021 1011   Patient history: 71yo M with ams. EEG to evaluate for seizure   Level of alertness: Awake, asleep   AEDs during EEG study: VPA   Technical aspects: This EEG study was done with scalp electrodes positioned according to the 10-20 International system of electrode placement. Electrical activity was reviewed with band pass filter of 1-'70Hz'$ , sensitivity of 7 uV/mm, display speed of 6m/sec with a '60Hz'$  notched filter applied as appropriate. EEG data were recorded continuously and digitally stored.  Video monitoring was available and reviewed as appropriate.   Description: No clear posterior dominant rhythm was seen. Sleep was characterized by sleep spindles (12-'14hz'$ ), maximal fronto-central region. EEG also showed continuous generalized 3 to 7 Hz theta-delta slowing in left hemisphere and 2-'3Hz'$  delta slowing in right hemisphere. At times, lateralized periodic discharges were noted in right hemisphere, maximal right temporal region at 1-1.5 hz. Hyperventilation and photic stimulation were not performed.      ABNORMALITY - Lateralized periodic discharges, right hemisphere (LPD) - Continuous slow, generalized and lateralized right hemisphere   IMPRESSION: This study showed evidence of epileptogenicity and cortical dysfunction arising from right hemisphere likely secondary to underlying structural abnormality. Additionally there is moderate diffuse encephalopathy, nonspecific etiology. No definite  seizures were seen throughout the recording.   Carsten Carstarphen OBarbra Sarks

## 2021-12-19 NOTE — Progress Notes (Signed)
Received a call from bedside RN regarding the patient being lethargic and barely responding to painful stimuli.  The patient is on continuous EEG monitoring for severe encephalopathy, followed by neurology for possible autoimmune encephalitis on IV Solu-Medrol and plasma exchange.  Presented at bedside.  The patient is obtunded and minimally responsive to painful stimuli.  Does not follow commands, which is not new per bedside RN.  Stat labs and ABG ordered and revealed pH 7.49/29/76 on room air.  Neurology made aware of obtundation.    Returned to bedside, continues to be obtunded and nearly unresponsive.  Noted increase in temperature 100.1 and worsening leukocytosis.  Now hypotensive with SBP less than 90.  1 L IV fluid bolus NS x 1 ordered to be administered.  Midodrine per tube 10 mg 3 times daily x 3 doses to maintain MAP greater than 65.  Peripheral blood cultures ordered.  Chest x-ray and abdominal x-ray ordered.  Broaden antibiotics to Zosyn.  PCCM consulted to assist with the management.

## 2021-12-19 NOTE — Progress Notes (Signed)
Pt transferred to 4N31 with 3W RN and RRT. Tx unevenful.

## 2021-12-19 NOTE — Progress Notes (Signed)
Dr. Irene Pap here to see pt. Pt remains very obtunded. Tube fdg turned off per her verbal orders and she is consulting with Critical Care Team.

## 2021-12-19 NOTE — Progress Notes (Signed)
NAME:  Spencer Ross, MRN:  [id number], DOB:  [birth year], LOS: 31 ADMISSION DATE:  12/05/2021, CONSULTATION DATE:  11/24 REFERRING MD:  Dr. Maryland Pink, CHIEF COMPLAINT:  Encephalopathy   History of Present Illness:  Patient is encephalopathic and/or intubated. Therefore history has been obtained from chart review.  71 year old male with PMH as below, which is significant for PE, APLA on Xarelto, BPH, GERD, and HLD. Recent course significant for COVID-19 diagnosis 3 weeks prior to presentation. This was treated with ivermectin and steroids. He presented to [facility] ED 11/11 with complaints of progressive confusion and behavioral changes. He was admitted to the hospitalist service with what was thought to be COVID related encephalopathy. Neurology was consulted and presented a differential of autoimmune encephalitis, viral encephalitis, and Creutzfeldt-Jakob disease. Workup since that time has been rather benign although not all diagnostics had resulted. Experts at tertiary care center were consulted and suggested treatment with steroids and plasma exchange.  PLEX was started 11/15. Unfortunately he had not responded to treatment in any meaningful way. In the early AM hours of 11/24 he became less responsive. PCCM was consulted for worsening mental status and airway concerns.   Pertinent  Medical History   has a past medical history of Hypercholesteremia and Pulmonary embolism ([place]).   Significant Hospital Events: Including procedures, antibiotic start and stop dates in addition to other pertinent events   11/11 admit for encephalopathy with ddx autoimmune encephalitis, viral encephalitis, and Creutzfeldt-Jakob disease 11/14 Vasc cath placed 11/15 PLEX started 11/24 PCCM consult for poor mentation. Tx to ICU.   Interim History / Subjective:   Transferred to ICU overnight for worsening mental status and airway monitoring  Objective   Blood pressure 105/66, pulse 86, temperature 100.1 F  (37.8 C), temperature source Axillary, resp. rate (!) 23, height '6\' 5"'$  (1.956 m), weight 81.8 kg, SpO2 92 %.        Intake/Output Summary (Last 24 hours) at 12/19/2021 0732 Last data filed at 12/19/2021 0630 Gross per 24 hour  Intake 5644.22 ml  Output 3725 ml  Net 1919.22 ml   Filed Weights   12/06/21 2217 12/08/21 0214 12/10/21 0901  Weight: 81.3 kg 81.8 kg 81.8 kg    Examination: Gen:      No acute distress HEENT:  EOMI, sclera anicteric Neck:     No masses; no thyromegaly Lungs:    Clear to auscultation bilaterally; normal respiratory effort CV:         Regular rate and rhythm; no murmurs Abd:      + bowel sounds; soft, non-tender; no palpable masses, no distension Ext:    No edema; adequate peripheral perfusion Skin:      Warm and dry; no rash Neuro: Somnolent, opens eyes to painful stimuli  Labs/imaging reviewed Significant for potassium 2.8, BUN/creatinine 17/1.09 AST 22, ALT 13, WBC 22.1, hemoglobin 11.5, platelets 173 Chest x-ray today with left base opacity.   Resolved Hospital Problem list     Assessment & Plan:   Acute encephalopathy: etiology not well understood. Neurology has been consulting. Infectious and autoimmune workup negative. Creutzfeldt-Jakob disease tests pending. The patient has been treated with steroids and plasma exchange without appreciable improvement Continue IV monitoring in ICU Neurology is on board Further workup including paraneoplastic panel and CKD tests are pending Follow EEG Continue PLEX 5/5 , Depakote.  Dysphagia: secondary to above - Cortrak, TF  AKI: improved Hypokalemia Potassium repleted.  Recheck labs  Hypotension Continue midodrine  Urinary retention Hematuria -  Keep foley - Irrigation PRN - Bladder mass identified on ultrasound felt to represent clot. D/w urology previously.  - May need CBI if significant bladder/foley obstruction.   Antiphospholipid antibody syndrome History of PE: 6 years ago Heparin  on hold due to hematuria Xarelto on hold.   Leukocytosis: 2/2 steroids, UA abnormal as well On Zosyn.  Follow cultures.  Severe protein calorie nutrition Tube feeds  Best Practice (right click and "Reselect all SmartList Selections" daily)   Diet/type: tubefeeds DVT prophylaxis: systemic heparin GI prophylaxis: PPI Lines: Central line Foley:  Yes, and it is still needed Code Status:  full code Last date of multidisciplinary goals of care discussion '[ ]'$   Critical care time:    The patient is critically ill with multiple organ system failure and requires high complexity decision making for assessment and support, frequent evaluation and titration of therapies, advanced monitoring, review of radiographic studies and interpretation of complex data.   Critical Care Time devoted to patient care services, exclusive of separately billable procedures, described in this note is 35 minutes.   Marshell Garfinkel MD [facility] See Amion for pager  If no response to pager , please call [phone] until 7pm After 7:00 pm call Elink  [phone] 12/19/2021, 7:38 AM

## 2021-12-20 ENCOUNTER — Inpatient Hospital Stay: Payer: Self-pay

## 2021-12-20 DIAGNOSIS — G9340 Encephalopathy, unspecified: Secondary | ICD-10-CM | POA: Diagnosis not present

## 2021-12-20 LAB — CBC
HCT: 32.1 % — ABNORMAL LOW (ref 39.0–52.0)
HCT: 31.6 % — ABNORMAL LOW (ref 39.0–52.0)
Hemoglobin: 10.4 g/dL — ABNORMAL LOW (ref 13.0–17.0)
Hemoglobin: 10.8 g/dL — ABNORMAL LOW (ref 13.0–17.0)
MCH: 30.3 pg (ref 26.0–34.0)
MCH: 30.6 pg (ref 26.0–34.0)
MCHC: 32.4 g/dL (ref 30.0–36.0)
MCHC: 34.2 g/dL (ref 30.0–36.0)
MCV: 93.6 fL (ref 80.0–100.0)
MCV: 89.5 fL (ref 80.0–100.0)
Platelets: 140 10*3/uL — ABNORMAL LOW (ref 150–400)
Platelets: 137 10*3/uL — ABNORMAL LOW (ref 150–400)
RBC: 3.43 MIL/uL — ABNORMAL LOW (ref 4.22–5.81)
RBC: 3.53 MIL/uL — ABNORMAL LOW (ref 4.22–5.81)
RDW: 15.6 % — ABNORMAL HIGH (ref 11.5–15.5)
RDW: 15.3 % (ref 11.5–15.5)
WBC: 15.1 10*3/uL — ABNORMAL HIGH (ref 4.0–10.5)
WBC: 14.1 10*3/uL — ABNORMAL HIGH (ref 4.0–10.5)
nRBC: 0 % (ref 0.0–0.2)
nRBC: 0 % (ref 0.0–0.2)

## 2021-12-20 LAB — BASIC METABOLIC PANEL
Anion gap: 4 — ABNORMAL LOW (ref 5–15)
Anion gap: 9 (ref 5–15)
BUN: 12 mg/dL (ref 8–23)
BUN: 11 mg/dL (ref 8–23)
CO2: 21 mmol/L — ABNORMAL LOW (ref 22–32)
CO2: 24 mmol/L (ref 22–32)
Calcium: 7.5 mg/dL — ABNORMAL LOW (ref 8.9–10.3)
Calcium: 7.9 mg/dL — ABNORMAL LOW (ref 8.9–10.3)
Chloride: 117 mmol/L — ABNORMAL HIGH (ref 98–111)
Chloride: 112 mmol/L — ABNORMAL HIGH (ref 98–111)
Creatinine, Ser: 0.67 mg/dL (ref 0.61–1.24)
Creatinine, Ser: 0.65 mg/dL (ref 0.61–1.24)
GFR, Estimated: >60 mL/min (ref >60)
GFR, Estimated: >60 mL/min (ref >60)
Glucose, Bld: 124 mg/dL — ABNORMAL HIGH (ref 70–99)
Glucose, Bld: 115 mg/dL — ABNORMAL HIGH (ref 70–99)
Potassium: 3.4 mmol/L — ABNORMAL LOW (ref 3.5–5.1)
Potassium: 3 mmol/L — ABNORMAL LOW (ref 3.5–5.1)
Sodium: 142 mmol/L (ref 135–145)
Sodium: 145 mmol/L (ref 135–145)

## 2021-12-20 LAB — GLUCOSE, CAPILLARY
Glucose-Capillary: 113 mg/dL — ABNORMAL HIGH (ref 70–99)
Glucose-Capillary: 115 mg/dL — ABNORMAL HIGH (ref 70–99)
Glucose-Capillary: 118 mg/dL — ABNORMAL HIGH (ref 70–99)
Glucose-Capillary: 128 mg/dL — ABNORMAL HIGH (ref 70–99)
Glucose-Capillary: 92 mg/dL (ref 70–99)
Glucose-Capillary: 99 mg/dL (ref 70–99)

## 2021-12-20 LAB — URINE CULTURE: Culture: >=100000 — AB

## 2021-12-20 LAB — MAGNESIUM: Magnesium: 2 mg/dL (ref 1.7–2.4)

## 2021-12-20 MED ORDER — POTASSIUM CHLORIDE 10 MEQ/50ML IV SOLN
10.0000 meq | INTRAVENOUS | Status: AC
  Administered 2021-12-20 (×6): 10 meq via INTRAVENOUS
  Filled 2021-12-20 (×6): qty 50

## 2021-12-20 MED ORDER — LIDOCAINE HCL URETHRAL/MUCOSAL 2 % EX GEL
1.0000 | Freq: Once | CUTANEOUS | Status: DC
  Filled 2021-12-20: qty 11

## 2021-12-20 MED ORDER — CALCIUM GLUCONATE-NACL 2-0.675 GM/100ML-% IV SOLN
2.0000 g | Freq: Once | INTRAVENOUS | Status: AC
  Administered 2021-12-20: 2000 mg via INTRAVENOUS
  Filled 2021-12-20: qty 100

## 2021-12-20 MED ORDER — SODIUM CHLORIDE 0.9 % IV SOLN
1.0000 g | INTRAVENOUS | Status: AC
  Administered 2021-12-20 – 2021-12-23 (×4): 1 g via INTRAVENOUS
  Filled 2021-12-20 (×5): qty 10

## 2021-12-20 MED ORDER — POTASSIUM CHLORIDE CRYS ER 20 MEQ PO TBCR: 40.0000 meq | EXTENDED_RELEASE_TABLET | Freq: Once | ORAL | Status: DC

## 2021-12-20 MED ORDER — POTASSIUM CHLORIDE 20 MEQ PO PACK
40.0000 meq | PACK | Freq: Once | ORAL | Status: AC
  Administered 2021-12-20: 40 meq via ORAL
  Filled 2021-12-20: qty 2

## 2021-12-20 MED ORDER — MIDAZOLAM HCL 2 MG/2ML IJ SOLN: 2.0000 mg | Freq: Once | INTRAMUSCULAR | Status: DC

## 2021-12-20 NOTE — Progress Notes (Signed)
[facility] Progress Note Patient Name: Spencer Ross DOB: 15-Jun-1950 MRN: [id number]   Date of Service  12/20/2021  HPI/Events of Note  Patient pulled out his catheter. Slight meatal bleeding.  eICU Interventions  [place] to prevent recurrence, catheter re-ordered. Monitor for significant meatal bleeding.        Kerry Kass Lenus Trauger 12/20/2021, 2:55 AM

## 2021-12-20 NOTE — Progress Notes (Signed)
Pharmacy Electrolyte Replacement  Recent Labs:  Recent Labs    12/20/21 0545  K 3.4*  MG 2.0  CREATININE 0.67    Low Critical Values (K </= 2.5, Phos </= 1, Mg </= 1) Present: None  Plan: Corrected calcium 8.5, replete 2g calcium gluconate. K already repleted this AM.   Esmeralda Arthur, PharmD

## 2021-12-20 NOTE — Progress Notes (Signed)
Orthopedic Tech Progress Note Patient Details:  Spencer Ross 1950-02-13 [id number]  Patient ID: Spencer Ross, male   DOB: August 17, 1950, 71 y.o.   MRN: [id number]  [name] 12/20/2021, 5:05 PM Left prafo boot applied

## 2021-12-20 NOTE — Progress Notes (Signed)
Subjective: Slightly more awake today  I spoke with the [facility] testing [facility], his testing is complete but needed to be signed off by pathologist.  Reports should be done on Monday.  [facility] declined due to bed availability  Exam: Vitals:   12/20/21 1500 12/20/21 1600  BP: 126/75   Pulse: 77   Resp: 14   Temp:  99.3 F (37.4 C)  SpO2: 97%    Gen: In bed, he continues to have an exaggerated startle  Neuro: MS: Patient is awake he will fixate and track with saccadic smooth pursuit  CN: He crosses midline in both directions  Motor: Though he moves all extremities, he has significantly increased tone in his left arm and leg.  He has some spasticity of the left arm and leg Sensory: He responds to mild stimulation bilaterally   Pertinent Labs: 1.2 -> 2.09 -> 1.37 -> 2.76 today  Previous workup: 11/11 CSF cell count and differential, zero RBC, zero WBC Protein and glucose were not performed given concerns for contaminating machines in the setting of sending CJD testing IgG index-normal CSF VDRL-negative Infectious meningoencephalitis panel-negative  Anti-dsDNA normal Anti-Smith normal ANA normal SSA, SSB normal Thyroperoxidase antibodies-normal Mildly elevated thyroglobulin antibody RPR-nonreactive B1-113 B12-426 Ammonia-11 TSH-0.9  Serum paraneoplastic panel-pending CJD testing-pending   Impression: 71 year old male with progressive personality/behavior changes progressing to encephalopathy in the setting of recent COVID infection 3 weeks prior to presentation.  It sounds like he was having some neuropsychiatric symptoms of anxiety and insomnia for about a month prior to his COVID infection. MRI shows restricted diffusion in a cortical ribbon pattern with focal right hemispheric slowing on EEG.  Given the rapid progression, autoimmune encephalitis has been given strong consideration and he has received course of IV Solu-Medrol and is currently scheduled for  treatment #4 of plasma exchange.  The progression, though fast for CJD, I think it is possible to align with this given his neuropsychiatric symptoms prior to the COVID infection.  He does have periodic discharges, but no seizures, I will continue EEG again overnight tonight, but will discontinue it if no further seizures tomorrow.  He has not responded in any meaningful way to plasma exchange, we will continue to treat this as an autoimmune process at this time.   I am beginning to favor CJD as a diagnosis, and we should have testing back relatively soon.  If this EGD testing is negative, then even if the autoimmune testing was negative, I still think given the rapid progression it would not be unreasonable to try one more attempt at immunosuppression with rituximab.  Biopsy would be another possibility, but I am not certain this will be back within the window to significantly modify her treatment given his progressive decline.  Recommendations: 1) follow-up CJD results on Monday 2) neurology will continue to follow  Roland Rack, MD [facility] [phone]  If 7pm- 7am, please page neurology on call as listed in [place].

## 2021-12-20 NOTE — Progress Notes (Signed)
NAME:  Spencer Ross, MRN:  [id number], DOB:  06-02-1950, LOS: 79 ADMISSION DATE:  12/05/2021, CONSULTATION DATE:  11/24 REFERRING MD:  Dr. Maryland Pink, CHIEF COMPLAINT:  Encephalopathy   History of Present Illness:  Patient is encephalopathic and/or intubated. Therefore history has been obtained from chart review.  71 year old male with PMH as below, which is significant for PE, APLA on Xarelto, BPH, GERD, and HLD. Recent course significant for COVID-19 diagnosis 3 weeks prior to presentation. This was treated with ivermectin and steroids. He presented to [facility] ED 11/11 with complaints of progressive confusion and behavioral changes. He was admitted to the hospitalist service with what was thought to be COVID related encephalopathy. Neurology was consulted and presented a differential of autoimmune encephalitis, viral encephalitis, and Creutzfeldt-Jakob disease. Workup since that time has been rather benign although not all diagnostics had resulted. Experts at tertiary care center were consulted and suggested treatment with steroids and plasma exchange.  PLEX was started 11/15. Unfortunately he had not responded to treatment in any meaningful way. In the early AM hours of 11/24 he became less responsive. PCCM was consulted for worsening mental status and airway concerns.   Pertinent  Medical History   has a past medical history of Hypercholesteremia and Pulmonary embolism ([place]).   Significant Hospital Events: Including procedures, antibiotic start and stop dates in addition to other pertinent events   11/11 admit for encephalopathy with ddx autoimmune encephalitis, viral encephalitis, and Creutzfeldt-Jakob disease 11/14 Vasc cath placed 11/15 PLEX started 11/24 PCCM consult for poor mentation. Tx to ICU. Finished PLEX course 5/5   Interim History / Subjective:   Patient pulled out Foley's catheter with bleeding around the meatus. Finished day 5 of Plex yesterday  Objective   Blood  pressure (!) 152/59, pulse 69, temperature 98.6 F (37 C), temperature source Axillary, resp. rate 17, height '6\' 5"'$  (1.956 m), weight 82.5 kg, SpO2 95 %.        Intake/Output Summary (Last 24 hours) at 12/20/2021 0709 Last data filed at 12/20/2021 0630 Gross per 24 hour  Intake 3663.27 ml  Output 1100 ml  Net 2563.27 ml   Filed Weights   12/08/21 0214 12/10/21 0901 12/20/21 0428  Weight: 81.8 kg 81.8 kg 82.5 kg    Examination: Blood pressure (!) 152/59, pulse 69, temperature 98.6 F (37 C), temperature source Axillary, resp. rate 17, height '6\' 5"'$  (1.956 m), weight 82.5 kg, SpO2 95 %. Gen:      No acute distress, bleeding around the Foley catheter. HEENT:  EOMI, sclera anicteric Neck:     No masses; no thyromegaly Lungs:    Clear to auscultation bilaterally; normal respiratory effort CV:         Regular rate and rhythm; no murmurs Abd:      + bowel sounds; soft, non-tender; no palpable masses, no distension Ext:    No edema; adequate peripheral perfusion Skin:      Warm and dry; no rash Neuro: Sedated, opens eyes to stimuli  Labs/imaging reviewed Significant for potassium of 3.4, creatinine 0.67 WBC 15.1, platelets 140 No new imaging  Resolved Hospital Problem list     Assessment & Plan:   Acute encephalopathy: etiology not well understood. Neurology has been consulting. Infectious and autoimmune workup negative. Creutzfeldt-Jakob disease tests pending. The patient has been treated with steroids and plasma exchange without appreciable improvement Continue monitoring in the ICU Neurology is on board. Finish 5 days of plasma exchange Further workup including paraneoplastic panel and  CKD tests are pending Follow EEG Continue Depakote.  Dysphagia: secondary to above - Cortrak, TF  AKI: improved Hypokalemia Potassium repleted.  Follow labs  Hypotension Continue midodrine  Urinary retention Hematuria - Keep foley - Irrigation PRN - Bladder mass identified on  ultrasound felt to represent clot. D/w urology previously.  - May need CBI if significant bladder/foley obstruction.   Antiphospholipid antibody syndrome History of PE: 6 years ago Heparin on hold due to hematuria Xarelto on hold.   Urosepsis, growing staph hemolyticus on cultures On Zosyn.  Narrow based on sensitivities  Severe protein calorie nutrition Tube feeds  Will attempt place PICC line and remove plasmapheresis catheter today.  Best Practice (right click and "Reselect all SmartList Selections" daily)   Diet/type: tubefeeds DVT prophylaxis: SCD GI prophylaxis: PPI Lines: Central line Foley:  Yes, and it is still needed Code Status:  full code Last date of multidisciplinary goals of care discussion '[ ]'$   Critical care time:    The patient is critically ill with multiple organ system failure and requires high complexity decision making for assessment and support, frequent evaluation and titration of therapies, advanced monitoring, review of radiographic studies and interpretation of complex data.   Critical Care Time devoted to patient care services, exclusive of separately billable procedures, described in this note is 35 minutes.   Marshell Garfinkel MD [place] [facility] See Amion for pager  If no response to pager , please call [phone] until 7pm After 7:00 pm call Elink  [phone] 12/20/2021, 7:09 AM

## 2021-12-21 DIAGNOSIS — G0490 Encephalitis and encephalomyelitis, unspecified: Secondary | ICD-10-CM | POA: Diagnosis not present

## 2021-12-21 DIAGNOSIS — G9340 Encephalopathy, unspecified: Secondary | ICD-10-CM | POA: Diagnosis not present

## 2021-12-21 LAB — CBC
HCT: 30.9 % — ABNORMAL LOW (ref 39.0–52.0)
Hemoglobin: 10.5 g/dL — ABNORMAL LOW (ref 13.0–17.0)
MCH: 31.3 pg (ref 26.0–34.0)
MCHC: 34 g/dL (ref 30.0–36.0)
MCV: 92.2 fL (ref 80.0–100.0)
Platelets: 142 10*3/uL — ABNORMAL LOW (ref 150–400)
RBC: 3.35 MIL/uL — ABNORMAL LOW (ref 4.22–5.81)
RDW: 15.5 % (ref 11.5–15.5)
WBC: 9.7 10*3/uL (ref 4.0–10.5)
nRBC: 0 % (ref 0.0–0.2)

## 2021-12-21 LAB — BASIC METABOLIC PANEL
Anion gap: 9 (ref 5–15)
BUN: 10 mg/dL (ref 8–23)
CO2: 27 mmol/L (ref 22–32)
Calcium: 8.1 mg/dL — ABNORMAL LOW (ref 8.9–10.3)
Chloride: 108 mmol/L (ref 98–111)
Creatinine, Ser: 0.6 mg/dL — ABNORMAL LOW (ref 0.61–1.24)
GFR, Estimated: >60 mL/min (ref >60)
Glucose, Bld: 131 mg/dL — ABNORMAL HIGH (ref 70–99)
Potassium: 3.2 mmol/L — ABNORMAL LOW (ref 3.5–5.1)
Sodium: 144 mmol/L (ref 135–145)

## 2021-12-21 LAB — GLUCOSE, CAPILLARY
Glucose-Capillary: 114 mg/dL — ABNORMAL HIGH (ref 70–99)
Glucose-Capillary: 120 mg/dL — ABNORMAL HIGH (ref 70–99)
Glucose-Capillary: 123 mg/dL — ABNORMAL HIGH (ref 70–99)
Glucose-Capillary: 124 mg/dL — ABNORMAL HIGH (ref 70–99)
Glucose-Capillary: 124 mg/dL — ABNORMAL HIGH (ref 70–99)
Glucose-Capillary: 84 mg/dL (ref 70–99)

## 2021-12-21 MED ORDER — FOLIC ACID 1 MG PO TABS
1.0000 mg | ORAL_TABLET | Freq: Every day | ORAL | Status: AC
  Administered 2021-12-21 – 2021-12-22 (×2): 1 mg
  Filled 2021-12-21: qty 1

## 2021-12-21 MED ORDER — SODIUM CHLORIDE 0.9% FLUSH: 10.0000 mL | INTRAVENOUS | Status: DC | PRN

## 2021-12-21 MED ORDER — ACETAMINOPHEN 325 MG PO TABS
650.0000 mg | ORAL_TABLET | ORAL | Status: DC | PRN
  Administered 2021-12-21 – 2021-12-24 (×2): 650 mg
  Filled 2021-12-21 (×2): qty 2

## 2021-12-21 MED ORDER — VALPROIC ACID 250 MG/5ML PO SOLN
500.0000 mg | Freq: Two times a day (BID) | ORAL | Status: DC
  Administered 2021-12-21 – 2021-12-25 (×9): 500 mg
  Filled 2021-12-21 (×9): qty 10

## 2021-12-21 MED ORDER — POTASSIUM CHLORIDE 20 MEQ PO PACK
40.0000 meq | PACK | Freq: Every day | ORAL | Status: DC
  Administered 2021-12-22 – 2021-12-25 (×4): 40 meq
  Filled 2021-12-21 (×4): qty 2

## 2021-12-21 MED ORDER — THIAMINE MONONITRATE 100 MG PO TABS
100.0000 mg | ORAL_TABLET | Freq: Every day | ORAL | Status: AC
  Administered 2021-12-21 – 2021-12-22 (×2): 100 mg
  Filled 2021-12-21: qty 1

## 2021-12-21 MED ORDER — DIPHENHYDRAMINE HCL 25 MG PO CAPS: 25.0000 mg | ORAL_CAPSULE | Freq: Four times a day (QID) | ORAL | Status: DC | PRN

## 2021-12-21 MED ORDER — ADULT MULTIVITAMIN W/MINERALS CH
1.0000 | ORAL_TABLET | Freq: Every day | ORAL | Status: DC
  Administered 2021-12-21 – 2021-12-25 (×5): 1
  Filled 2021-12-21 (×4): qty 1

## 2021-12-21 MED ORDER — POTASSIUM CHLORIDE 20 MEQ PO PACK
40.0000 meq | PACK | Freq: Every day | ORAL | Status: DC
  Administered 2021-12-21: 40 meq via ORAL
  Filled 2021-12-21: qty 2

## 2021-12-21 MED ORDER — POTASSIUM CHLORIDE 10 MEQ/50ML IV SOLN
10.0000 meq | INTRAVENOUS | Status: AC
  Administered 2021-12-21 (×6): 10 meq via INTRAVENOUS
  Filled 2021-12-21 (×6): qty 50

## 2021-12-21 MED ORDER — SODIUM CHLORIDE 0.9% FLUSH
10.0000 mL | Freq: Two times a day (BID) | INTRAVENOUS | Status: DC
  Administered 2021-12-21 – 2021-12-25 (×9): 10 mL

## 2021-12-21 NOTE — Progress Notes (Signed)
Subjective: No significant changes or events  Exam: Vitals:   12/21/21 1100 12/21/21 1200  BP: (!) 149/84 114/70  Pulse: 91 77  Resp: (!) 25 15  Temp:  99.7 F (37.6 C)  SpO2: 99% 100%   Gen: In bed, he continues to have an exaggerated startle  Neuro: MS: Patient is awake he will fixate and track with saccadic smooth pursuit  CN: He crosses midline in both directions, saccadic smooth pursuit Motor: Though he moves all extremities, he has increased tone in his left arm and leg.   Sensory: He responds to mild stimulation bilaterally   Pertinent Labs: Cr 0.6  Previous workup: 11/11 CSF cell count and differential, zero RBC, zero WBC Protein and glucose were not performed given concerns for contaminating machines in the setting of sending CJD testing IgG index-normal CSF VDRL-negative Infectious meningoencephalitis panel-negative  Anti-dsDNA normal Anti-Smith normal ANA normal SSA, SSB normal Thyroperoxidase antibodies-normal Mildly elevated thyroglobulin antibody RPR-nonreactive B1-113 B12-426 Ammonia-11 TSH-0.9  Serum paraneoplastic panel-pending CJD testing-pending   Impression: 71 year old male with progressive personality/behavior changes progressing to encephalopathy in the setting of recent COVID infection 3 weeks prior to presentation.  It sounds like he was having some neuropsychiatric symptoms of anxiety and insomnia for about a month prior to his COVID infection. MRI shows restricted diffusion in a cortical ribbon pattern with focal right hemispheric slowing on EEG.  Given the rapid progression, autoimmune encephalitis has been given strong consideration and he has received course of IV Solu-Medrol and is currently scheduled for treatment #4 of plasma exchange.  The progression, though fast for CJD, I think it is possible to align with this given his neuropsychiatric symptoms prior to the COVID infection.  He does have periodic discharges, but no seizures, I  will continue EEG again overnight tonight, but will discontinue it if no further seizures tomorrow.  Thought he has not responded in any meaningful way to plasma exchange, we will continue to treat this as an autoimmune process at this time.   I favor CJD as a diagnosis, and we should have testing back relatively soon.  If this testing is negative, then even if the autoimmune testing was negative, I still think given the rapid progression it would not be unreasonable to try one more attempt at immunosuppression with rituximab.  Biopsy would be another possibility, but I am not certain this will be back within the window to significantly modify our treatment given his progressive decline.  Recommendations: 1) follow-up CJD results on Monday 2) neurology will continue to follow  Roland Rack, MD [facility] [phone]  If 7pm- 7am, please page neurology on call as listed in [place].

## 2021-12-21 NOTE — Progress Notes (Signed)
Peripherally Inserted Central Catheter Placement  The IV Nurse has discussed with the patient and/or persons authorized to consent for the patient, the purpose of this procedure and the potential benefits and risks involved with this procedure.  The benefits include less needle sticks, lab draws from the catheter, and the patient may be discharged home with the catheter. Risks include, but not limited to, infection, bleeding, blood clot (thrombus formation), and puncture of an artery; nerve damage and irregular heartbeat and possibility to perform a PICC exchange if needed/ordered by physician.  Alternatives to this procedure were also discussed.  Bard Power PICC patient education guide, fact sheet on infection prevention and patient information card has been provided to patient /or left at bedside. Consent obtained on 11/25 by ICU RN. Discussed with wife via telephone 12/21/21. She denied any questions about the procedure.   PICC Placement Documentation  PICC Double Lumen 12/21/21 Right Brachial 41 cm 0 cm (Active)  Indication for Insertion or Continuance of Line Prolonged intravenous therapies 12/21/21 0955  Exposed Catheter (cm) 0 cm 12/21/21 0955  Site Assessment Clean, Dry, Intact 12/21/21 0955  Lumen #1 Status Saline locked;Flushed;Blood return noted 12/21/21 0955  Lumen #2 Status Saline locked;Flushed;Blood return noted 12/21/21 0955  Dressing Type Transparent;Securing device 12/21/21 0955  Dressing Status Antimicrobial disc in place;Clean, Dry, Intact 12/21/21 0955  Safety Lock Not Applicable 81/85/90 9311  Line Care Connections checked and tightened 12/21/21 0955  Line Adjustment (NICU/IV Team Only) No 12/21/21 0955  Dressing Intervention New dressing 12/21/21 0955  Dressing Change Due 12/28/21 12/21/21 [place], [place] Para [place] 12/21/2021, 9:57 AM

## 2021-12-21 NOTE — Progress Notes (Addendum)
NAME:  MATHHEW BUYSSE, MRN:  [id number], DOB:  1950/02/24, LOS: 89 ADMISSION DATE:  12/05/2021, CONSULTATION DATE:  11/24 REFERRING MD:  Dr. Maryland Pink, CHIEF COMPLAINT:  Encephalopathy   History of Present Illness:  Patient is encephalopathic and/or intubated. Therefore history has been obtained from chart review.  71 year old male with PMH as below, which is significant for PE, APLA on Xarelto, BPH, GERD, and HLD. Recent course significant for COVID-19 diagnosis 3 weeks prior to presentation. This was treated with ivermectin and steroids. He presented to [facility] ED 11/11 with complaints of progressive confusion and behavioral changes. He was admitted to the hospitalist service with what was thought to be COVID related encephalopathy. Neurology was consulted and presented a differential of autoimmune encephalitis, viral encephalitis, and Creutzfeldt-Jakob disease. Workup since that time has been rather benign although not all diagnostics had resulted. Experts at tertiary care center were consulted and suggested treatment with steroids and plasma exchange.  PLEX was started 11/15. Unfortunately he had not responded to treatment in any meaningful way. In the early AM hours of 11/24 he became less responsive. PCCM was consulted for worsening mental status and airway concerns.   Pertinent  Medical History   has a past medical history of Hypercholesteremia and Pulmonary embolism ([place]).  Significant Hospital Events: Including procedures, antibiotic start and stop dates in addition to other pertinent events   11/11 admit for encephalopathy with ddx autoimmune encephalitis, viral encephalitis, and Creutzfeldt-Jakob disease 11/14 Vasc cath placed 11/15 PLEX started 11/24 PCCM consult for poor mentation. Tx to ICU. Finished PLEX course 5/5   Interim History / Subjective:   Mental status remains unchanged  Objective   Blood pressure 124/77, pulse 77, temperature 98.6 F (37 C), temperature source  Axillary, resp. rate 17, height '6\' 5"'$  (1.956 m), weight 81.1 kg, SpO2 98 %.        Intake/Output Summary (Last 24 hours) at 12/21/2021 0736 Last data filed at 12/21/2021 0600 Gross per 24 hour  Intake 2095.92 ml  Output 1125 ml  Net 970.92 ml   Filed Weights   12/10/21 0901 12/20/21 0428 12/21/21 0436  Weight: 81.8 kg 82.5 kg 81.1 kg    Examination: Gen:      No acute distress HEENT:  EOMI, sclera anicteric Neck:     No masses; no thyromegaly Lungs:    Clear to auscultation bilaterally; normal respiratory effort CV:         Regular rate and rhythm; no murmurs Abd:      + bowel sounds; soft, non-tender; no palpable masses, no distension Ext:    No edema; adequate peripheral perfusion Skin:      Warm and dry; no rash Neuro: Sedated, opens eyes to stimuli  Labs/imaging reviewed Significant for potassium of 3.4, creatinine 0.67 WBC 15.1, platelets 140 No new imaging  Resolved Hospital Problem list     Assessment & Plan:   Acute encephalopathy: etiology not well understood. Neurology has been consulting. Infectious and autoimmune workup negative. Creutzfeldt-Jakob disease tests pending. The patient has been treated with steroids and plasma exchange without appreciable improvement Continue monitoring in the ICU Neurology is on board. Finish 5 days of plasma exchange Further workup including paraneoplastic panel and prion tests are pending Continue Depakote.  Dysphagia: secondary to above Cortrak, TF  AKI: improved Persistent Hypokalemia IV potassium ordered. Add standing order for PO repletion. Follow labs  Urinary retention Hematuria, Foley trauma with bleeding around the meatus. - Keep foley - Irrigation PRN -  Bladder mass identified on ultrasound felt to represent clot. D/w urology previously.  - May need CBI if significant bladder/foley obstruction.   Antiphospholipid antibody syndrome History of PE: 6 years ago Heparin on hold due to hematuria Xarelto on  hold.   Urosepsis, growing staph hemolyticus on cultures Antibiotics narrowed to ceftriaxone on 11/25. Continue for 5 days  Severe protein calorie nutrition Tube feeds  Will attempt place PICC line due to poor peripheral access and remove plasmapheresis catheter today.  Best Practice (right click and "Reselect all SmartList Selections" daily)   Diet/type: tubefeeds DVT prophylaxis: SCD GI prophylaxis: PPI Lines: Central line Foley:  Yes, and it is still needed Code Status:  full code Last date of multidisciplinary goals of care discussion '[ ]'$   Critical care time:    The patient is critically ill with multiple organ system failure and requires high complexity decision making for assessment and support, frequent evaluation and titration of therapies, advanced monitoring, review of radiographic studies and interpretation of complex data.   Critical Care Time devoted to patient care services, exclusive of separately billable procedures, described in this note is 35 minutes.   Marshell Garfinkel MD [facility] See Amion for pager  If no response to pager , please call [phone] until 7pm After 7:00 pm call Elink  [phone] 12/21/2021, 7:36 AM

## 2021-12-22 DIAGNOSIS — G9340 Encephalopathy, unspecified: Secondary | ICD-10-CM | POA: Diagnosis not present

## 2021-12-22 LAB — BASIC METABOLIC PANEL
Anion gap: 9 (ref 5–15)
BUN: 11 mg/dL (ref 8–23)
CO2: 25 mmol/L (ref 22–32)
Calcium: 7.7 mg/dL — ABNORMAL LOW (ref 8.9–10.3)
Chloride: 105 mmol/L (ref 98–111)
Creatinine, Ser: 0.54 mg/dL — ABNORMAL LOW (ref 0.61–1.24)
GFR, Estimated: >60 mL/min (ref >60)
Glucose, Bld: 116 mg/dL — ABNORMAL HIGH (ref 70–99)
Potassium: 3.5 mmol/L (ref 3.5–5.1)
Sodium: 139 mmol/L (ref 135–145)

## 2021-12-22 LAB — LYME DISEASE DNA BY PCR(BORRELIA BURG)

## 2021-12-22 LAB — CBC
HCT: 32.2 % — ABNORMAL LOW (ref 39.0–52.0)
Hemoglobin: 10.4 g/dL — ABNORMAL LOW (ref 13.0–17.0)
MCH: 30.5 pg (ref 26.0–34.0)
MCHC: 32.3 g/dL (ref 30.0–36.0)
MCV: 94.4 fL (ref 80.0–100.0)
Platelets: 150 10*3/uL (ref 150–400)
RBC: 3.41 MIL/uL — ABNORMAL LOW (ref 4.22–5.81)
RDW: 15.4 % (ref 11.5–15.5)
WBC: 8.2 10*3/uL (ref 4.0–10.5)
nRBC: 0 % (ref 0.0–0.2)

## 2021-12-22 LAB — GLUCOSE, CAPILLARY
Glucose-Capillary: 125 mg/dL — ABNORMAL HIGH (ref 70–99)
Glucose-Capillary: 119 mg/dL — ABNORMAL HIGH (ref 70–99)
Glucose-Capillary: 118 mg/dL — ABNORMAL HIGH (ref 70–99)

## 2021-12-22 MED ORDER — JUVEN PO PACK
1.0000 | PACK | Freq: Two times a day (BID) | ORAL | Status: DC
  Administered 2021-12-22 – 2021-12-25 (×6): 1
  Filled 2021-12-22 (×5): qty 1

## 2021-12-22 MED ORDER — RIVAROXABAN 20 MG PO TABS
20.0000 mg | ORAL_TABLET | Freq: Every day | ORAL | Status: DC
  Administered 2021-12-22 – 2021-12-24 (×3): 20 mg
  Filled 2021-12-22 (×4): qty 1

## 2021-12-22 MED ORDER — FREE WATER
160.0000 mL | Status: DC
  Administered 2021-12-22 – 2021-12-25 (×18): 160 mL

## 2021-12-22 MED ORDER — PROSOURCE TF20 ENFIT COMPATIBL EN LIQD
60.0000 mL | Freq: Every day | ENTERAL | Status: DC
  Administered 2021-12-22 – 2021-12-25 (×4): 60 mL
  Filled 2021-12-22 (×4): qty 60

## 2021-12-22 NOTE — Progress Notes (Signed)
NAME:  Spencer Ross, MRN:  [id number], DOB:  1950/04/10, LOS: 2 ADMISSION DATE:  12/05/2021, CONSULTATION DATE:  11/24 REFERRING MD:  Dr. Maryland Pink, CHIEF COMPLAINT:  Encephalopathy   History of Present Illness:  Patient is encephalopathic and/or intubated. Therefore history has been obtained from chart review.  71 year old male with PMH as below, which is significant for PE, APLA on Xarelto, BPH, GERD, and HLD. Recent course significant for COVID-19 diagnosis 3 weeks prior to presentation. This was treated with ivermectin and steroids. He presented to [facility] ED 11/11 with complaints of progressive confusion and behavioral changes. He was admitted to the hospitalist service with what was thought to be COVID related encephalopathy. Neurology was consulted and presented a differential of autoimmune encephalitis, viral encephalitis, and Creutzfeldt-Jakob disease. Workup since that time has been rather benign although not all diagnostics had resulted. Experts at tertiary care center were consulted and suggested treatment with steroids and plasma exchange.  PLEX was started 11/15. Unfortunately he had not responded to treatment in any meaningful way. In the early AM hours of 11/24 he became less responsive. PCCM was consulted for worsening mental status and airway concerns.   Pertinent  Medical History   has a past medical history of Hypercholesteremia and Pulmonary embolism ([place]).  Significant Hospital Events: Including procedures, antibiotic start and stop dates in addition to other pertinent events   11/11 admit for encephalopathy with ddx autoimmune encephalitis, viral encephalitis, and Creutzfeldt-Jakob disease 11/14 Vasc cath placed 11/15 PLEX started 11/24 PCCM consult for poor mentation. Tx to ICU. Finished PLEX course 5/5   Interim History / Subjective:   Mental status remains unchanged  Objective   Blood pressure 112/79, pulse 73, temperature 99.2 F (37.3 C), temperature  source Axillary, resp. rate 16, height '6\' 5"'$  (1.956 m), weight 80.7 kg, SpO2 99 %.        Intake/Output Summary (Last 24 hours) at 12/22/2021 0944 Last data filed at 12/22/2021 0800 Gross per 24 hour  Intake 1435.06 ml  Output 2075 ml  Net -639.94 ml    Filed Weights   12/20/21 0428 12/21/21 0436 12/22/21 0411  Weight: 82.5 kg 81.1 kg 80.7 kg    Examination: Gen:      No acute distress HEENT:  EOMI, sclera anicteric Neck:     No masses; no thyromegaly Lungs:    Clear to auscultation bilaterally; normal respiratory effort CV:         Regular rate and rhythm; no murmurs Abd:      + bowel sounds; soft, non-tender; no palpable masses, no distension Ext:    No edema; adequate peripheral perfusion Skin:      Warm and dry; no rash Neuro:  Opens eyes to voice and will resist mouth care. Moves all limbs.  Ancillary tests personally reviewed:   Cr:0.54  Assessment & Plan:   Acute encephalopathy: etiology not well understood. Neurology has been consulting. Infectious and autoimmune workup negative. Creutzfeldt-Jakob disease tests pending. The patient has been treated with steroids and plasma exchange without appreciable improvement Continue monitoring in the ICU Neurology is on board. Finish 5 days of plasma exchange Further workup including paraneoplastic panel and prion tests are pending Continue Depakote.  Dysphagia: secondary to above Cortrak, TF  Urinary retention Hematuria, Foley trauma with bleeding around the meatus. - Keep foley - Irrigation PRN - Bladder mass identified on ultrasound felt to represent clot. D/w urology previously.  - May need CBI if significant bladder/foley obstruction.   Antiphospholipid  antibody syndrome History of PE: 6 years ago Heparin on hold due to hematuria Xarelto on hold.   Urosepsis, growing staph hemolyticus on cultures Antibiotics narrowed to ceftriaxone on 11/25. Continue for 5 days  Severe protein calorie nutrition Tube  feeds  Ready to transfer as examination unchanged and has been protecting airway.   Best Practice (right click and "Reselect all SmartList Selections" daily)   Diet/type: tubefeeds DVT prophylaxis: SCD GI prophylaxis: PPI Lines: Central line Foley:  Yes, and it is still needed Code Status:  full code Last date of multidisciplinary goals of care discussion '[ ]'$   Kipp Brood, MD [facility] ICU Physician [place]  Pager: [phone] Or Epic Secure Chat After hours: [phone].  12/22/2021, 9:49 AM

## 2021-12-22 NOTE — Progress Notes (Signed)
Patient brought to 3W from 4N. VSS. Telemetry box applied.   Daymon Larsen, RN

## 2021-12-22 NOTE — Progress Notes (Signed)
Nutrition Follow-up  DOCUMENTATION CODES:   Severe malnutrition in context of acute illness/injury  INTERVENTION:  Continue tube feed regimen via Cortrak: -Jevity 1.5 at 60 mL/hour x 24 hours daily (1440 mL goal daily volume) -Also provide PROSource TF20 60 mL once daily per tube -Provides: 2240 kcal, 112 grams of protein, 1094 mL H2O daily  Provide free water flush of 160 mL every 4 hours. This will provide a total of 2054 mL H2O daily including water in tube feed regimen.  Provide Juven 1 packet per tube BID. Each packet provides 95 kcal, 7 grams L-Arginine, 7 grams L-Glutamine, 2.5 grams collagen protein, 300 mg vitamin C, 9.5 mg zinc, and other micronutrients essential for wound healing.  Continue multivitamin daily.  NUTRITION DIAGNOSIS:   Severe Malnutrition related to acute illness as evidenced by mild fat depletion, moderate muscle depletion, energy intake < or equal to 50% for > or equal to 5 days.  Ongoing - addressing with tube feeds.  GOAL:   Patient will meet greater than or equal to 90% of their needs  Met with tube feed regimen.  MONITOR:   PO intake, TF tolerance  REASON FOR ASSESSMENT:   Consult Enteral/tube feeding initiation and management  ASSESSMENT:   Pt is a 71yo M with PMH of antiphospholipid antibody, pulmonary embolism, hypercholesteremia and recent COVID-19 infection approximately 3 weeks PTA who presents with progressive confusion over the past few weeks.  11/11: admitted for encephalopathy with differential diagnosis autoimmune encephalitis, viral encephalitis, and Creutzfeldt-Jakob disease 11/14: Vas cath placed 11/15: PLEX started 11/22: Cortrak tube placed 11/24: worsening mentation, transferred to ICU, made NPO, finished PLEX course 5/5 11/27: transferred from 4N to 3W  Assessed patient at bedside. He was sleeping at time of RD assessment. Patient's son present who reports patient may have brief periods where he wakes up, but is  sleeping most of the day. Patient is currently NPO with Cortrak tube in place. Tube feeds were stopped for transfer, but son reports they were infusing at goal rate of 60 mL/hour prior to transfer and pt was tolerating well. Noted prior to being made NPO pt was on dysphagia 3 diet with thin liquids. Documented to have poor PO intake (0-25%) prior to being made NPO). Noted pt now documented to have stage II pressure injuries to bilateral elbows.  Wt appears fairly stable per review of chart. Pt was 80.7 kg on admission. Weights fluctuated up to 82.5 kg. Currently documented to be 80.7 kg.  Enteral Access: 10 Fr. Cortrak tube placed 11/22; 70 cm at left nare secured with bridle; terminates in proximal duodenum per abdominal x-ray 11/24 Of note, tube had previously been documented to be in stomach on abdominal x-ray 11/22 so it is now post-pyloric  TF regimen: tube feeds not infusing at time of RD assessment as pt had just transferred from 4N to 3W  UOP: 2075 mL (1.1 mL/kg/hr)  I/O: +2878.1 mL since admission  Medications reviewed and include: Novolog 0-9 units Q4hrs, multivitamin daily per tube, pantoprazole, Miralax, potassium chloride 40 mEq daily per tube, Xarelto, valproic acid, ceftriaxone Noted pt no longer receiving IV fluids  Labs reviewed: CBG 84-125, Creatinine 0.54  Spoke with pharmacy over the phone due to recent shortage of Jevity 1.5 liter bottles. They report now having adequate supply of liter bottles and are sending some up to floor. Discussed with RN.  Diet Order:   Diet Order             Diet NPO time  specified  Diet effective now                  EDUCATION NEEDS:   Education needs have been addressed (discussed NGT/cortrak and nutrition w/ wife)  Skin:  Skin Assessment: Skin Integrity Issues: Skin Integrity Issues:: Stage II Stage II: bilateral elbows  Last BM:  12/21/21 (no stool characteristics available)  Height:   Ht Readings from Last 1 Encounters:   12/08/21 _0  (1.956 m)   Weight:   Wt Readings from Last 1 Encounters:  12/22/21 80.7 kg   BMI:  Body mass index is 21.1 kg/m.  Estimated Nutritional Needs:   Kcal:  5436-0677CHEK  Protein:  100-120 grams  Fluid:  >/= 2 L/day  Loanne Drilling, MS, RD, LDN, CNSC Pager number available on Amion

## 2021-12-22 NOTE — Progress Notes (Addendum)
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  They expressed he would not want to be resuscitated at this point.  They are considering treatment with [place] traditional medicine approaches and at this time are not sure they are fully ready to stop tube feeds, to potentially allow a trial of these alternative treatments  Recommendations: -Stop glucose checks, blood pressure checks -CODE STATUS updated to DNR, anticipate transition to comfort care versus discharge to pursue alternative medicine treatments -Palliative care consult, family has requested time to process the diagnosis and further discussion tomorrow -Please do not hesitate to reach out to neurology if further questions or concerns arise, at this point family indicated no further questions  Lesleigh Noe MD-PhD [facility] [phone]   Greater than 50 minutes were spent in care of this patient today, the majority at bedside in discussion with family

## 2021-12-23 DIAGNOSIS — A8100 Creutzfeldt-Jakob disease, unspecified: Secondary | ICD-10-CM | POA: Diagnosis not present

## 2021-12-23 DIAGNOSIS — R451 Restlessness and agitation: Secondary | ICD-10-CM | POA: Diagnosis not present

## 2021-12-23 DIAGNOSIS — Z515 Encounter for palliative care: Secondary | ICD-10-CM | POA: Diagnosis not present

## 2021-12-23 DIAGNOSIS — Z66 Do not resuscitate: Secondary | ICD-10-CM

## 2021-12-23 DIAGNOSIS — G9340 Encephalopathy, unspecified: Secondary | ICD-10-CM | POA: Diagnosis not present

## 2021-12-23 DIAGNOSIS — R1312 Dysphagia, oropharyngeal phase: Secondary | ICD-10-CM | POA: Diagnosis not present

## 2021-12-23 LAB — MISC LABCORP TEST (SEND OUT): Labcorp test code: 9985

## 2021-12-23 LAB — BETA ISOFORM (CREUTZFELDT-JAKOB DISEASE)

## 2021-12-23 MED ORDER — MORPHINE SULFATE (CONCENTRATE) 10 MG/0.5ML PO SOLN: 5.0000 mg | ORAL | Status: DC | PRN

## 2021-12-23 NOTE — TOC Initial Note (Signed)
Transition of Care [facility]) - Initial/Assessment Note    Patient Details  Name: Spencer Ross MRN: [id number] Date of Birth: 02-06-1950  Transition of Care [facility]) CM/SW Contact:    Pollie Friar, RN Phone Number: 12/23/2021, 1:13 PM  Clinical Narrative:                 CM received referral for home hospice services. CM spoke to the patients spouse and she prefers to use [facility] for home hospice. Cm has sent the referral to [facility] with [facility].  Spouse would prefer hospice assess the home before ordering hospital bed, etc. They have an adjustable bed and she hopes he can use this instead. Shanita made aware.  TOC following.   Expected Discharge Plan: Home w Hospice Care Barriers to Discharge: Continued Medical Work up   Patient Goals and CMS Choice   CMS Medicare.gov Compare Post Acute Care list provided to:: Patient Represenative (must comment) Choice offered to / list presented to : Spouse  Expected Discharge Plan and Services Expected Discharge Plan: [place]   Discharge Planning Services: CM Consult Post Acute Care Choice: Hospice Living arrangements for the past 2 months: Single Family Home                                      Prior Living Arrangements/Services Living arrangements for the past 2 months: Single Family Home Lives with:: Spouse          Need for Family Participation in Patient Care: Yes (Comment) Care giver support system in place?: Yes (comment)      Activities of Daily Living      Permission Sought/Granted                  Emotional Assessment Appearance:: Appears stated age         Psych Involvement: No (comment)  Admission diagnosis:  Encephalitis [G04.90] Acute encephalopathy [G93.40] Right maxillary sinusitis [J32.0] Encephalopathy due to 2019 novel coronavirus [U07.1, G93.49] Patient Active Problem List   Diagnosis Date Noted   Protein-calorie malnutrition, severe 12/17/2021   BPH (benign  prostatic hyperplasia) 12/06/2021   Acute encephalopathy 12/06/2021   Unilateral primary osteoarthritis, right knee 07/08/2021   Allergic rhinitis due to animal (cat) (dog) hair and dander 01/02/2021   Allergic rhinitis due to pollen 01/02/2021   Chronic allergic conjunctivitis 01/02/2021   Wheezing 01/02/2021   Bilateral high frequency sensorineural hearing loss 12/01/2018   Subjective tinnitus of left ear 12/01/2018   Allergic rhinitis 11/10/2013   GERD (gastroesophageal reflux disease) 11/10/2013   Antiphospholipid syndrome ([place]) 10/07/2013   Pulmonary emboli ([place]) 03/30/2013   Pleuritic chest pain 03/30/2013   Cough, persistent 03/30/2013   FHx: factor V Leiden mutation 03/30/2013   PCP:  Lujean Amel, MD Pharmacy:   [facility] [place], [place] [address] [place] [address] Phone: [phone] Fax: [place] [id number] - [place], [place] [address] [place] [address] Phone: [phone] Fax: [phone]  [id number] - [place], [place] - [address] [place] [address] Phone: [phone] Fax: [phone]  [facility] [place], [place] [address] AT [facility] [address] [place] [address] [place] [address] Phone: [phone] Fax: [phone]     Social Determinants of Health (SDOH) Interventions    Readmission Risk Interventions     No data to display

## 2021-12-23 NOTE — Plan of Care (Signed)
  Problem: Safety: Goal: Non-violent Restraint(s) Outcome: Progressing   

## 2021-12-23 NOTE — Progress Notes (Signed)
Manufacturing engineer [facility]) Hospital Liaison Note  Referral received for hospice at home. [facility] liaison spoke with patient's wife Sheryl to confirm interest. Interest confirmed.   Hospice eligibility pending.   Plan is to discharge tomorrow via ambulance.   DME needs: admission nurse will assess on admission visit  Please send comfort medications/prescriptions home at discharge.   Please call with any questions or concerns. Thank you  Roselee Nova, [place] Hospital Liaison [phone]

## 2021-12-23 NOTE — Consult Note (Signed)
Consultation Note Date: 12/23/2021   Patient Name: Spencer Ross  DOB: Jun 11, 1950  MRN: [id number]  Age / Sex: 71 y.o., male  PCP: Spencer Amel, MD Referring Physician: Bonnielee Haff, MD  Reason for Consultation: Establishing goals of care and Psychosocial/spiritual support  HPI/Patient Profile: 71 y.o. male  admitted on 12/05/2021 PMH  significant for PE, APLA on Xarelto, BPH, GERD, and HLD.  Recent course significant for COVID-19 diagnosis 3 weeks prior to presentation.He presented to [facility] ED 11/11 with complaints of progressive confusion and behavioral changes.   He was admitted to the hospitalist service with what was thought to be COVID related encephalopathy. Neurology was consulted and presented a differential of autoimmune encephalitis, viral encephalitis, and Creutzfeldt-Jakob disease.    Experts at tertiary care center were consulted and suggested treatment with steroids and plasma exchange. PLEX was started 11/15.   Unfortunately he did not respond to treatment in any meaningful way.   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   Clinical Assessment and Goals of Care:   This NP Spencer Ross reviewed medical records, received report from team, assessed the patient and then meet at the patient's bedside along with his wife and 2 sons to discuss diagnosis, prognosis, GOC, EOL wishes disposition and options.   Concept of Palliative Care was introduced as specialized medical care for people and their families living with serious illness.  If focuses on providing relief from the symptoms and stress of a serious illness.  The goal is to improve quality of life for both the patient and the family.  Values and goals of care important to patient and family were attempted to be elicited.  Created space and  opportunity for family to explore thoughts and feelings regarding current medical situation.  All present verbalize an understanding of the seriousness of the patient's diagnosis and the limited prognosis.  All verbalize focus of care should be on comfort and dignity for Spencer Ross, aligning with patient's documented living well. Family faxed a copy of the documents, copy placed in your chart,   A  discussion was had today regarding advanced directives.  Concepts specific to code status, artifical feeding and hydration, continued IV antibiotics and rehospitalization was had.  MOST form introduced  The difference between an aggressive medical intervention path  and a palliative comfort care path for this patient at this time was had.     All family members agree that plan should be for Spencer Ross to be transition to home with hospice services, focus on comfort allowing for a natural death.     After detailed education and conversation all agree that on discharge there will be no further artificial feeding or hydration understanding that is in alignment with patient's documented wishes.  Son/ Spencer Ross verbalizes his hope for attempts at alternative [place] medicine/herbs and acupuncture, once patient is discharged home , other families support these efforts if it doesn't  impact patient's comfort  and dignity.    Education offered on hospice benefit; philosophy and eligibility.  Detailed the difference between home hospice and residential hospice.  Education offered on utilization of artificial feeding and hydration at end-of-life and its effect  on the natural trajectory of death itself and possible negative  impact on symptoms.   Communication with local funeral home, gathering information for  family regarding post-mortem care.    Natural trajectory and expectations at EOL were discussed.  Questions and concerns addressed.  Patient  encouraged to call with questions or concerns.     PMT will  continue to support holistically.       Patient's wife is documented H POA   SUMMARY OF RECOMMENDATIONS    Code Status/Advance Care Planning: DNR-comfort and dignity are focus of care No further diagnostics Continue antibiotics and tube feeds until discharge Leave core-trak until prior to discharge Continue with Foley catheter on discharge home Symptom management   Home with hospice on Thursday afternoon, order for choice is written   PMT will follow up in the morning    Symptom Management:  Dyspnea/pain: Roxanol 5 mg p.o./sublingual every 2 hours as needed  Palliative Prophylaxis:  Aspiration, Bowel Regimen, Frequent Pain Assessment, and Oral Care  Additional Recommendations (Limitations, Scope, Preferences): Full Comfort Care  Psycho-social/Spiritual:  Desire for further Chaplaincy support:no-.  Declined family have community support Additional Recommendations: Education on Hospice  Prognosis:  < 2 weeks  Discharge Planning:  Plan is home with hospice on Thursday afternoon, daughter is coming back to town on Wednesday night, [place] is [place] of the [place]    Home with Hospice      Primary Diagnoses: Present on Admission:  Pulmonary emboli (HCC)  GERD (gastroesophageal reflux disease)  Acute encephalopathy  Antiphospholipid syndrome ([place])   I have reviewed the medical record, interviewed the patient and family, and examined the patient. The following aspects are pertinent.  Past Medical History:  Diagnosis Date   Hypercholesteremia    Pulmonary embolism ([place])    Social History   Socioeconomic History   Marital status: Married    Spouse name: Not on file   Number of children: Not on file   Years of education: Not on file   Highest education level: Not on file  Occupational History   Not on file  Tobacco Use   Smoking status: Never   Smokeless tobacco: Never  Vaping Use   Vaping Use: Never used  Substance and Sexual Activity   Alcohol use: No    Drug use: No   Sexual activity: Not on file  Other Topics Concern   Not on file  Social History Narrative   Not on file   Social Determinants of Health   Financial Resource Strain: Not on file  Food Insecurity: Not on file  Transportation Needs: Not on file  Physical Activity: Not on file  Stress: Not on file  Social Connections: Not on file   Family History  Problem Relation Age of Onset   Heart disease Father    Asthma Paternal Grandfather    Scheduled Meds:  Chlorhexidine Gluconate Cloth  6 each Topical Daily   feeding supplement (PROSource TF20)  60 mL Per Tube Daily   free water  160 mL Per Tube Q4H   midazolam  2 mg Intravenous Once   multivitamin with minerals  1 tablet Per Tube Daily   nutrition supplement (JUVEN)  1 packet Per Tube BID BM   mouth rinse  15 mL Mouth Rinse  4 times per day   pantoprazole (PROTONIX) IV  40 mg Intravenous Q24H   polyethylene glycol  17 g Per Tube Daily   potassium chloride  40 mEq Per Tube Daily   rivaroxaban  20 mg Per Tube Q supper   sodium chloride flush  10-40 mL Intracatheter Q12H   sodium chloride flush  3 mL Intravenous Q12H   valproic acid  500 mg Per Tube BID   Continuous Infusions:  sodium chloride Stopped (12/19/21 0531)   cefTRIAXone (ROCEPHIN)  IV 1 g (12/22/21 1736)   feeding supplement (JEVITY 1.5 CAL/FIBER) 1,000 mL (12/23/21 0835)   PRN Meds:.sodium chloride, acetaminophen, atropine, diphenhydrAMINE, diphenhydrAMINE, LORazepam, LORazepam, mouth rinse, sodium chloride flush, sodium chloride flush Medications Prior to Admission:  Prior to Admission medications   Medication Sig Start Date End Date Taking? Authorizing Provider  ascorbic acid (VITAMIN C) 500 MG tablet Take 1,000 mg by mouth daily.   Yes [provider]  bismuth subsalicylate (PEPTO BISMOL) 262 MG/15ML suspension Take 30 mLs by mouth every 6 (six) hours as needed for diarrhea or loose stools.   Yes [provider]  busPIRone (BUSPAR)  7.5 MG tablet Take 15 mg by mouth 2 (two) times daily as needed (anxiety). 10/13/21  Yes [provider]  Calcium-Magnesium-Vitamin D (CALCIUM MAGNESIUM PO) Take 5 mLs by mouth daily. Oral liquid   Yes [provider]  Cholecalciferol (VITAMIN D3) 125 MCG (5000 UT) TABS Take 1 tablet by mouth daily.   Yes [provider]  IVERMECTIN PO Take 18 mg by mouth daily. Ivermectin oil '10mg'$ /ml suspension.   Yes [provider]  predniSONE (DELTASONE) 5 MG tablet Take 5-15 mg by mouth See admin instructions. Take 15 mg for 5 days, then take 10 mg for 5 days, and then take 5 mg for 5 days and then stop. 12/02/21  Yes [provider]  Rivaroxaban (XARELTO) 20 MG TABS tablet Take 1 tablet (20 mg total) by mouth daily with supper. Start taking March 29th, 2015 Patient taking differently: Take 20 mg by mouth daily with supper. 04/23/13  Yes Theodis Blaze, MD  traZODone (DESYREL) 50 MG tablet Take 25 mg by mouth at bedtime as needed for sleep.   Yes [provider]  zinc gluconate 50 MG tablet Take 50 mg by mouth daily.   Yes [provider]  benzonatate (TESSALON) 200 MG capsule Take 200 mg by mouth 3 (three) times daily as needed. Patient not taking: Reported on 12/06/2021 11/21/21   [provider]  dexamethasone (DECADRON) 6 MG tablet Take 6 mg by mouth once. Patient not taking: Reported on 12/06/2021 11/15/21   [provider]  Multiple Vitamin (MULTIVITAMIN WITH MINERALS) TABS tablet Take 1 tablet by mouth daily. Patient not taking: Reported on 12/06/2021    [provider]  omeprazole (PRILOSEC) 20 MG capsule Take 20 mg by mouth daily. Patient not taking: Reported on 12/06/2021    [provider]  saw palmetto 160 MG capsule Take 160 mg by mouth daily. Patient not taking: Reported on 12/06/2021    [provider]   Allergies  Allergen Reactions   Diphenhydramine     Other reaction(s): prostate  swelling, increase urination   Penicillins Other (See Comments)    "childhood reaction from mother". Per patient's wife, the patient was never sure about this allergy. The patient has taken amoxicillin previously without issue.   Trazodone Hcl     Other reaction(s): weak stream   Review of  Systems  Unable to perform ROS: Mental status change    Physical Exam Constitutional:      Appearance: He is underweight. He is ill-appearing.  Cardiovascular:     Rate and Rhythm: Normal rate.  Pulmonary:     Effort: Pulmonary effort is normal.  Skin:    General: Skin is warm and dry.  Neurological:     Mental Status: He is lethargic and disoriented.  Psychiatric:        Behavior: Behavior is agitated.        Cognition and Memory: Cognition is impaired.     Vital Signs: BP 102/72 (BP Location: Left Arm)   Pulse 92   Temp 99.5 F (37.5 C) (Axillary)   Resp 18   Ht '6\' 5"'$  (1.956 m)   Wt 85.5 kg   SpO2 95%   BMI 22.35 kg/m  Pain Scale: PAINAD   Pain Score: Asleep   SpO2: SpO2: 95 % O2 Device:SpO2: 95 % O2 Flow Rate: .O2 Flow Rate (L/min): 2 L/min  IO: Intake/output summary:  Intake/Output Summary (Last 24 hours) at 12/23/2021 0855 Last data filed at 12/22/2021 1200 Gross per 24 hour  Intake 230 ml  Output 600 ml  Net -370 ml    LBM: Last BM Date : 12/21/21 Baseline Weight: Weight: 80.7 kg Most recent weight: Weight: 85.5 kg     Palliative Assessment/Data:  30 % at best   Discussed with Dr  Maryland Pink and transition of care team and hospice liaison  Time In   0900 Time Out:  1100 Time Total: 120 minutes Greater than 50%  of this time was spent counseling and coordinating care related to the above assessment and plan.  Signed by: Spencer Lessen, NP   Please contact Palliative Medicine Team phone at [phone] for questions and concerns.  For individual provider: See Shea Evans

## 2021-12-23 NOTE — Progress Notes (Signed)
[facility] PROGRESS NOTE   THAD OSORIA MWN:[id number] DOB: 07-28-1950 DOA: 12/05/2021  PCP: Lujean Amel, MD  Brief History/Interval Summary: 71 year old male with a history of antiphospholipid antibody, pulmonary embolism in the past on chronic anticoagulation, recently had COVID-19 infection approximately 3 weeks PTA presented with progressive confusion over the past few weeks. MRI showed cortical restricted diffusion primarily in the right cerebral hemisphere, bilateral caudate heads and anterior putamina. He underwent lumbar puncture with several CSF studies in process. Infectious Meningitis/encephalitis panel noted to be negative.  He was started on high-dose IV steroids.  Overall mental status appears to be waxing and waning.  Nontunneled right IJ CVC placed by CCM 11/14.  Started alternate day plasma pheresis 11/15 through 11/24.  Patient became more obtunded and there was concern that he was not going to be able to protect his airway.  So he was transferred to the ICU on 11/24.  Transferred back out on 11/27.  Consultants: Neurology.  Critical care medicine.  Palliative care.  Procedures: Lumbar puncture.  Central line placement.    Subjective: Patient obtunded for the most part.  Noted to be in restraints.     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  Repeat MRI brain with and without contrast on 11/15 without changes compared to prior. -Continuous EEG did not show any epileptiform activity. -Lumbar puncture performed on 11/11.  -Completed 5-day course of high-dose Solu-Medrol 1 g daily on 11/15.  Also given treatments of plasmapheresis which ended on 11/24. Noted to be on Depakote. Patient was seen by neurology throughout this hospitalization.  Neurology also discussed with  providers at [facility]. 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 Patient with extremely poor prognosis.  CODE STATUS was changed over to DNR. Palliative care has been consulted.   Oropharyngeal dysphagia Remains on tube feedings at this time.  Acute kidney injury/hematuria/possible UTI/acute urinary retention Patient had urinary retention during this hospitalization requiring Foley catheter placement.  Renal function has been stable for the past several days.   There was also concern for urinary tract infection.  Patient remains on ceftriaxone.   Urine culture positive for Staphylococcus hemolyticus. Will not make any changes to treatment plan as palliative care will be speaking with family today and could be transitioning the patient to comfort care. Patient also had hematuria during this hospital stay.  Renal ultrasound did suggest clot in the bladder.  Appears to have subsided.  Hemoglobin did drop some but has been stable for the past several days.   Antiphospholipid antibody syndrome History of PE -On chronic anticoagulation with Xarelto.  This is being continued for now.  Did require heparin briefly when he had hematuria.   Asymptomatic sinus bradycardia: TSH normal.  On no rate control medications. Clinically stable  Electrolyte abnormalities Noted to have hypernatremia and hypokalemia.  Monitor closely for now.  Recent COVID-19 infection -First diagnosed approximately 3 weeks ago -He has been vaccinated -He was taking steroids as well as ivermectin prior to admission -Currently not hypoxic and hemodynamically stable.   Severe protein calorie malnutrition Nutrition Problem: Severe Malnutrition Etiology: acute illness Signs/Symptoms: mild fat depletion, moderate muscle depletion, energy intake < or equal to 50% for > or equal to 5 days  DVT Prophylaxis:  On Xarelto Code  Status: Full code Family Communication: No family at bedside Disposition Plan: To be determined  Status is: Inpatient Remains inpatient appropriate because: Acute encephalopathy      Medications: Scheduled:  Chlorhexidine Gluconate Cloth  6 each Topical Daily   feeding supplement (PROSource TF20)  60 mL Per Tube Daily   free water  160 mL Per Tube Q4H   midazolam  2 mg Intravenous Once   multivitamin with minerals  1 tablet Per Tube Daily   nutrition supplement (JUVEN)  1 packet Per Tube BID BM   mouth rinse  15 mL Mouth Rinse 4 times per day   pantoprazole (PROTONIX) IV  40 mg Intravenous Q24H   polyethylene glycol  17 g Per Tube Daily   potassium chloride  40 mEq Per Tube Daily   rivaroxaban  20 mg Per Tube Q supper   sodium chloride flush  10-40 mL Intracatheter Q12H   sodium chloride flush  3 mL Intravenous Q12H   valproic acid  500 mg Per Tube BID   Continuous:  sodium chloride Stopped (12/19/21 0531)   cefTRIAXone (ROCEPHIN)  IV 1 g (12/22/21 1736)   feeding supplement (JEVITY 1.5 CAL/FIBER) 1,000 mL (12/23/21 0835)   JJK:KXFGHW chloride, acetaminophen, atropine, diphenhydrAMINE, diphenhydrAMINE, LORazepam, LORazepam, mouth rinse, sodium chloride flush, sodium chloride flush  Antibiotics: Anti-infectives (From admission, onward)    Start     Dose/Rate Route Frequency Ordered Stop   12/20/21 1800  cefTRIAXone (ROCEPHIN) 1 g in sodium chloride 0.9 % 100 mL IVPB        1 g 200 mL/hr over 30 Minutes Intravenous Every 24 hours 12/20/21 1334 12/24/21 1759   12/19/21 1000  piperacillin-tazobactam (ZOSYN) IVPB 3.375 g  Status:  Discontinued        3.375 g 12.5 mL/hr over 240 Minutes Intravenous Every 8 hours 12/19/21 0339 12/20/21 1334   12/19/21 0430  piperacillin-tazobactam (ZOSYN) IVPB 3.375 g        3.375 g 100 mL/hr over 30 Minutes Intravenous  Once 12/19/21 0339 12/19/21 0700   12/18/21 1100  cefTRIAXone (ROCEPHIN) 1 g in sodium chloride 0.9 % 100  mL IVPB  Status:  Discontinued       Note to Pharmacy: Can you please see if patient has received cephalosporins previously? Dr. [place] Pink spoke to the patient's wife just now. She said he was never sure about this allergy and that he has taken amoxicillin previously without issue. MD gave verbal order to proceed with Ceftriaxone.   1 g 200 mL/hr over 30 Minutes Intravenous Every 24 hours 12/18/21 0939 12/19/21 0337   12/06/21 0700  acyclovir (ZOVIRAX) 800 mg in dextrose 5 % 250 mL IVPB  Status:  Discontinued        800 mg 266 mL/hr over 60 Minutes Intravenous Every 8 hours 12/06/21 0528 12/07/21 0527       Objective:  Vital Signs  Vitals:   12/22/21 2345 12/23/21 0343 12/23/21 0345 12/23/21 0824  BP: 130/89 115/72  102/72  Pulse: 97 91  92  Resp: '15 11  18  '$ Temp: 99.6 F (37.6 C) 99.5 F (37.5 C)  99.5 F (37.5 C)  TempSrc: Axillary Oral  Axillary  SpO2: 96% 97%  95%  Weight:   85.5 kg   Height:        Intake/Output Summary (Last 24 hours) at 12/23/2021 1022 Last data filed at 12/22/2021 1200 Gross per 24 hour  Intake 30 ml  Output 600 ml  Net -570 ml  Filed Weights   12/21/21 0436 12/22/21 0411 12/23/21 0345  Weight: 81.1 kg 80.7 kg 85.5 kg    General appearance: Obtunded for the most part. Nasogastric feeding tube was noted Resp: Clear to auscultation bilaterally.  Normal effort Cardio: S1-S2 is normal regular.  No S3-S4.  No rubs murmurs or bruit GI: Abdomen is soft.  Nontender nondistended.  Bowel sounds are present normal.  No masses organomegaly.  Foley catheter is noted Extremities: No edema.      Lab Results:  Data Reviewed: I have personally reviewed following labs and reports of the imaging studies  CBC: Recent Labs  Lab 12/19/21 0123 12/20/21 0545 12/20/21 1358 12/21/21 0440 12/22/21 0419  WBC 22.1* 15.1* 14.1* 9.7 8.2  HGB 11.5* 10.4* 10.8* 10.5* 10.4*  HCT 33.9* 32.1* 31.6* 30.9* 32.2*  MCV 90.6 93.6 89.5 92.2 94.4  PLT 173 140* 137*  142* 150     Basic Metabolic Panel: Recent Labs  Lab 12/19/21 2154 12/20/21 0545 12/20/21 1358 12/21/21 0440 12/22/21 0419  NA 142 142 145 144 139  K 3.2* 3.4* 3.0* 3.2* 3.5  CL 118* 117* 112* 108 105  CO2 21* 21* '24 27 25  '$ GLUCOSE 139* 124* 115* 131* 116*  BUN '14 12 11 10 11  '$ CREATININE 0.78 0.67 0.65 0.60* 0.54*  CALCIUM 7.8* 7.5* 7.9* 8.1* 7.7*  MG  --  2.0  --   --   --      GFR: Estimated Creatinine Clearance: 106.9 mL/min (A) (by C-G formula based on SCr of 0.54 mg/dL (L)).  Liver Function Tests: Recent Labs  Lab 12/19/21 0123  AST 22  ALT 13  ALKPHOS 20*  BILITOT 0.8  PROT 4.4*  ALBUMIN 2.8*    CBG: Recent Labs  Lab 12/21/21 2001 12/21/21 2344 12/22/21 0357 12/22/21 0727 12/22/21 1157  GLUCAP 84 120* 125* 119* 118*      Radiology Studies: No results found.     LOS: 17 days   Angelyne Terwilliger [employer] on [email or web address]  12/23/2021, 10:22 AM

## 2021-12-24 DIAGNOSIS — R52 Pain, unspecified: Secondary | ICD-10-CM | POA: Diagnosis not present

## 2021-12-24 DIAGNOSIS — Z515 Encounter for palliative care: Secondary | ICD-10-CM | POA: Diagnosis not present

## 2021-12-24 DIAGNOSIS — G9340 Encephalopathy, unspecified: Secondary | ICD-10-CM | POA: Diagnosis not present

## 2021-12-24 DIAGNOSIS — R451 Restlessness and agitation: Secondary | ICD-10-CM | POA: Diagnosis not present

## 2021-12-24 LAB — CULTURE, BLOOD (ROUTINE X 2)
Culture: NO GROWTH
Culture: NO GROWTH
Special Requests: ADEQUATE
Special Requests: ADEQUATE

## 2021-12-24 LAB — CBC
HCT: 31.3 % — ABNORMAL LOW (ref 39.0–52.0)
Hemoglobin: 10.2 g/dL — ABNORMAL LOW (ref 13.0–17.0)
MCH: 30.9 pg (ref 26.0–34.0)
MCHC: 32.6 g/dL (ref 30.0–36.0)
MCV: 94.8 fL (ref 80.0–100.0)
Platelets: 204 10*3/uL (ref 150–400)
RBC: 3.3 MIL/uL — ABNORMAL LOW (ref 4.22–5.81)
RDW: 15.5 % (ref 11.5–15.5)
WBC: 9.4 10*3/uL (ref 4.0–10.5)
nRBC: 0 % (ref 0.0–0.2)

## 2021-12-24 LAB — BASIC METABOLIC PANEL
Anion gap: 6 (ref 5–15)
BUN: 19 mg/dL (ref 8–23)
CO2: 26 mmol/L (ref 22–32)
Calcium: 7.8 mg/dL — ABNORMAL LOW (ref 8.9–10.3)
Chloride: 107 mmol/L (ref 98–111)
Creatinine, Ser: 0.6 mg/dL — ABNORMAL LOW (ref 0.61–1.24)
GFR, Estimated: >60 mL/min (ref >60)
Glucose, Bld: 121 mg/dL — ABNORMAL HIGH (ref 70–99)
Potassium: 4 mmol/L (ref 3.5–5.1)
Sodium: 139 mmol/L (ref 135–145)

## 2021-12-24 LAB — MAGNESIUM: Magnesium: 2 mg/dL (ref 1.7–2.4)

## 2021-12-24 MED ORDER — LORAZEPAM 1 MG PO TABS: 1.0000 mg | ORAL_TABLET | ORAL | Status: DC | PRN

## 2021-12-24 MED ORDER — MORPHINE SULFATE (CONCENTRATE) 10 MG/0.5ML PO SOLN: 5.0000 mg | ORAL | Status: DC | PRN

## 2021-12-24 NOTE — Progress Notes (Signed)
Patient ID: Spencer Ross, male   DOB: 12/11/1950, 71 y.o.   MRN: [id number]    Progress Note from the Palliative Medicine Team at [facility]   Patient Name: Spencer Ross        Date: 12/24/2021 DOB: 1950/12/16  Age: 71 y.o. MRN#: [id number] Attending Physician: Little Ishikawa, MD Primary Care Physician: Lujean Amel, MD Admit Date: 12/05/2021   Medical records reviewed, assessed the patient , discussed with treatment team    71 y.o. male  admitted on 12/05/2021 PMH  significant for PE, APLA on Xarelto, BPH, GERD, and HLD.  Recent course significant for COVID-19 diagnosis 3 weeks prior to presentation.He presented to [facility] ED 11/11 with complaints of progressive confusion and behavioral changes.    He was admitted to the hospitalist service with what was thought to be COVID related encephalopathy. Neurology was consulted and presented a differential of autoimmune encephalitis, viral encephalitis, and Creutzfeldt-Jakob disease.     Experts at tertiary care center were consulted and suggested treatment with steroids and plasma exchange. PLEX was started 11/15.    Unfortunately he did not respond to treatment in any meaningful way.    Neurology confirmed pathology to be positive for prion disease.  Family made decision to shift to full comfort path and transition patient  home with hospice     This NP assessed patient at the bedside as a follow up to  yesterday's GOCs meeting and for palliative medicine needs and emotional support.  No family at bedside  Patient appears comfortable.  Placed call to son/Spencer Ross and spoke to him regarding current medical situation and disposition plan.  Education offered on the natural trajectory and expectations at end-of-life.  Plan of care   Focus of care is comfort and dignity allowing for natural death  -DNR/DNI -Continue tube feeds and IV antibiotics until tomorrow morning.  Patient will be discharged home with hospice services.   At that time discontinue  core track, and then  no further artificial feeding or hydration -Symptom management      -Roxanol 5 mg sublingual every 1 hour as needed for pain or shortness of breath      -Ativan 1 mg p.o./4 hours as needed for agitation -Transition home with hospice tomorrow afternoon  Questions and concerns addressed   Discussed with Dr Avon Gully and [facility] team    Wadie Lessen NP  Palliative Medicine Team Team Phone # [phone] Pager [phone]

## 2021-12-24 NOTE — Progress Notes (Signed)
[facility] PROGRESS NOTE   CAPRICE WASKO YPP:[id number] DOB: 17-Sep-1950 DOA: 12/05/2021  PCP: Lujean Amel, MD  Brief History/Interval Summary: 71 year old male with a history of antiphospholipid antibody, pulmonary embolism in the past on chronic anticoagulation, recently had COVID-19 infection approximately 3 weeks PTA presented with progressive confusion over the past few weeks. MRI showed cortical restricted diffusion primarily in the right cerebral hemisphere, bilateral caudate heads and anterior putamina. He underwent lumbar puncture with several CSF studies in process. Infectious Meningitis/encephalitis panel noted to be negative.  He was started on high-dose IV steroids.  Overall mental status appears to be waxing and waning.  Nontunneled right IJ CVC placed by CCM 11/14.  Started alternate day plasma pheresis 11/15 through 11/24.  Patient became more obtunded and there was concern that he was not going to be able to protect his airway.  So he was transferred to the ICU on 11/24.  Transferred back out on 11/27.  Consultants: Neurology.  Critical care medicine.  Palliative care.  Procedures: Lumbar puncture.  Central line placement. NG placement.  Subjective: Patient obtunded for the most part.  Continues in restraints, nonreactive to verbal or tactile stimulus other than withdrawal to touch.   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  Repeat MRI brain with and without contrast on 11/15 without changes compared to prior. -Continuous EEG did not show any epileptiform activity. -Lumbar puncture performed on 11/11.  -Completed 5-day course of high-dose Solu-Medrol 1 g daily on 11/15.  Also given treatments of plasmapheresis which ended on 11/24. Noted to be on Depakote. Patient was  seen by neurology throughout this hospitalization.  Neurology also discussed with providers at [facility]. 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 Patient with extremely poor prognosis.  CODE STATUS was changed over to DNR. Palliative care has been consulted.   Oropharyngeal dysphagia Remains on tube feedings at this time.  Acute kidney injury/hematuria/possible UTI/acute urinary retention Patient had urinary retention during this hospitalization requiring Foley catheter placement.  Renal function has been stable for the past several days.   There was also concern for urinary tract infection.  Patient remains on ceftriaxone.   Urine culture positive for Staphylococcus hemolyticus. Will not make any changes to treatment plan as palliative care will be speaking with family today and could be transitioning the patient to comfort care. Patient also had hematuria during this hospital stay.  Renal ultrasound did suggest clot in the bladder.  Appears to have subsided.  Hemoglobin did drop some but has been stable for the past several days.   Antiphospholipid antibody syndrome History of PE -On chronic anticoagulation with Xarelto.  This is being continued for now.  Did require heparin briefly when he had hematuria.   Asymptomatic sinus bradycardia: TSH normal.  On no rate control medications. Clinically stable  Electrolyte abnormalities Noted to have hypernatremia and hypokalemia.  Monitor closely for now.  Recent COVID-19 infection -First diagnosed approximately 3 weeks ago -He has been vaccinated -He was taking steroids as well as ivermectin prior to admission -Currently not hypoxic and hemodynamically stable.   Severe protein calorie malnutrition Nutrition Problem: Severe Malnutrition Etiology: acute illness Signs/Symptoms: mild fat depletion, moderate muscle  depletion, energy intake < or equal to 50% for > or equal to 5  days  DVT Prophylaxis: On Xarelto Code Status: Full code Family Communication: No family at bedside Disposition Plan: To be determined  Status is: Inpatient Remains inpatient appropriate because: Acute encephalopathy  Medications: Scheduled:  Chlorhexidine Gluconate Cloth  6 each Topical Daily   feeding supplement (PROSource TF20)  60 mL Per Tube Daily   free water  160 mL Per Tube Q4H   multivitamin with minerals  1 tablet Per Tube Daily   nutrition supplement (JUVEN)  1 packet Per Tube BID BM   mouth rinse  15 mL Mouth Rinse 4 times per day   pantoprazole (PROTONIX) IV  40 mg Intravenous Q24H   polyethylene glycol  17 g Per Tube Daily   potassium chloride  40 mEq Per Tube Daily   rivaroxaban  20 mg Per Tube Q supper   sodium chloride flush  10-40 mL Intracatheter Q12H   sodium chloride flush  3 mL Intravenous Q12H   valproic acid  500 mg Per Tube BID   Continuous:  sodium chloride Stopped (12/19/21 0531)   feeding supplement (JEVITY 1.5 CAL/FIBER) 1,000 mL (12/24/21 0631)   RDE:YCXKGY chloride, acetaminophen, atropine, diphenhydrAMINE, diphenhydrAMINE, LORazepam, LORazepam, morphine CONCENTRATE, mouth rinse, sodium chloride flush, sodium chloride flush  Antibiotics: Anti-infectives (From admission, onward)    Start     Dose/Rate Route Frequency Ordered Stop   12/20/21 1800  cefTRIAXone (ROCEPHIN) 1 g in sodium chloride 0.9 % 100 mL IVPB        1 g 200 mL/hr over 30 Minutes Intravenous Every 24 hours 12/20/21 1334 12/23/21 1730   12/19/21 1000  piperacillin-tazobactam (ZOSYN) IVPB 3.375 g  Status:  Discontinued        3.375 g 12.5 mL/hr over 240 Minutes Intravenous Every 8 hours 12/19/21 0339 12/20/21 1334   12/19/21 0430  piperacillin-tazobactam (ZOSYN) IVPB 3.375 g        3.375 g 100 mL/hr over 30 Minutes Intravenous  Once 12/19/21 0339 12/19/21 0700   12/18/21 1100  cefTRIAXone (ROCEPHIN) 1 g in sodium  chloride 0.9 % 100 mL IVPB  Status:  Discontinued       Note to Pharmacy: Can you please see if patient has received cephalosporins previously? Dr. [place] Pink spoke to the patient's wife just now. She said he was never sure about this allergy and that he has taken amoxicillin previously without issue. MD gave verbal order to proceed with Ceftriaxone.   1 g 200 mL/hr over 30 Minutes Intravenous Every 24 hours 12/18/21 0939 12/19/21 0337   12/06/21 0700  acyclovir (ZOVIRAX) 800 mg in dextrose 5 % 250 mL IVPB  Status:  Discontinued        800 mg 266 mL/hr over 60 Minutes Intravenous Every 8 hours 12/06/21 0528 12/07/21 0527       Objective:  Vital Signs  Vitals:   12/23/21 1950 12/23/21 2331 12/24/21 0324 12/24/21 0430  BP: 113/74 112/75 (!) 140/81   Pulse: 96 94 93   Resp: '18 17 18   '$ Temp: 98.7 F (37.1 C) 98.4 F (36.9 C) 98.4 F (36.9 C)   TempSrc: Axillary Oral Oral   SpO2: 97% 97% 99%   Weight:    84.1 kg  Height:        Intake/Output Summary (Last 24 hours) at 12/24/2021 0752 Last data filed at 12/24/2021 0700 Gross per 24 hour  Intake --  Output 800 ml  Net -800 ml    Filed Weights   12/22/21 0411 12/23/21 0345 12/24/21 0430  Weight: 80.7  kg 85.5 kg 84.1 kg    General appearance: Obtunded for the most part. Nasogastric feeding tube was noted Resp: Clear to auscultation bilaterally.  Normal effort Cardio: S1-S2 is normal regular.  No S3-S4.  No rubs murmurs or bruit GI: Abdomen is soft.  Nontender nondistended.  Bowel sounds are present normal.  No masses organomegaly.  Foley catheter is noted Extremities: No edema.      Lab Results:  Data Reviewed: I have personally reviewed following labs and reports of the imaging studies  CBC: Recent Labs  Lab 12/20/21 0545 12/20/21 1358 12/21/21 0440 12/22/21 0419 12/24/21 0420  WBC 15.1* 14.1* 9.7 8.2 9.4  HGB 10.4* 10.8* 10.5* 10.4* 10.2*  HCT 32.1* 31.6* 30.9* 32.2* 31.3*  MCV 93.6 89.5 92.2 94.4 94.8  PLT  140* 137* 142* 150 204     Basic Metabolic Panel: Recent Labs  Lab 12/20/21 0545 12/20/21 1358 12/21/21 0440 12/22/21 0419 12/24/21 0420  NA 142 145 144 139 139  K 3.4* 3.0* 3.2* 3.5 4.0  CL 117* 112* 108 105 107  CO2 21* '24 27 25 26  '$ GLUCOSE 124* 115* 131* 116* 121*  BUN '12 11 10 11 19  '$ CREATININE 0.67 0.65 0.60* 0.54* 0.60*  CALCIUM 7.5* 7.9* 8.1* 7.7* 7.8*  MG 2.0  --   --   --  2.0     GFR: Estimated Creatinine Clearance: 105.1 mL/min (A) (by C-G formula based on SCr of 0.6 mg/dL (L)).  Liver Function Tests: Recent Labs  Lab 12/19/21 0123  AST 22  ALT 13  ALKPHOS 20*  BILITOT 0.8  PROT 4.4*  ALBUMIN 2.8*    CBG: Recent Labs  Lab 12/21/21 2001 12/21/21 2344 12/22/21 0357 12/22/21 0727 12/22/21 1157  GLUCAP 84 120* 125* 119* 118*    Radiology Studies: No results found.   LOS: 18 days   Little Ishikawa  [facility] Pager on [email or web address]  12/24/2021, 7:52 AM

## 2021-12-24 NOTE — TOC Progression Note (Signed)
Transition of Care [facility]) - Progression Note    Patient Details  Name: Spencer Ross MRN: [id number] Date of Birth: 29-Dec-1950  [facility] CM/SW Contact  Pollie Friar, RN Phone Number: 12/24/2021, 2:53 PM  Clinical Narrative:    Plan is for home with hospice services tomorrow. [facility] aware.  TOC following.   Expected Discharge Plan: Home w Hospice Care Barriers to Discharge: Continued Medical Work up  Expected Discharge Plan and Services Expected Discharge Plan: [place]   Discharge Planning Services: CM Consult Post Acute Care Choice: Hospice Living arrangements for the past 2 months: Single Family Home                                       Social Determinants of Health (SDOH) Interventions    Readmission Risk Interventions     No data to display

## 2021-12-24 NOTE — Plan of Care (Signed)
  Problem: Safety: Goal: Non-violent Restraint(s) Outcome: Progressing   Problem: Education: Goal: Knowledge of General Education information will improve Description: Including pain rating scale, medication(s)/side effects and non-pharmacologic comfort measures Outcome: Not Progressing   Problem: Health Behavior/Discharge Planning: Goal: Ability to manage health-related needs will improve Outcome: Not Progressing

## 2021-12-25 ENCOUNTER — Other Ambulatory Visit (HOSPITAL_COMMUNITY): Payer: Self-pay

## 2021-12-25 DIAGNOSIS — G9340 Encephalopathy, unspecified: Secondary | ICD-10-CM | POA: Diagnosis not present

## 2021-12-25 LAB — MISC LABCORP TEST (SEND OUT)
Labcorp test code: 9985
Labcorp test code: 9985

## 2021-12-25 MED ORDER — MORPHINE SULFATE (CONCENTRATE) 10 MG/0.5ML PO SOLN
5.0000 mg | ORAL | 0 refills | Status: AC | PRN
  Filled 2021-12-25: qty 180, 30d supply, fill #0

## 2021-12-25 MED ORDER — MORPHINE SULFATE (CONCENTRATE) 10 MG/0.5ML PO SOLN
5.0000 mg | ORAL | 0 refills | Status: DC | PRN
  Filled 2021-12-25: qty 180, 30d supply, fill #0

## 2021-12-25 NOTE — Discharge Summary (Signed)
Physician Discharge Summary  Spencer Ross MWU:[id number] DOB: 03/24/1950 DOA: 12/05/2021  PCP: Lujean Amel, MD  Admit date: 12/05/2021 Discharge date: 12/25/2021  Admitted From: Home Disposition:  Home with hospice  Recommendations for Outpatient Follow-up:  Follow up with home hospice as scheduled  Discharge Condition:Grim  CODE STATUS:DNR  Diet recommendation: As tolerated    Brief/Interim Summary: 71 year old male with a history of antiphospholipid antibody, pulmonary embolism in the past on chronic anticoagulation, recently had COVID-19 infection approximately 3 weeks PTA presented with progressive confusion over the past few weeks. MRI showed cortical restricted diffusion primarily in the right cerebral hemisphere, bilateral caudate heads and anterior putamina. He underwent lumbar puncture with several CSF studies in process. Infectious Meningitis/encephalitis panel noted to be negative.  He was started on high-dose IV steroids.  Overall mental status appears to be waxing and waning.  Nontunneled right IJ CVC placed by CCM 11/14.  Started alternate day plasma pheresis 11/15 through 11/24.  Patient became more obtunded and there was concern that he was not going to be able to protect his airway.  So he was transferred to the ICU on 11/24.  Transferred back out on 11/27.  Patient's mental status unfortunately did not recover despite treatment for multiple possible diagnoses as below, given imaging, EEG, LP, cultures treatment with high-dose steroids as well as IVIG patient was consulted to [facility] neurology for further evaluation and there is concern for prion disease as below.  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  Family discussion daily about patient's prognosis, transitioning to palliative care at home with hospice on 12/25/2021 per family wishes given patient's ongoing worsening mental status despite aggressive care as outlined  below.  At this time patient is otherwise stable family is agreeable to discharge patient home with hospice as outlined.  Medication changes as below with the addition of oral morphine for comfort, defer to outpatient hospice for further evaluation and treatment.  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  Repeat MRI brain with and without contrast on 11/15 without changes compared to prior. -Continuous EEG did not show any epileptiform activity. -Lumbar puncture performed on 11/11.  -Completed 5-day course of high-dose Solu-Medrol 1 g daily on 11/15.  Also given treatments of plasmapheresis which ended on 11/24. Noted to be on Depakote. Patient was seen by neurology throughout this hospitalization.  Neurology also discussed with providers at [facility]. 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 Patient with extremely poor prognosis.  CODE STATUS was changed over to DNR. Palliative care has been consulted, transitioning home with hospice as above   Oropharyngeal dysphagia NG tube removed at discharge, comfort feedings at this point moving forward   Acute kidney injury/hematuria/possible UTI/acute urinary retention Patient had urinary retention during this hospitalization requiring Foley catheter placement.  Renal function has been stable for the past several days.   There was also concern for urinary tract infection.  Completed ceftriaxone.    Urine culture positive for Staphylococcus hemolyticus.   Antiphospholipid antibody syndrome History of PE -On chronic  anticoagulation with Xarelto.  This will be discontinued at discharge given no safe oral route and patient is transition to hospice  Asymptomatic sinus bradycardia: TSH normal.   Electrolyte abnormalities Noted to have hypernatremia and hypokalemia. No longer following   Recent COVID-19 infection -First diagnosed approximately 3 weeks ago -He has been vaccinated -He was taking steroids as well as ivermectin prior to admission -Currently not hypoxic and hemodynamically stable.   Severe protein calorie malnutrition Nutrition Problem: Severe Malnutrition Etiology: acute illness Signs/Symptoms: mild fat depletion, moderate muscle depletion, energy intake < or equal to 50% for > or equal to 5 days   Discharge Instructions   Allergies as of 12/25/2021       Reactions   Diphenhydramine    Other reaction(s): prostate swelling, increase urination   Penicillins Other (See Comments)   "childhood reaction from mother". Per patient's wife, the patient was never sure about this allergy. The patient has taken amoxicillin previously without issue.   Trazodone Hcl    Other reaction(s): weak stream        Medication List     STOP taking these medications    ascorbic acid 500 MG tablet Commonly known as: VITAMIN C   benzonatate 200 MG capsule Commonly known as: TESSALON   bismuth subsalicylate 829 FA/21HY suspension Commonly known as: PEPTO BISMOL   busPIRone 7.5 MG tablet Commonly known as: BUSPAR   CALCIUM MAGNESIUM PO   dexamethasone 6 MG tablet Commonly known as: DECADRON   IVERMECTIN PO   multivitamin with minerals Tabs tablet   omeprazole 20 MG capsule Commonly known as: PRILOSEC   predniSONE 5 MG tablet Commonly known as: DELTASONE   rivaroxaban 20 MG Tabs tablet Commonly known as: Xarelto   saw palmetto 160 MG capsule   traZODone 50  MG tablet Commonly known as: DESYREL   Vitamin D3 125 MCG (5000 UT) Tabs   zinc gluconate 50 MG tablet       TAKE these medications    morphine CONCENTRATE 10 MG/0.5ML Soln concentrated solution Take 0.25 mLs (5 mg total) by mouth every hour as needed for moderate pain or shortness of breath.        Follow-up Information     Schedule an appointment as soon as possible for a visit  with Lujean Amel, MD.   Specialty: Family Medicine Why: As needed Contact information: [address] [place] [address] [phone]                Allergies  Allergen Reactions   Diphenhydramine     Other reaction(s): prostate swelling, increase urination   Penicillins Other (See Comments)    "childhood reaction from mother". Per patient's wife, the patient was never sure about this allergy. The patient has taken amoxicillin previously without issue.   Trazodone Hcl     Other reaction(s): weak stream    Consultations: Neuro   Procedures/Studies: [place] EKG SITE RITE  Result Date: 12/20/2021 If Site Rite image not attached, placement could not be confirmed due to current cardiac rhythm.  ECHOCARDIOGRAM COMPLETE  Result Date: 12/19/2021    ECHOCARDIOGRAM REPORT   Patient Name:   Spencer Ross Date of Exam: 12/19/2021 Medical Rec #:  [id number]       Height:       77.0 in Accession #:    [id number]      Weight:       180.3 lb Date of Birth:  August 02, 1950      BSA:  2.141 m Patient Age:    26 years        BP:           136/78 mmHg Patient Gender: M               HR:           77 bpm. Exam Location:  Inpatient Procedure: 2D Echo Indications:    murmur  History:        Patient has no prior history of Echocardiogram examinations.  Sonographer:    Harvie Junior Referring Phys: [place]  1. Left ventricular ejection fraction, by estimation, is 70 to 75%. The left ventricle has hyperdynamic function. The left ventricle has no regional wall  motion abnormalities. There is likely severe asymmetric left ventricular hypertrophy of the basal-septal segment. There is systoloci anterior motion of the mitral valve. Qualitatively evidence of LVOT Obstruction- cannot quantitative due to aliasing.  2. Right ventricular systolic function is normal. The right ventricular size is mildly enlarged. There is normal pulmonary artery systolic pressure. The estimated right ventricular systolic pressure is 34.1 mmHg.  3. The mitral valve is abnormal There appears to be anterior valve prolapse.. Moderate to severe mitral valve regurgitation.  4. The aortic valve is tricuspid. There is mild calcification of the aortic valve. There is moderate thickening of the aortic valve. Aortic valve mean gradient measures 32.0 mmHg. Valve opens well. Hyperdynamic function contributes to increased valve gradients.  5. Aortic dilatation noted. There is mild dilatation of the ascending aorta, measuring 41 mm. Comparison(s): No prior Echocardiogram. Conclusion(s)/Recommendation(s): TEE may be considered for further valve characterization. FINDINGS  Left Ventricle: Left ventricular ejection fraction, by estimation, is 70 to 75%. The left ventricle has hyperdynamic function. The left ventricle has no regional wall motion abnormalities. The left ventricular internal cavity size was normal in size. There is severe asymmetric left ventricular hypertrophy of the basal-septal segment. Left ventricular diastolic parameters are consistent with Grade I diastolic dysfunction (impaired relaxation). Right Ventricle: The right ventricular size is mildly enlarged. No increase in right ventricular wall thickness. Right ventricular systolic function is normal. There is normal pulmonary artery systolic pressure. The tricuspid regurgitant velocity is 2.78  m/s, and with an assumed right atrial pressure of 3 mmHg, the estimated right ventricular systolic pressure is 96.2 mmHg. Left Atrium: Left atrial size was  normal in size. Right Atrium: Right atrial size was normal in size. Pericardium: There is no evidence of pericardial effusion. Mitral Valve: The mitral valve is abnormal. Moderate to severe mitral valve regurgitation. Tricuspid Valve: The tricuspid valve is not well visualized. Tricuspid valve regurgitation is not demonstrated. No evidence of tricuspid stenosis. Aortic Valve: The aortic valve is tricuspid. There is mild calcification of the aortic valve. There is moderate thickening of the aortic valve. There is mild aortic valve annular calcification. Aortic valve regurgitation is not visualized. Aortic valve mean gradient measures 32.0 mmHg. Aortic valve peak gradient measures 89.1 mmHg. Pulmonic Valve: The pulmonic valve was normal in structure. Pulmonic valve regurgitation is not visualized. No evidence of pulmonic stenosis. Aorta: Aortic dilatation noted. There is mild dilatation of the ascending aorta, measuring 41 mm. IAS/Shunts: No atrial level shunt detected by color flow Doppler.  LEFT VENTRICLE PLAX 2D LVIDd:         4.50 cm      Diastology LVIDs:         2.30 cm      LV e' medial:    6.20 cm/s  LV PW:         0.90 cm      LV E/e' medial:  20.3 LV IVS:        0.90 cm      LV e' lateral:   11.00 cm/s LVOT diam:     2.30 cm      LV E/e' lateral: 11.5 LVOT Area:     4.15 cm  LV Volumes (MOD) LV vol d, MOD A2C: 118.0 ml LV vol d, MOD A4C: 136.0 ml LV vol s, MOD A2C: 48.2 ml LV vol s, MOD A4C: 50.6 ml LV SV MOD A2C:     69.8 ml LV SV MOD A4C:     136.0 ml LV SV MOD BP:      77.1 ml RIGHT VENTRICLE RV Basal diam:  4.40 cm RV Mid diam:    3.40 cm RV S prime:     21.10 cm/s TAPSE (M-mode): 1.9 cm LEFT ATRIUM             Index        RIGHT ATRIUM          Index LA diam:        3.70 cm 1.73 cm/m   RA Area:     8.81 cm LA Vol (A2C):   56.0 ml 26.16 ml/m  RA Volume:   16.30 ml 7.61 ml/m LA Vol (A4C):   62.5 ml 29.20 ml/m LA Biplane Vol: 60.9 ml 28.45 ml/m  AORTIC VALVE               PULMONIC VALVE AV Vmax:       472.00 cm/s  PV Vmax:       1.46 m/s AV Vmean:     305.000 cm/s PV Peak grad:  8.5 mmHg AV VTI:       0.832 m AV Peak Grad: 89.1 mmHg AV Mean Grad: 32.0 mmHg  AORTA Ao Root diam: 3.80 cm Ao Asc diam:  4.10 cm MITRAL VALVE                TRICUSPID VALVE MV Area (PHT): 4.49 cm     TR Peak grad:   30.9 mmHg MV Decel Time: 169 msec     TR Vmax:        278.00 cm/s MR Peak grad: 151.3 mmHg MR Vmax:      615.00 cm/s   SHUNTS MV E velocity: 126.00 cm/s  Systemic Diam: 2.30 cm MV A velocity: 72.60 cm/s MV E/A ratio:  1.74 Rudean Haskell MD Electronically signed by Rudean Haskell MD Signature Date/Time: 12/19/2021/12:55:07 PM    Final    DG Abd 1 View  Result Date: 12/19/2021 CLINICAL DATA:  71 year old male with sudden onset shortness of breath. Lethargy, encephalopathy. EXAM: ABDOMEN - 1 VIEW COMPARISON:  Abdominal radiograph 12/17/2021 and earlier. FINDINGS: Portable AP supine views at 0352 hours. Enteric feeding tube remains in place, tip at the level of the proximal duodenum in the right upper quadrant. Non obstructed bowel gas pattern. But there is evidence of a retained stool ball in the rectum. Other abdominal and pelvic visceral contours are within normal limits. No pneumoperitoneum identified on these supine views. No acute osseous abnormality identified. Right lung base appears negative. Increased left lung base opacity again noted. IMPRESSION: 1. Enteric feeding tube tip at the level of the proximal duodenum. 2. Non obstructed bowel gas pattern, although evidence of retained stool ball in the rectum. Consider fecal impaction. 3. Left lung base opacity as  described on the portable chest x-ray this morning. Electronically Signed   By: Genevie Ann M.D.   On: 12/19/2021 04:15   DG CHEST PORT 1 VIEW  Result Date: 12/19/2021 CLINICAL DATA:  71 year old male with sudden shortness of breath. EXAM: PORTABLE CHEST 1 VIEW COMPARISON:  Portable chest 12/09/2021 and earlier. FINDINGS: Portable AP semi  upright view at 0349 hours. Enteric feeding tube now in place, courses to the right upper quadrant tip not included. Stable right IJ dual lumen vascular catheter. New left lung base opacity and obscured left hemidiaphragm. No air bronchograms identified. No superimposed pneumothorax or pulmonary edema. Right lung appears negative. Stable cardiac size and mediastinal contours. No acute osseous abnormality identified. Negative visible bowel gas. IMPRESSION: 1. New left lung base opacity since 12/09/2021. Favor a small to moderate pleural effusion rather than atelectasis or pneumonia. 2. Enteric feeding tube extends into the small bowel, tip not included. Stable right IJ catheter. Electronically Signed   By: Genevie Ann M.D.   On: 12/19/2021 04:12   US RENAL  Result Date: 12/18/2021 CLINICAL DATA:  71 year old male with acute kidney injury. EXAM: RENAL / URINARY TRACT ULTRASOUND COMPLETE COMPARISON:  12/06/2021 CT FINDINGS: Right Kidney: Renal measurements: 13.3 x 7.3 x 7.1 cm = volume: 360 mL. New mild RIGHT hydronephrosis is identified. Mild cortical thinning is noted with normal renal echogenicity. There is no evidence of suspicious mass. Left Kidney: Renal measurements: 11.3 x 5.6 x 6.3 cm = volume: 209 ML. New mild LEFT hydronephrosis is identified. Mild cortical thinning is noted with normal renal echogenicity. There is no evidence of suspicious mass. Bladder: A 12.5 x 5 x 11.5 cm mixed echogenicity structure within the bladder demonstrates no internal vascular flow and likely represents hemorrhage/clot as this was not present on 12/06/2021 CT. Bladder distension is noted.  Bilateral ureteral jets are present. Other: None. IMPRESSION: 1. New 12.5 x 5 x 11.5 cm mixed echogenicity structure within the bladder compatible with hemorrhage/clot as this was not present on 12/06/2021 CT. New bladder distension and mild bilateral hydronephrosis. 2. Mild bilateral renal cortical thinning with normal renal echogenicity.  Electronically Signed   By: Margarette Canada M.D.   On: 12/18/2021 10:26   Overnight EEG with video  Result Date: 12/18/2021 Lora Havens, MD     12/19/2021  8:33 AM Patient Name: BURMAN BRUINGTON MRN: [id number] Epilepsy Attending: Lora Havens Referring Physician/Provider: Greta Doom, MD Duration: 12/17/2021 1648 to 12/18/2021 1648  Patient history: 71yo M with ams. EEG to evaluate for seizure  Level of alertness: Awake, asleep  AEDs during EEG study: VPA  Technical aspects: This EEG study was done with scalp electrodes positioned according to the 10-20 International system of electrode placement. Electrical activity was reviewed with band pass filter of 1-'70Hz'$ , sensitivity of 7 uV/mm, display speed of 83m/sec with a '60Hz'$  notched filter applied as appropriate. EEG data were recorded continuously and digitally stored.  Video monitoring was available and reviewed as appropriate.  Description: No clear posterior dominant rhythm was seen. Sleep was characterized by sleep spindles (12-'14hz'$ ), maximal fronto-central region. EEG also showed continuous generalized 3 to 7 Hz theta-delta slowing in left hemisphere and 2-'3Hz'$  delta slowing in right hemisphere. At times, lateralized periodic discharges were noted in right hemisphere, maximal right temporal region at 1-1.5 hz. Hyperventilation and photic stimulation were not performed.    ABNORMALITY - Lateralized periodic discharges , right hemisphere (LPD) - Continuous slow, generalized and lateralized right hemisphere  IMPRESSION: This study  showed evidence of epileptogenicity and cortical dysfunction arising from right hemisphere likely secondary to underlying structural abnormality. Additionally there is moderate diffuse encephalopathy, nonspecific etiology. No definite  seizures were seen throughout the recording.  Lora Havens   DG Abd Portable 1V  Result Date: 12/17/2021 CLINICAL DATA:  Encounter for feeding tube placement EXAM: PORTABLE  ABDOMEN - 1 VIEW COMPARISON:  CT 12/06/2021 FINDINGS: Feeding tube tip overlies the stomach. There is gaseous distention of bowel, predominantly colon, in a nonobstructive pattern. No acute osseous abnormality. IMPRESSION: Feeding tube tip overlies the stomach. Electronically Signed   By: Maurine Simmering M.D.   On: 12/17/2021 15:52   Overnight EEG with video  Result Date: 12/13/2021 Lora Havens, MD     12/13/2021 10:22 AM Patient Name: BRAELON SPRUNG MRN: [id number] Epilepsy Attending: Lora Havens Referring Physician/Provider: Lorenza Chick, MD Duration: 12/13/2021 0237 to 12/13/2021 1014  Patient history: 71yo M with ams. EEG to evaluate for seizure  Level of alertness: Awake, asleep  AEDs during EEG study: VPA  Technical aspects: This EEG study was done with scalp electrodes positioned according to the 10-20 International system of electrode placement. Electrical activity was reviewed with band pass filter of 1-'70Hz'$ , sensitivity of 7 uV/mm, display speed of 29m/sec with a '60Hz'$  notched filter applied as appropriate. EEG data were recorded continuously and digitally stored.  Video monitoring was available and reviewed as appropriate.  Description: No clear posterior dominant rhythm was seen. Sleep was characterized by sleep spindles (12-'14hz'$ ), maximal fronto-central region. EEG also showed continuous generalized 3 to 7 Hz theta-delta slowing in left hemisphere and 2-'3Hz'$  delta slowing in right hemisphere. Sharp waves were noted in right hemisphere, maximal right temporal region qasi-periodic at 1 hz. Hyperventilation and photic stimulation were not performed.    ABNORMALITY - Sharp waves,  right hemisphere - Continuous slow, generalized and lateralized right hemisphere  IMPRESSION: This study showed evidence of epileptogenicity and cortical dysfunction arising from right hemisphere. Additionally there is moderate diffuse encephalopathy, nonspecific etiology. No seizures were seen throughout the  recording.  PLora Havens   EEG adult  Result Date: 12/12/2021 YLora Havens MD     12/12/2021 11:27 AM Patient Name: JGREER KOEPPENMRN: [id number] Attending: PLora HavensReferring Physician/Provider: AAmie Portland MD Date: 12/11/2021 Duration: 22.37 mins Patient history: 89yoM with ams. EEG to evaluate for seizure Level of alertness: Awake AEDs during EEG study: VPA Technical aspects: This EEG study was done with scalp electrodes positioned according to the 10-20 International system of electrode placement. Electrical activity was reviewed with band pass filter of 1-'70Hz'$ , sensitivity of 7 uV/mm, display speed of 349msec with a '60Hz'$  notched filter applied as appropriate. EEG data were recorded continuously and digitally stored.  Video monitoring was available and reviewed as appropriate. Description: .EEG also showed continuous generalized and lateralized right hemisphere 3 to 6 Hz theta-delta slowing. Lateralized periodic discharges were noted in right hemisphere at '1hz'$ .  Hyperventilation and photic stimulation were not performed.   ABNORMALITY - Lateralized periodic discharges ( LPD), right hemisphere - Continuous slow, generalized and lateralized right hemisphere IMPRESSION: This study showed evidence of epileptogenicity and cortical dysfunction arising from right hemisphere likely due to underlying structural abnormality and increased risk of seizure recurrence. Additionally there is moderate diffuse encephalopathy, nonspecific etiology. No seizures were seen throughout the recording. EEG appears to be worsening compared to previous study [place]  Result Date: 12/10/2021 CLINICAL DATA:  Delirium EXAM: MRI HEAD WITHOUT AND WITH CONTRAST TECHNIQUE: Multiplanar, multiecho pulse sequences of the brain and surrounding structures were obtained without and with intravenous contrast. CONTRAST:  45m GADAVIST GADOBUTROL 1 MMOL/ML IV SOLN  COMPARISON:  12/05/2021 FINDINGS: Brain: Unchanged multifocal abnormal cortical diffusion restriction in the right hemisphere, most evident right parietal lobe. No acute or chronic hemorrhage. Minimal multifocal hyperintense T2-weight signal within the white matter. Mild diffusion restriction also noted within the corpora striata, unchanged. Vascular: Major flow voids are preserved. Skull and upper cervical spine: Normal calvarium and skull base. Visualized upper cervical spine and soft tissues are normal. Sinuses/Orbits:No paranasal sinus fluid levels or advanced mucosal thickening. No mastoid or middle ear effusion. Normal orbits. IMPRESSION: 1. Unchanged diffusion abnormalities of the right hemispheric cortex and the corpora striata. Infectious or inflammatory encephalitides remains of primary concern Electronically Signed   By: KUlyses JarredM.D.   On: 12/10/2021 23:18   DG CHEST PORT 1 VIEW  Result Date: 12/09/2021 CLINICAL DATA:  Dialysis catheter placement EXAM: PORTABLE CHEST 1 VIEW COMPARISON:  09/15/2013 FINDINGS: Single frontal view of the chest demonstrates right internal jugular dialysis catheter tip overlying superior vena cava. The cardiac silhouette is unremarkable. Lung volumes are diminished with patchy left basilar consolidation favoring atelectasis. No effusion or pneumothorax. No acute bony abnormalities. IMPRESSION: 1. No complication after right internal jugular dialysis catheter placement. 2. Low lung volumes, with patchy left basilar atelectasis. Electronically Signed   By: MRanda NgoM.D.   On: 12/09/2021 16:26   CT HEAD WO CONTRAST (5MM)  Result Date: 12/06/2021 CLINICAL DATA:  Neck trauma (Age >= 65y) unwitnessed fall. Patient sustained an unwitnessed fall. Patient was found on the ground by staff. Patient placed back in bed. Patient moving all extremities. MD notified. EXAM: CT HEAD WITHOUT CONTRAST CT CERVICAL SPINE WITHOUT CONTRAST TECHNIQUE: Multidetector CT imaging of  the head and cervical spine was performed following the standard protocol without intravenous contrast. Multiplanar CT image reconstructions of the cervical spine were also generated. RADIATION DOSE REDUCTION: This exam was performed according to the departmental dose-optimization program which includes automated exposure control, adjustment of the mA and/or kV according to patient size and/or use of iterative reconstruction technique. COMPARISON:  None Available. FINDINGS: CT HEAD FINDINGS Brain: No evidence of large-territorial acute infarction. No parenchymal hemorrhage. No mass lesion. No extra-axial collection. No mass effect or midline shift. No hydrocephalus. Basilar cisterns are patent. Vascular: No hyperdense vessel. Atherosclerotic calcifications are present within the cavernous internal carotid arteries. Skull: No acute fracture or focal lesion. Sinuses/Orbits: Paranasal sinuses and mastoid air cells are clear. The orbits are unremarkable. Other: None. CT CERVICAL SPINE FINDINGS Alignment: Normal. Skull base and vertebrae: No acute fracture. No aggressive appearing focal osseous lesion or focal pathologic process. Soft tissues and spinal canal: No prevertebral fluid or swelling. No visible canal hematoma. Upper chest: Unremarkable. Other: Atherosclerotic plaque of the carotid arteries within the neck. IMPRESSION: 1. No acute intracranial abnormality. 2. No acute displaced fracture or traumatic listhesis of the cervical spine. Electronically Signed   By: MIven FinnM.D.   On: 12/06/2021 19:30   CT CERVICAL SPINE WO CONTRAST  Result Date: 12/06/2021 CLINICAL DATA:  Neck trauma (Age >= 65y) unwitnessed fall. Patient sustained an unwitnessed fall. Patient was found on the ground by staff. Patient placed back in bed. Patient moving all extremities. MD notified. EXAM: CT HEAD WITHOUT CONTRAST CT CERVICAL SPINE WITHOUT CONTRAST TECHNIQUE: Multidetector CT imaging of the  head and cervical spine was  performed following the standard protocol without intravenous contrast. Multiplanar CT image reconstructions of the cervical spine were also generated. RADIATION DOSE REDUCTION: This exam was performed according to the departmental dose-optimization program which includes automated exposure control, adjustment of the mA and/or kV according to patient size and/or use of iterative reconstruction technique. COMPARISON:  None Available. FINDINGS: CT HEAD FINDINGS Brain: No evidence of large-territorial acute infarction. No parenchymal hemorrhage. No mass lesion. No extra-axial collection. No mass effect or midline shift. No hydrocephalus. Basilar cisterns are patent. Vascular: No hyperdense vessel. Atherosclerotic calcifications are present within the cavernous internal carotid arteries. Skull: No acute fracture or focal lesion. Sinuses/Orbits: Paranasal sinuses and mastoid air cells are clear. The orbits are unremarkable. Other: None. CT CERVICAL SPINE FINDINGS Alignment: Normal. Skull base and vertebrae: No acute fracture. No aggressive appearing focal osseous lesion or focal pathologic process. Soft tissues and spinal canal: No prevertebral fluid or swelling. No visible canal hematoma. Upper chest: Unremarkable. Other: Atherosclerotic plaque of the carotid arteries within the neck. IMPRESSION: 1. No acute intracranial abnormality. 2. No acute displaced fracture or traumatic listhesis of the cervical spine. Electronically Signed   By: Iven Finn M.D.   On: 12/06/2021 19:30   DG Pelvis Portable  Result Date: 12/06/2021 CLINICAL DATA:  Fall EXAM: PORTABLE PELVIS 1-2 VIEWS COMPARISON:  CT pelvis from 12/06/2021 FINDINGS: Contrast medium in the urinary bladder from recent CT scan. Chronic loss of intervertebral disc height at L5-S1. No fracture or acute bony findings identified. IMPRESSION: 1. No acute findings. 2. Chronic loss of intervertebral disc height at L5-S1. Electronically Signed   By: Van Clines M.D.   On: 12/06/2021 17:37   CT CHEST ABDOMEN PELVIS W CONTRAST  Result Date: 12/06/2021 CLINICAL DATA:  Prostate cancer. Altered mental status. Encephalitis. Evaluate for occult malignancy. EXAM: CT CHEST, ABDOMEN, AND PELVIS WITH CONTRAST TECHNIQUE: Multidetector CT imaging of the chest, abdomen and pelvis was performed following the standard protocol during bolus administration of intravenous contrast. RADIATION DOSE REDUCTION: This exam was performed according to the departmental dose-optimization program which includes automated exposure control, adjustment of the mA and/or kV according to patient size and/or use of iterative reconstruction technique. CONTRAST:  17m OMNIPAQUE IOHEXOL 350 MG/ML SOLN COMPARISON:  CT scan 04/10/2021 and MRI abdomen 05/14/2021 FINDINGS: CT CHEST FINDINGS Cardiovascular: The heart is normal in size. No pericardial effusion. The aorta is normal in caliber. No dissection or atherosclerotic calcification. No definite coronary artery calcifications. Mediastinum/Nodes: Small scattered mediastinal and hilar lymph nodes but no mass or adenopathy. The esophagus is unremarkable. The thyroid gland is unremarkable. Lungs/Pleura: No acute pulmonary process. No worrisome pulmonary lesions or pulmonary nodules. Patchy bibasilar dependent atelectasis. The central tracheobronchial tree is unremarkable. Musculoskeletal: No significant bony findings. CT ABDOMEN PELVIS FINDINGS Hepatobiliary: Stable small hemangioma in the right hepatic lobe in segment 7. No worrisome hepatic lesions or intrahepatic biliary dilatation. The gallbladder is unremarkable. No common bile duct dilatation. Pancreas: No mass, inflammation or ductal dilatation. Spleen: Normal size. No focal lesions. Adrenals/Urinary Tract: Adrenal glands and kidneys are unremarkable. Stable simple left renal cyst. No further imaging evaluation or follow-up is necessary. The bladder is normal. Stomach/Bowel: The stomach,  duodenum, small bowel and colon are unremarkable. No acute inflammatory process, mass lesion or obstructive findings. Stable scattered colonic diverticulosis. Vascular/Lymphatic: Scattered atherosclerotic calcifications involving the abdominal aorta and iliac arteries but no aneurysm or dissection. The major venous structures are patent. No mesenteric or retroperitoneal mass or  adenopathy. Reproductive: Mildly enlarged prostate gland with median lobe hypertrophy impressing on the base of the bladder. The seminal vesicles are unremarkable. Other: No pelvic mass or adenopathy. No free pelvic fluid collections. No inguinal mass or adenopathy. No abdominal wall hernia or subcutaneous lesions. Musculoskeletal: No significant bony findings. No lytic or sclerotic bone lesions. IMPRESSION: 1. No acute abdominal/pelvic findings, mass lesion or adenopathy. 2. Stable small right hepatic lobe hemangioma. 3. Stable simple left renal cyst. 4. Mildly enlarged prostate gland with median lobe hypertrophy impressing on the base of the bladder. Electronically Signed   By: Marijo Sanes M.D.   On: 12/06/2021 10:45   Overnight EEG with video  Result Date: 12/06/2021 Lora Havens, MD     12/07/2021  9:23 AM Patient Name: CORBITT CLOKE MRN: [id number] Epilepsy Attending: Lora Havens Referring Physician/Provider: Lorenza Chick, MD Duration: 12/06/2021 0526 to 12/07/2021 0526 Patient history: 71yo M with ams. EEG to evaluate for seizure Level of alertness: Awake, asleep AEDs during EEG study: None Technical aspects: This EEG study was done with scalp electrodes positioned according to the 10-20 International system of electrode placement. Electrical activity was reviewed with band pass filter of 1-'70Hz'$ , sensitivity of 7 uV/mm, display speed of 7m/sec with a '60Hz'$  notched filter applied as appropriate. EEG data were recorded continuously and digitally stored.  Video monitoring was available and reviewed as appropriate.  Description: The posterior dominant rhythm consists of 9 Hz activity of moderate voltage (25-35 uV) seen predominantly in posterior head regions, symmetric and reactive to eye opening and eye closing. Sleep was characterized by sleep spindles (12-'14hz'$ ), maximal fronto-central region.EEG also showed near continuous 3 to 6 Hz theta-delta slowing in right fronto-temporal region which at times appeared sharply contoured. Hyperventilation and photic stimulation were not performed.   ABNORMALITY - Continuous slow, right fronto-temporal region IMPRESSION: This study is suggestive of cortical dysfunction arising from right fronto-temporal region likely secondary to underlying structural abnormality, post-ictal state. No seizures or epileptiform discharges were seen throughout the recording. PLora Havens  MR BRAIN WO CONTRAST  Result Date: 12/05/2021 CLINICAL DATA:  Altered mental status, stroke suspected EXAM: MRI HEAD WITHOUT CONTRAST TECHNIQUE: Multiplanar, multiecho pulse sequences of the brain and surrounding structures were obtained without intravenous contrast. COMPARISON:  None Available. FINDINGS: Brain: Cortical restricted diffusion with ADC correlate in the right cerebral hemisphere (series 5, image 73, 85, and 90 for example). Possible involvement in the posterior left frontal lobe (series 7, image 53). These areas are associated with mildly increased T2 hyperintense signal but no cortical swelling. Mildly increased signal on diffusion-weighted imaging in the bilateral caudate heads (series 5, image 83) and, to a lesser extent, the anterior putamen. No acute hemorrhage, mass, mass effect, or midline shift. No hydrocephalus or extra-axial collection. Cerebral volume is within normal limits for age. Scattered T2 hyperintense signal in the periventricular white matter, likely the sequela of mild chronic small vessel ischemic disease. Vascular: Normal arterial flow voids. Skull and upper cervical spine:  Normal marrow signal. Sinuses/Orbits: Mucosal thickening in the ethmoid air cells. No acute finding in the orbits. Other: The mastoids are well aerated. IMPRESSION: Cortical restricted diffusion primarily in the right cerebral hemisphere, bilateral caudate heads, and anterior putamina, which are nonspecific but concerning for Creutzfeldt-Jakob disease. The differential includes other encephalopathies, such as autoimmune encephalitis or hepatic encephalopathy. A lumbar puncture is recommended. Electronically Signed   By: AMerilyn BabaM.D.   On: 12/05/2021 23:50   CT Head Wo Contrast  Result Date: 12/05/2021 CLINICAL DATA:  Altered mental status, recent COVID diagnosis. EXAM: CT HEAD WITHOUT CONTRAST TECHNIQUE: Contiguous axial images were obtained from the base of the skull through the vertex without intravenous contrast. RADIATION DOSE REDUCTION: This exam was performed according to the departmental dose-optimization program which includes automated exposure control, adjustment of the mA and/or kV according to patient size and/or use of iterative reconstruction technique. COMPARISON:  None Available. FINDINGS: Brain: The brainstem, cerebellum, cerebral peduncles, thalami, basal ganglia, basilar cisterns, and ventricular system appear within normal limits. No intracranial hemorrhage, mass lesion, or acute CVA. Vascular: There is atherosclerotic calcification of the cavernous carotid arteries bilaterally. Skull: Unremarkable Sinuses/Orbits: Acute right maxillary sinusitis with chronic bilateral ethmoid and left maxillary sinusitis and mild chronic left frontal sinusitis. Other: No supplemental non-categorized findings. IMPRESSION: 1. No acute intracranial findings. 2. Acute right maxillary sinusitis with chronic bilateral ethmoid and left maxillary sinusitis and mild chronic left frontal sinusitis. 3. Atherosclerosis. Electronically Signed   By: Van Clines M.D.   On: 12/05/2021 16:19     Subjective:  No acute issues/events overnight   Discharge Exam: Vitals:   12/24/21 0836 12/24/21 1946  BP: 111/64 127/80  Pulse: 89 92  Resp: 18 20  Temp: 98.9 F (37.2 C) 100.1 F (37.8 C)  SpO2: 97% 96%   Vitals:   12/24/21 0430 12/24/21 0836 12/24/21 1946 12/25/21 0500  BP:  111/64 127/80   Pulse:  89 92   Resp:  18 20   Temp:  98.9 F (37.2 C) 100.1 F (37.8 C)   TempSrc:  Oral Oral   SpO2:  97% 96%   Weight: 84.1 kg   87.9 kg  Height:        General: Pt is somnolent, not in acute distress Cardiovascular: RRR, S1/S2 +, no rubs, no gallops Respiratory: CTA bilaterally, no wheezing, no rhonchi Abdominal: Soft, NT, ND, bowel sounds + Extremities: no edema, no cyanosis    The results of significant diagnostics from this hospitalization (including imaging, microbiology, ancillary and laboratory) are listed below for reference.     Microbiology: Recent Results (from the past 240 hour(s))  Urine Culture     Status: Abnormal   Collection Time: 12/18/21  9:39 AM   Specimen: Urine, Catheterized  Result Value Ref Range Status   Specimen Description URINE, CATHETERIZED  Final   Special Requests   Final    NONE Performed at [facility], [address] [place], [place] [address]    Culture >=100,000 COLONIES/mL STAPHYLOCOCCUS HAEMOLYTICUS (A)  Final   Report Status 12/20/2021 FINAL  Final   Organism ID, Bacteria STAPHYLOCOCCUS HAEMOLYTICUS (A)  Final      Susceptibility   Staphylococcus haemolyticus - MIC*    CIPROFLOXACIN <=0.5 SENSITIVE Sensitive     GENTAMICIN <=0.5 SENSITIVE Sensitive     NITROFURANTOIN <=16 SENSITIVE Sensitive     OXACILLIN <=0.25 SENSITIVE Sensitive     TETRACYCLINE >=16 RESISTANT Resistant     VANCOMYCIN <=0.5 SENSITIVE Sensitive     TRIMETH/SULFA <=10 SENSITIVE Sensitive     CLINDAMYCIN RESISTANT Resistant     RIFAMPIN <=0.5 SENSITIVE Sensitive     Inducible Clindamycin POSITIVE Resistant     * >=100,000 COLONIES/mL STAPHYLOCOCCUS  HAEMOLYTICUS  Culture, blood (Routine X 2) w Reflex to ID Panel     Status: None   Collection Time: 12/19/21  6:30 AM   Specimen: BLOOD  Result Value Ref Range Status   Specimen Description BLOOD RIGHT ANTECUBITAL  Final  Special Requests   Final    BOTTLES DRAWN AEROBIC AND ANAEROBIC Blood Culture adequate volume   Culture   Final    NO GROWTH 5 DAYS Performed at [facility], [place] [address], [place], [place] [address]    Report Status 12/24/2021 FINAL  Final  Culture, blood (Routine X 2) w Reflex to ID Panel     Status: None   Collection Time: 12/19/21  6:30 AM   Specimen: BLOOD RIGHT ARM  Result Value Ref Range Status   Specimen Description BLOOD RIGHT ARM  Final   Special Requests   Final    BOTTLES DRAWN AEROBIC AND ANAEROBIC Blood Culture adequate volume   Culture   Final    NO GROWTH 5 DAYS Performed at [facility], [place] [address], [place], [place] [address]    Report Status 12/24/2021 FINAL  Final     Labs: BNP (last 3 results) No results for input(s): "BNP" in the last 8760 hours. Basic Metabolic Panel: Recent Labs  Lab 12/20/21 0545 12/20/21 1358 12/21/21 0440 12/22/21 0419 12/24/21 0420  NA 142 145 144 139 139  K 3.4* 3.0* 3.2* 3.5 4.0  CL 117* 112* 108 105 107  CO2 21* '24 27 25 26  '$ GLUCOSE 124* 115* 131* 116* 121*  BUN '12 11 10 11 19  '$ CREATININE 0.67 0.65 0.60* 0.54* 0.60*  CALCIUM 7.5* 7.9* 8.1* 7.7* 7.8*  MG 2.0  --   --   --  2.0   Liver Function Tests: Recent Labs  Lab 12/19/21 0123  AST 22  ALT 13  ALKPHOS 20*  BILITOT 0.8  PROT 4.4*  ALBUMIN 2.8*   No results for input(s): "LIPASE", "AMYLASE" in the last 168 hours. No results for input(s): "AMMONIA" in the last 168 hours. CBC: Recent Labs  Lab 12/20/21 0545 12/20/21 1358 12/21/21 0440 12/22/21 0419 12/24/21 0420  WBC 15.1* 14.1* 9.7 8.2 9.4  HGB 10.4* 10.8* 10.5* 10.4* 10.2*  HCT 32.1* 31.6* 30.9* 32.2* 31.3*  MCV 93.6 89.5 92.2 94.4 94.8  PLT 140* 137* 142* 150  204   Cardiac Enzymes: No results for input(s): "CKTOTAL", "CKMB", "CKMBINDEX", "TROPONINI" in the last 168 hours.  BNP: Invalid input(s): "POCBNP" CBG: Recent Labs  Lab 12/21/21 2001 12/21/21 2344 12/22/21 0357 12/22/21 0727 12/22/21 1157  GLUCAP 84 120* 125* 119* 118*   D-Dimer No results for input(s): "DDIMER" in the last 72 hours. Hgb A1c No results for input(s): "HGBA1C" in the last 72 hours. Lipid Profile No results for input(s): "CHOL", "HDL", "LDLCALC", "TRIG", "CHOLHDL", "LDLDIRECT" in the last 72 hours. Thyroid function studies No results for input(s): "TSH", "T4TOTAL", "T3FREE", "THYROIDAB" in the last 72 hours.  Invalid input(s): "FREET3" Anemia work up No results for input(s): "VITAMINB12", "FOLATE", "FERRITIN", "TIBC", "IRON", "RETICCTPCT" in the last 72 hours. Urinalysis    Component Value Date/Time   COLORURINE AMBER (A) 12/17/2021 1818   APPEARANCEUR CLOUDY (A) 12/17/2021 1818   LABSPEC >1.030 (H) 12/17/2021 1818   PHURINE 5.5 12/17/2021 1818   GLUCOSEU NEGATIVE 12/17/2021 1818   HGBUR LARGE (A) 12/17/2021 1818   BILIRUBINUR NEGATIVE 12/17/2021 1818   KETONESUR 15 (A) 12/17/2021 1818   PROTEINUR 30 (A) 12/17/2021 1818   NITRITE POSITIVE (A) 12/17/2021 1818   LEUKOCYTESUR TRACE (A) 12/17/2021 1818   Sepsis Labs Recent Labs  Lab 12/20/21 1358 12/21/21 0440 12/22/21 0419 12/24/21 0420  WBC 14.1* 9.7 8.2 9.4   Microbiology Recent Results (from the past 240 hour(s))  Urine Culture  Status: Abnormal   Collection Time: 12/18/21  9:39 AM   Specimen: Urine, Catheterized  Result Value Ref Range Status   Specimen Description URINE, CATHETERIZED  Final   Special Requests   Final    NONE Performed at [facility], [address] [place], [place] [address]    Culture >=100,000 COLONIES/mL STAPHYLOCOCCUS HAEMOLYTICUS (A)  Final   Report Status 12/20/2021 FINAL  Final   Organism ID, Bacteria STAPHYLOCOCCUS HAEMOLYTICUS (A)  Final       Susceptibility   Staphylococcus haemolyticus - MIC*    CIPROFLOXACIN <=0.5 SENSITIVE Sensitive     GENTAMICIN <=0.5 SENSITIVE Sensitive     NITROFURANTOIN <=16 SENSITIVE Sensitive     OXACILLIN <=0.25 SENSITIVE Sensitive     TETRACYCLINE >=16 RESISTANT Resistant     VANCOMYCIN <=0.5 SENSITIVE Sensitive     TRIMETH/SULFA <=10 SENSITIVE Sensitive     CLINDAMYCIN RESISTANT Resistant     RIFAMPIN <=0.5 SENSITIVE Sensitive     Inducible Clindamycin POSITIVE Resistant     * >=100,000 COLONIES/mL STAPHYLOCOCCUS HAEMOLYTICUS  Culture, blood (Routine X 2) w Reflex to ID Panel     Status: None   Collection Time: 12/19/21  6:30 AM   Specimen: BLOOD  Result Value Ref Range Status   Specimen Description BLOOD RIGHT ANTECUBITAL  Final   Special Requests   Final    BOTTLES DRAWN AEROBIC AND ANAEROBIC Blood Culture adequate volume   Culture   Final    NO GROWTH 5 DAYS Performed at [facility], [address], [place], [place] [address]    Report Status 12/24/2021 FINAL  Final  Culture, blood (Routine X 2) w Reflex to ID Panel     Status: None   Collection Time: 12/19/21  6:30 AM   Specimen: BLOOD RIGHT ARM  Result Value Ref Range Status   Specimen Description BLOOD RIGHT ARM  Final   Special Requests   Final    BOTTLES DRAWN AEROBIC AND ANAEROBIC Blood Culture adequate volume   Culture   Final    NO GROWTH 5 DAYS Performed at [facility], [place] [address] [place], [place] [address]    Report Status 12/24/2021 FINAL  Final     Time coordinating discharge: Over 30 minutes  SIGNED:   Little Ishikawa, DO [facility] 12/25/2021, 12:05 PM Pager   If 7PM-7AM, please contact night-coverage [email or web address]

## 2021-12-25 NOTE — Progress Notes (Signed)
Per MD order,  41 cm double Lumen Peripherally Inserted Central Catheter removed from right brachial, tip intact. No sutures present. RN confirmed length per chart. Dressing was clean and dry. Petroleum dressing applied. Pt son advised no heavy lifting with this arm, leave dressing for 24 hours seek emergent care if dressing becomes soaked with blood or swelling or sharp pain presents. Son verbalized understanding. Stated they are going home with hospice/palliative care.   Koi Zangara Lorita Officer, RN

## 2021-12-25 NOTE — TOC Transition Note (Signed)
Transition of Care [facility]) - CM/SW Discharge Note   Patient Details  Name: Spencer Ross MRN: [id number] Date of Birth: 1950/06/07  Transition of Care [facility]) CM/SW Contact:  Pollie Friar, RN Phone Number: 12/25/2021, 12:40 PM   Clinical Narrative:    Pt is discharging home with hospice services through [facility]. Pt is transporting home via [facility]. Son at the bedside and is aware. Comfort medications sent to [facility] for family to pick up.    Final next level of care: Home w Hospice Care Barriers to Discharge: No Barriers Identified   Patient Goals and CMS Choice   CMS Medicare.gov Compare Post Acute Care list provided to:: Patient Represenative (must comment) Choice offered to / list presented to : Spouse  Discharge Placement                       Discharge Plan and Services   Discharge Planning Services: CM Consult Post Acute Care Choice: Hospice                               Social Determinants of Health (SDOH) Interventions     Readmission Risk Interventions     No data to display

## 2021-12-28 LAB — CULTURE, FUNGUS WITHOUT SMEAR

## 2022-01-05 LAB — FUNGUS CULTURE WITH STAIN

## 2022-01-05 LAB — FUNGAL ORGANISM REFLEX

## 2022-01-05 LAB — FUNGUS CULTURE RESULT

## 2022-01-26 DEATH — deceased

## 2023-08-09 ENCOUNTER — Other Ambulatory Visit (HOSPITAL_COMMUNITY): Payer: Self-pay

## 2023-11-09 IMAGING — MR MR ABDOMEN WO/W CM
12 of 19 series · 26 of 48 positions shown · IV contrast (18ml multihance)
Comparison: Abdominopelvic CT 04/10/2021.

CLINICAL DATA: Indeterminate hepatic and renal lesions on abdominal
CT. Prostate cancer.

EXAM:
MRI ABDOMEN WITHOUT AND WITH CONTRAST
TECHNIQUE: Multiplanar multisequence MR imaging of the abdomen was performed
both before and after the administration of intravenous contrast.
CONTRAST:  18mL MULTIHANCE GADOBENATE DIMEGLUMINE 529 MG/ML IV SOLN

[Series 3: cor haste · coronal · 5.0mm · 0.68mm/px · 2 of 37 slices shown]
[im 1/37]
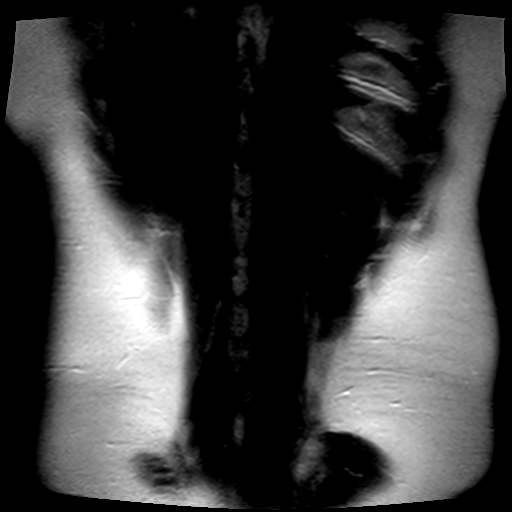
[im 37/37]
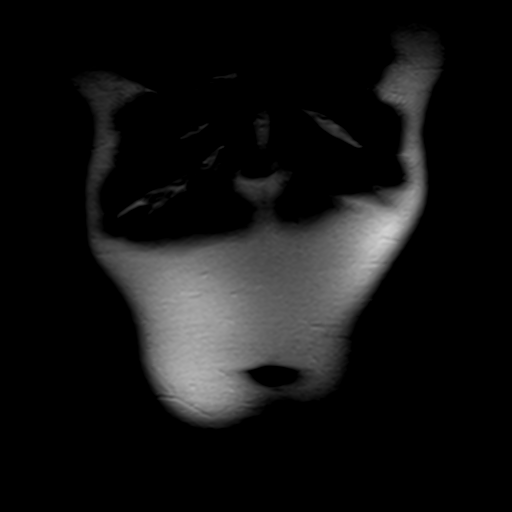

[Series 4: axial haste · axial · 6.0mm · 0.72mm/px · z∈[-77,+134]mm · 2 of 33 slices shown]
[im 1/33]
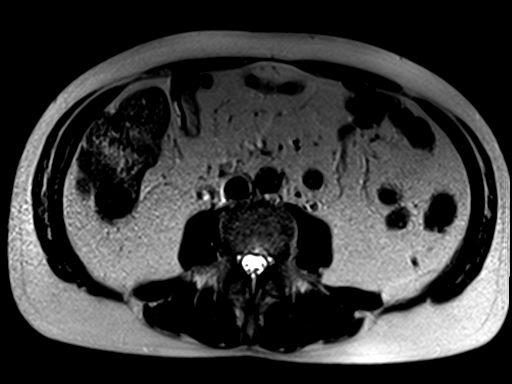
[im 33/33]
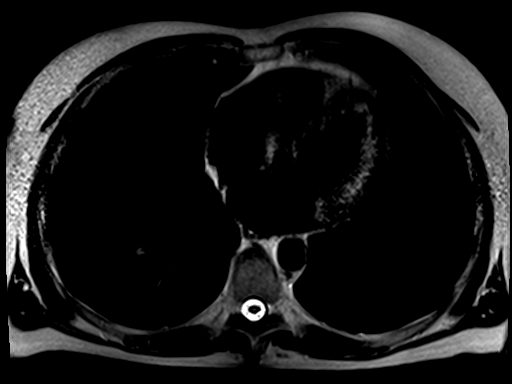

[Series 5: T1 · axial · 6.0mm · 0.72mm/px · z∈[-77,+134]mm · 2 of 66 slices shown]
[im 1/66]
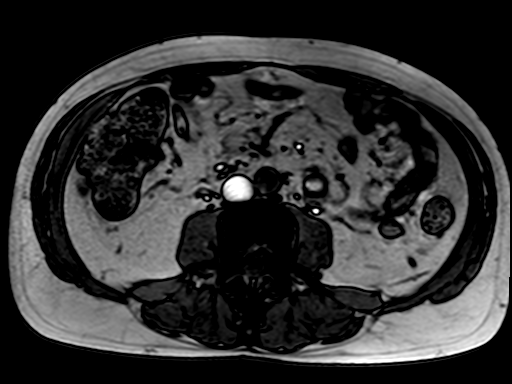
[im 66/66]
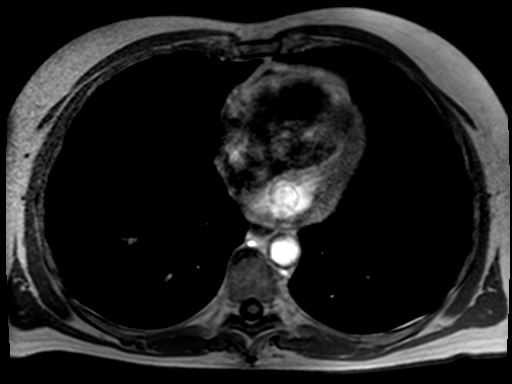

[Series 6: bSSFP · axial · 6.0mm · 0.72mm/px · 1 of 33 slices shown]
[im 1/33]
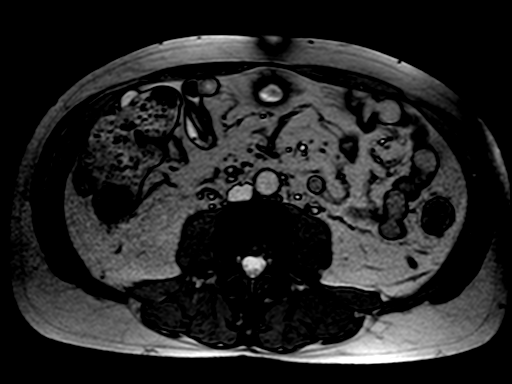

[Series 7: T2 fat-sat · axial · 6.0mm · 1.16mm/px · 1 of 32 slices shown]
[im 1/32]
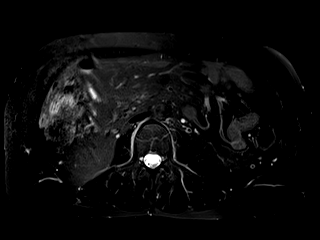

[Series 8: ep2d_[id number]_[id number]_trig · axial · 6.0mm · 1.93mm/px · z∈[-50,+174]mm · 3 of 96 slices shown]
[im 1/96]
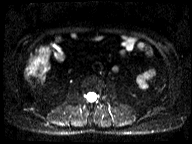
[im 48/96]
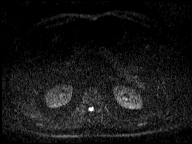
[im 96/96]
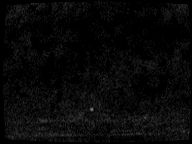

[Series 9: [id number]_[id number]_trig_adc · axial · 6.0mm · 1.93mm/px · 1 of 32 slices shown]
[im 1/32]
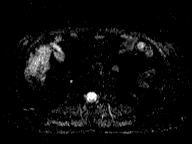

[Series 10: T1 dynamic · axial · non-contrast · 2.5mm · 0.74mm/px · z∈[-85,+152]mm · 3 of 96 slices shown]
[im 1/96]
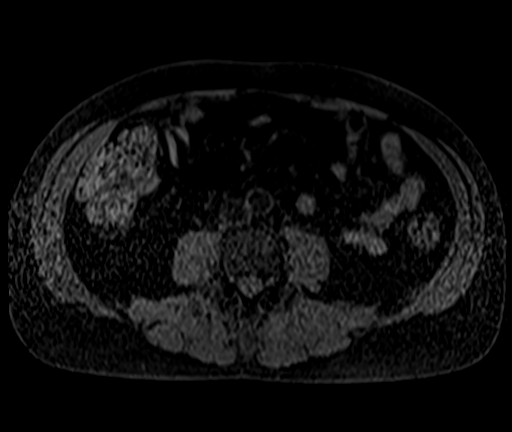
[im 48/96]
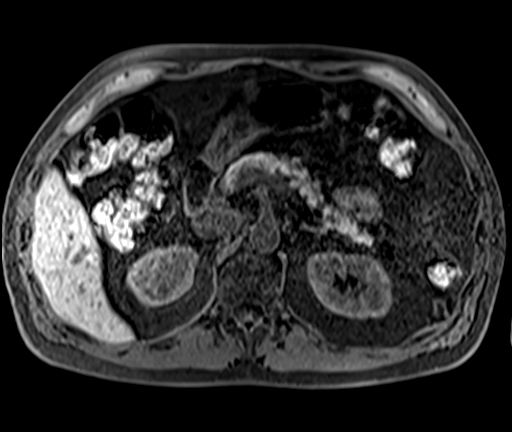
[im 96/96]
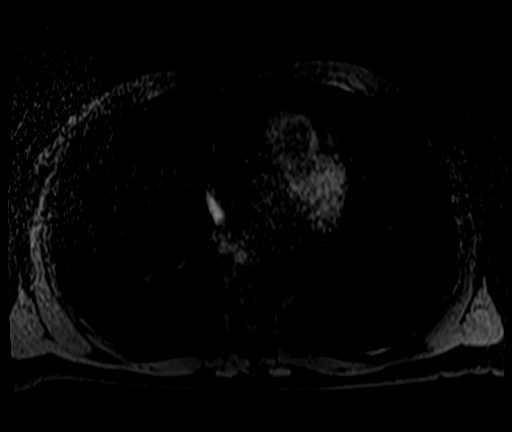

[Series 11: T1 dynamic post-contrast · axial · 2.5mm · 0.74mm/px · z∈[-85,+152]mm · 3 of 96 slices shown (1 of 4)]
[im 1/96]
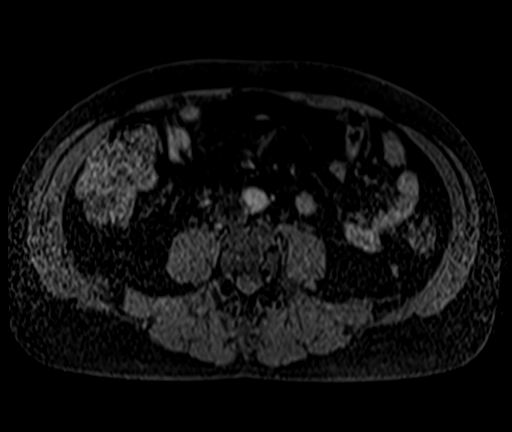
[im 48/96]
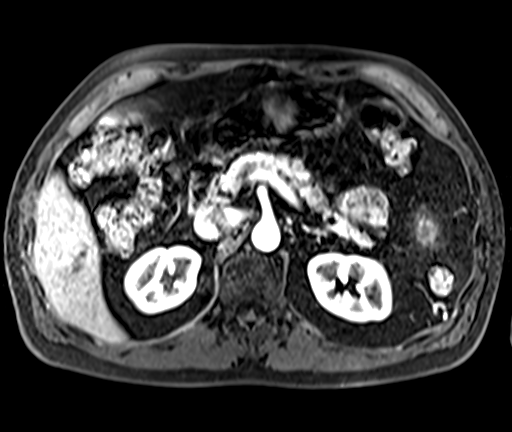
[im 96/96]
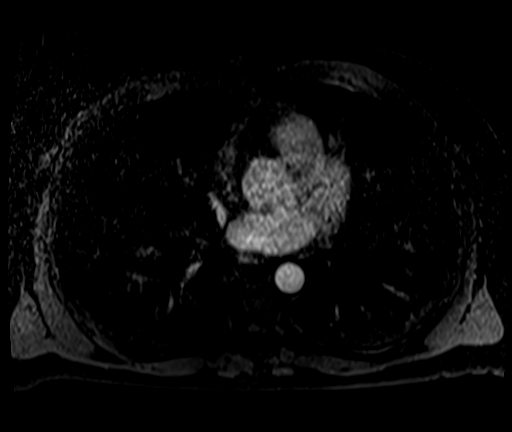

[Series 12: T1 dynamic post-contrast · axial · 2.5mm · 0.74mm/px · z∈[-85,+152]mm · 3 of 96 slices shown (2 of 4)]
[im 1/96]
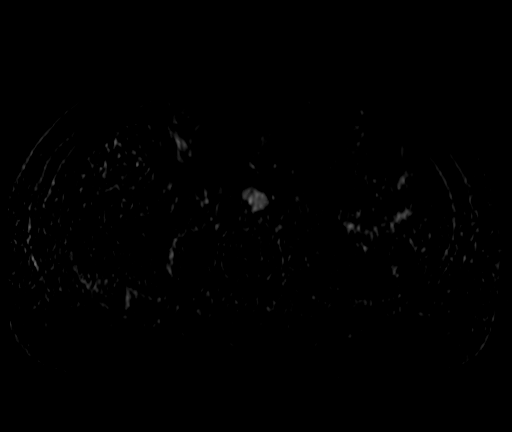
[im 48/96]
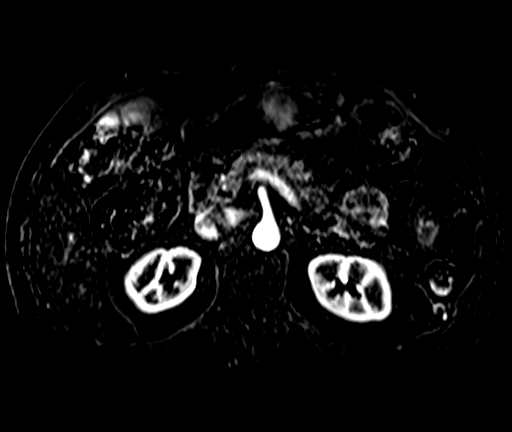
[im 96/96]
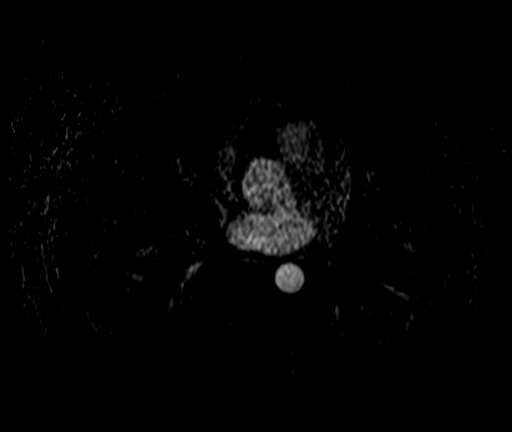

[Series 13: T1 dynamic post-contrast · axial · 2.5mm · 0.74mm/px · z∈[-85,+152]mm · 3 of 96 slices shown (3 of 4)]
[im 1/96]
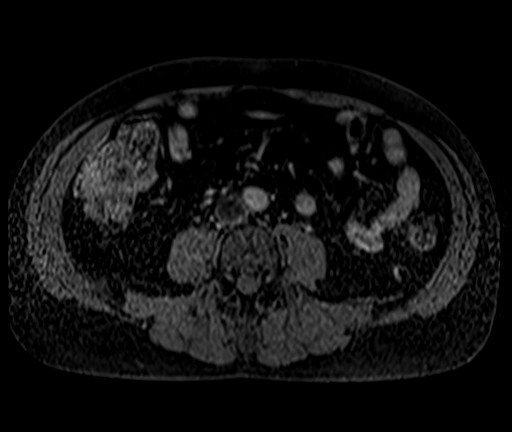
[im 48/96]
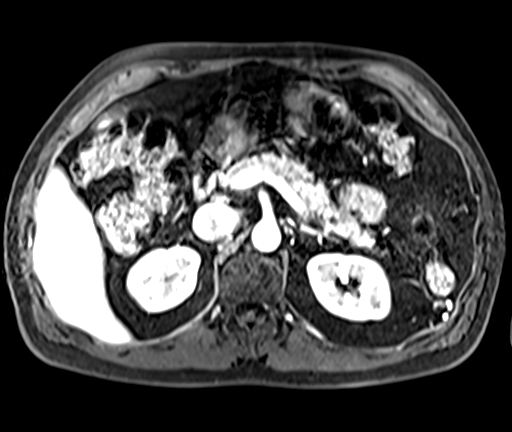
[im 96/96]
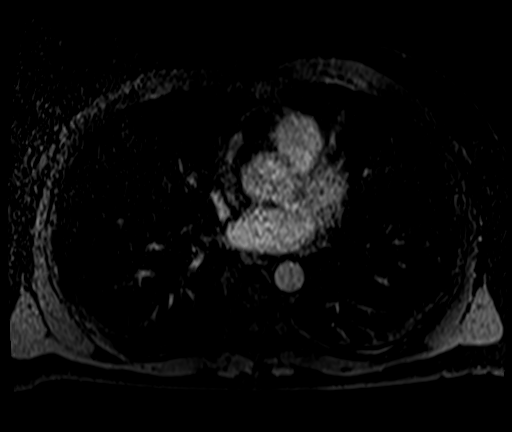

[Series 14: T1 dynamic post-contrast · axial · 2.5mm · 0.74mm/px · z∈[-85,+32]mm · 2 of 96 slices shown (4 of 4)]
[im 1/96]
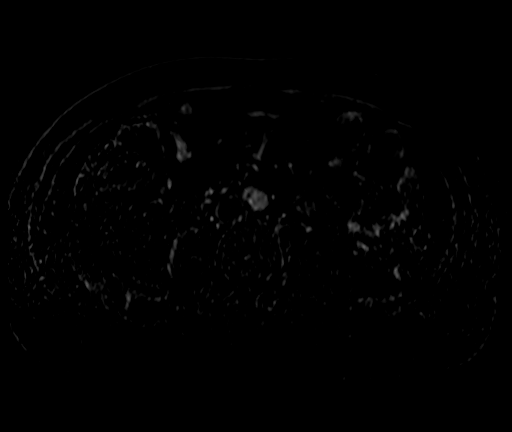
[im 48/96]
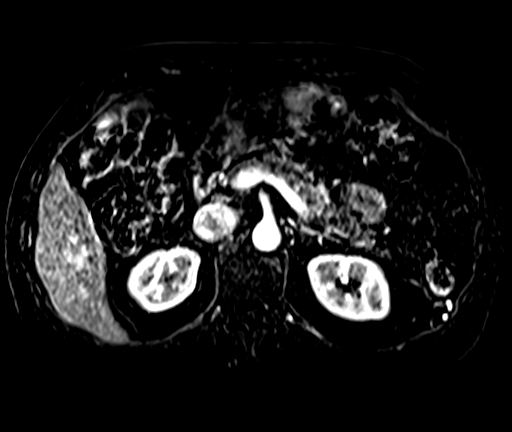

[26 of 48 positions shown; findings below may reference images not displayed]

FINDINGS: Lower chest:  The visualized lower chest appears unremarkable.

Hepatobiliary: No morphologic changes of cirrhosis or significant
hepatic steatosis. Lesion of concern posteriorly in the right
hepatic lobe measures 10 mm on image [DATE] and shows T2 hyperintensity
and low T1 signal. Following contrast, this demonstrates progressive
peripheral enhancement and is fully opacified on the delayed images,
consistent with a hemangioma. No suspicious hepatic lesions. No
evidence of gallstones, gallbladder wall thickening or biliary
dilatation.

Pancreas: Unremarkable. No pancreatic ductal dilatation or
surrounding inflammatory changes.

Spleen: Normal in size without focal abnormality.

Adrenals/Urinary Tract: Both adrenal glands appear normal. The
lesion of concern in the lower pole of the left kidney is a cyst
which measures 1.9 x 1.4 cm on image [DATE]. This demonstrates T2
hyperintensity and no enhancement following contrast. There are
additional subcentimeter cysts bilaterally which do not require
follow-up. No evidence of enhancing renal mass or hydronephrosis.
Bladder not imaged.

Stomach/Bowel: The stomach appears unremarkable for its degree of
distension. No evidence of bowel wall thickening, distention or
surrounding inflammatory change.

Vascular/Lymphatic: There are no enlarged abdominal lymph nodes. No
significant vascular findings.

Other: No evidence of abdominal wall hernia or ascites.

Musculoskeletal: No acute or significant osseous findings. Mild
lower lumbar spondylosis.
IMPRESSION: 1. Benign hemangioma posteriorly in the right hepatic lobe.
2. Small simple cysts (Bosniak 1) in both kidneys, not requiring
follow-up.
3. No acute findings or evidence of metastatic disease.
